# Patient Record
Sex: Female | Born: 1980
Health system: Southern US, Community
[De-identification: ages and names within clinical notes are randomized; demographics above are authoritative.]

## PROBLEM LIST (undated history)

## (undated) DIAGNOSIS — F4310 Post-traumatic stress disorder, unspecified: Secondary | ICD-10-CM

## (undated) DIAGNOSIS — K219 Gastro-esophageal reflux disease without esophagitis: Secondary | ICD-10-CM

## (undated) DIAGNOSIS — K5792 Diverticulitis of intestine, part unspecified, without perforation or abscess without bleeding: Secondary | ICD-10-CM

## (undated) DIAGNOSIS — F509 Eating disorder, unspecified: Secondary | ICD-10-CM

## (undated) DIAGNOSIS — F329 Major depressive disorder, single episode, unspecified: Secondary | ICD-10-CM

## (undated) DIAGNOSIS — F319 Bipolar disorder, unspecified: Secondary | ICD-10-CM

## (undated) DIAGNOSIS — F32A Depression, unspecified: Secondary | ICD-10-CM

## (undated) DIAGNOSIS — I10 Essential (primary) hypertension: Secondary | ICD-10-CM

## (undated) DIAGNOSIS — B019 Varicella without complication: Secondary | ICD-10-CM

## (undated) DIAGNOSIS — E119 Type 2 diabetes mellitus without complications: Secondary | ICD-10-CM

## (undated) DIAGNOSIS — A630 Anogenital (venereal) warts: Secondary | ICD-10-CM

## (undated) HISTORY — DX: Bipolar disorder, unspecified: F31.9

## (undated) HISTORY — DX: Major depressive disorder, single episode, unspecified: F32.9

## (undated) HISTORY — PX: TONSILLECTOMY AND ADENOIDECTOMY: SHX28

## (undated) HISTORY — DX: Type 2 diabetes mellitus without complications: E11.9

## (undated) HISTORY — DX: Eating disorder, unspecified: F50.9

## (undated) HISTORY — DX: Gastro-esophageal reflux disease without esophagitis: K21.9

## (undated) HISTORY — DX: Depression, unspecified: F32.A

## (undated) HISTORY — PX: WISDOM TOOTH EXTRACTION: SHX21

## (undated) HISTORY — DX: Essential (primary) hypertension: I10

## (undated) HISTORY — DX: Post-traumatic stress disorder, unspecified: F43.10

## (undated) HISTORY — DX: Varicella without complication: B01.9

## (undated) HISTORY — DX: Anogenital (venereal) warts: A63.0

## (undated) HISTORY — DX: Diverticulitis of intestine, part unspecified, without perforation or abscess without bleeding: K57.92

---

## 2003-05-19 ENCOUNTER — Other Ambulatory Visit: Payer: Self-pay

## 2008-01-20 ENCOUNTER — Emergency Department: Payer: Self-pay | Admitting: Emergency Medicine

## 2008-10-06 ENCOUNTER — Encounter: Payer: Self-pay | Admitting: Obstetrics & Gynecology

## 2008-10-06 ENCOUNTER — Ambulatory Visit: Payer: Self-pay | Admitting: Obstetrics & Gynecology

## 2008-10-06 LAB — CONVERTED CEMR LAB
Antibody Screen: NEGATIVE
Eosinophils Absolute: 0.1 10*3/uL (ref 0.0–0.7)
Eosinophils Relative: 1 % (ref 0–5)
HCT: 39.3 % (ref 36.0–46.0)
Hepatitis B Surface Ag: NEGATIVE
Lymphocytes Relative: 25 % (ref 12–46)
MCV: 87.9 fL (ref 78.0–100.0)
Monocytes Relative: 6 % (ref 3–12)
RBC: 4.47 M/uL (ref 3.87–5.11)
RDW: 12.9 % (ref 11.5–15.5)
WBC: 8 10*3/uL (ref 4.0–10.5)

## 2008-10-07 ENCOUNTER — Ambulatory Visit: Payer: Self-pay | Admitting: Obstetrics & Gynecology

## 2008-10-07 ENCOUNTER — Encounter: Payer: Self-pay | Admitting: Obstetrics & Gynecology

## 2008-10-12 ENCOUNTER — Encounter: Payer: Self-pay | Admitting: Obstetrics & Gynecology

## 2008-10-21 ENCOUNTER — Ambulatory Visit: Payer: Self-pay | Admitting: Obstetrics & Gynecology

## 2008-10-21 ENCOUNTER — Ambulatory Visit (HOSPITAL_COMMUNITY): Admission: RE | Admit: 2008-10-21 | Discharge: 2008-10-21 | Payer: Self-pay | Admitting: Obstetrics & Gynecology

## 2008-10-25 ENCOUNTER — Encounter: Payer: Self-pay | Admitting: Obstetrics & Gynecology

## 2008-10-25 ENCOUNTER — Ambulatory Visit: Payer: Self-pay | Admitting: Family Medicine

## 2008-10-25 LAB — CONVERTED CEMR LAB
ALT: 26 units/L (ref 0–35)
AST: 14 units/L (ref 0–37)
Albumin: 4.3 g/dL (ref 3.5–5.2)
Alkaline Phosphatase: 42 units/L (ref 39–117)
BUN: 8 mg/dL (ref 6–23)
CO2: 19 meq/L (ref 19–32)
Calcium: 9.5 mg/dL (ref 8.4–10.5)
Collection Interval-CRCL: 24 hr
Creatinine 24 HR UR: 1721 mg/24hr (ref 700–1800)
Creatinine Clearance: 210 mL/min — ABNORMAL HIGH (ref 75–115)
Creatinine, Ser: 0.57 mg/dL (ref 0.40–1.20)
Creatinine, Urine: 98.3 mg/dL
Glucose, Bld: 170 mg/dL — ABNORMAL HIGH (ref 70–99)
Hemoglobin: 13.1 g/dL (ref 12.0–15.0)
LDH: 134 units/L (ref 94–250)
MCHC: 33 g/dL (ref 30.0–36.0)
Potassium: 4.2 meq/L (ref 3.5–5.3)
Protein, Ur: 53 mg/24hr (ref 50–100)
Total Bilirubin: 0.3 mg/dL (ref 0.3–1.2)
Total Protein: 7.1 g/dL (ref 6.0–8.3)
Uric Acid, Serum: 4.3 mg/dL (ref 2.4–7.0)

## 2008-11-22 ENCOUNTER — Encounter: Admission: RE | Admit: 2008-11-22 | Discharge: 2008-11-22 | Payer: Self-pay | Admitting: Obstetrics & Gynecology

## 2008-11-22 ENCOUNTER — Ambulatory Visit (HOSPITAL_COMMUNITY): Admission: RE | Admit: 2008-11-22 | Discharge: 2008-11-22 | Payer: Self-pay | Admitting: Obstetrics & Gynecology

## 2008-12-13 ENCOUNTER — Ambulatory Visit (HOSPITAL_COMMUNITY): Admission: RE | Admit: 2008-12-13 | Discharge: 2008-12-13 | Payer: Self-pay | Admitting: Obstetrics & Gynecology

## 2008-12-16 ENCOUNTER — Ambulatory Visit: Payer: Self-pay | Admitting: Obstetrics and Gynecology

## 2009-01-03 ENCOUNTER — Ambulatory Visit (HOSPITAL_COMMUNITY): Admission: RE | Admit: 2009-01-03 | Discharge: 2009-01-03 | Payer: Self-pay | Admitting: Obstetrics & Gynecology

## 2009-01-13 ENCOUNTER — Ambulatory Visit: Payer: Self-pay | Admitting: Obstetrics and Gynecology

## 2009-01-31 ENCOUNTER — Ambulatory Visit (HOSPITAL_COMMUNITY): Admission: RE | Admit: 2009-01-31 | Discharge: 2009-01-31 | Payer: Self-pay | Admitting: Obstetrics & Gynecology

## 2009-02-10 ENCOUNTER — Ambulatory Visit: Payer: Self-pay | Admitting: Obstetrics & Gynecology

## 2009-02-17 ENCOUNTER — Encounter: Payer: Self-pay | Admitting: Obstetrics & Gynecology

## 2009-02-17 ENCOUNTER — Ambulatory Visit: Payer: Self-pay | Admitting: Obstetrics & Gynecology

## 2009-02-17 LAB — CONVERTED CEMR LAB: Glucose, Bld: 78 mg/dL (ref 70–99)

## 2009-02-28 ENCOUNTER — Ambulatory Visit: Payer: Self-pay | Admitting: Obstetrics and Gynecology

## 2009-02-28 ENCOUNTER — Ambulatory Visit (HOSPITAL_COMMUNITY): Admission: RE | Admit: 2009-02-28 | Discharge: 2009-02-28 | Payer: Self-pay | Admitting: Obstetrics & Gynecology

## 2009-03-02 ENCOUNTER — Ambulatory Visit: Payer: Self-pay | Admitting: Obstetrics & Gynecology

## 2009-03-21 ENCOUNTER — Ambulatory Visit: Payer: Self-pay | Admitting: Family Medicine

## 2009-03-21 LAB — CONVERTED CEMR LAB
Hemoglobin: 13 g/dL (ref 12.0–15.0)
Platelets: 256 10*3/uL (ref 150–400)
RBC: 4.32 M/uL (ref 3.87–5.11)
RDW: 13 % (ref 11.5–15.5)
WBC: 8.8 10*3/uL (ref 4.0–10.5)

## 2009-03-23 ENCOUNTER — Ambulatory Visit: Payer: Self-pay | Admitting: Obstetrics and Gynecology

## 2009-03-28 ENCOUNTER — Ambulatory Visit (HOSPITAL_COMMUNITY): Admission: RE | Admit: 2009-03-28 | Discharge: 2009-03-28 | Payer: Self-pay | Admitting: Obstetrics & Gynecology

## 2009-03-29 ENCOUNTER — Ambulatory Visit: Payer: Self-pay | Admitting: Family Medicine

## 2009-04-05 ENCOUNTER — Ambulatory Visit: Payer: Self-pay | Admitting: Obstetrics & Gynecology

## 2009-04-07 ENCOUNTER — Ambulatory Visit: Payer: Self-pay | Admitting: Obstetrics & Gynecology

## 2009-04-07 LAB — CONVERTED CEMR LAB
Calcium: 9.2 mg/dL (ref 8.4–10.5)
Chloride: 106 meq/L (ref 96–112)
Creatinine 24 HR UR: 2032 mg/24hr — ABNORMAL HIGH (ref 700–1800)
Creatinine Clearance: 288 mL/min — ABNORMAL HIGH (ref 75–115)
Creatinine, Urine: 99.1 mg/dL
HCT: 38 % (ref 36.0–46.0)
Hemoglobin: 12.8 g/dL (ref 12.0–15.0)
MCHC: 33.7 g/dL (ref 30.0–36.0)
Platelets: 226 10*3/uL (ref 150–400)
RBC: 4.26 M/uL (ref 3.87–5.11)
Total Bilirubin: 0.5 mg/dL (ref 0.3–1.2)
Total Protein: 6.6 g/dL (ref 6.0–8.3)
Uric Acid, Serum: 4.9 mg/dL (ref 2.4–7.0)
WBC: 7.3 10*3/uL (ref 4.0–10.5)

## 2009-04-11 ENCOUNTER — Ambulatory Visit: Payer: Self-pay | Admitting: Obstetrics and Gynecology

## 2009-04-19 ENCOUNTER — Ambulatory Visit: Payer: Self-pay | Admitting: Family Medicine

## 2009-04-21 ENCOUNTER — Ambulatory Visit: Payer: Self-pay | Admitting: Obstetrics & Gynecology

## 2009-04-22 ENCOUNTER — Encounter: Payer: Self-pay | Admitting: Obstetrics & Gynecology

## 2009-04-25 ENCOUNTER — Ambulatory Visit (HOSPITAL_COMMUNITY): Admission: RE | Admit: 2009-04-25 | Discharge: 2009-04-25 | Payer: Self-pay | Admitting: Obstetrics & Gynecology

## 2009-05-02 ENCOUNTER — Inpatient Hospital Stay (HOSPITAL_COMMUNITY): Admission: AD | Admit: 2009-05-02 | Discharge: 2009-05-02 | Payer: Self-pay | Admitting: Obstetrics & Gynecology

## 2009-05-02 ENCOUNTER — Ambulatory Visit: Payer: Self-pay | Admitting: Obstetrics and Gynecology

## 2009-05-02 LAB — CONVERTED CEMR LAB: Chlamydia, DNA Probe: NEGATIVE

## 2009-05-03 ENCOUNTER — Encounter: Payer: Self-pay | Admitting: Obstetrics and Gynecology

## 2009-05-04 ENCOUNTER — Ambulatory Visit: Payer: Self-pay | Admitting: Obstetrics and Gynecology

## 2009-05-04 ENCOUNTER — Encounter: Payer: Self-pay | Admitting: Obstetrics & Gynecology

## 2009-05-06 ENCOUNTER — Ambulatory Visit (HOSPITAL_COMMUNITY): Admission: RE | Admit: 2009-05-06 | Discharge: 2009-05-06 | Payer: Self-pay | Admitting: Obstetrics & Gynecology

## 2009-05-09 ENCOUNTER — Ambulatory Visit: Payer: Self-pay | Admitting: Obstetrics and Gynecology

## 2009-05-12 ENCOUNTER — Ambulatory Visit: Payer: Self-pay | Admitting: Family Medicine

## 2009-05-12 ENCOUNTER — Inpatient Hospital Stay (HOSPITAL_COMMUNITY): Admission: AD | Admit: 2009-05-12 | Discharge: 2009-05-14 | Payer: Self-pay | Admitting: Obstetrics and Gynecology

## 2009-07-04 ENCOUNTER — Ambulatory Visit: Admission: RE | Admit: 2009-07-04 | Discharge: 2009-07-04 | Payer: Self-pay | Admitting: Family Medicine

## 2009-07-14 ENCOUNTER — Ambulatory Visit: Payer: Self-pay | Admitting: Obstetrics & Gynecology

## 2010-02-02 ENCOUNTER — Emergency Department: Payer: Self-pay | Admitting: Emergency Medicine

## 2010-06-25 ENCOUNTER — Encounter: Payer: Self-pay | Admitting: Obstetrics & Gynecology

## 2010-09-05 LAB — COMPREHENSIVE METABOLIC PANEL
Albumin: 3.2 g/dL — ABNORMAL LOW (ref 3.5–5.2)
BUN: 10 mg/dL (ref 6–23)
Calcium: 9 mg/dL (ref 8.4–10.5)
Glucose, Bld: 101 mg/dL — ABNORMAL HIGH (ref 70–99)
Potassium: 3.9 mEq/L (ref 3.5–5.1)
Sodium: 132 mEq/L — ABNORMAL LOW (ref 135–145)
Total Protein: 6.6 g/dL (ref 6.0–8.3)

## 2010-09-05 LAB — URINALYSIS, DIPSTICK ONLY
Bilirubin Urine: NEGATIVE
Ketones, ur: NEGATIVE mg/dL
Leukocytes, UA: NEGATIVE
Nitrite: NEGATIVE
Specific Gravity, Urine: 1.03 (ref 1.005–1.030)
Urobilinogen, UA: 0.2 mg/dL (ref 0.0–1.0)
pH: 5.5 (ref 5.0–8.0)

## 2010-09-05 LAB — URIC ACID: Uric Acid, Serum: 5.6 mg/dL (ref 2.4–7.0)

## 2010-09-05 LAB — CBC
HCT: 41.8 % (ref 36.0–46.0)
MCV: 90.6 fL (ref 78.0–100.0)
RBC: 4.62 MIL/uL (ref 3.87–5.11)
RDW: 12.5 % (ref 11.5–15.5)
WBC: 10.1 10*3/uL (ref 4.0–10.5)

## 2010-09-05 LAB — RPR: RPR Ser Ql: NONREACTIVE

## 2010-09-05 LAB — GLUCOSE, CAPILLARY

## 2010-09-06 LAB — COMPREHENSIVE METABOLIC PANEL
ALT: 26 U/L (ref 0–35)
Alkaline Phosphatase: 93 U/L (ref 39–117)
BUN: 6 mg/dL (ref 6–23)
CO2: 21 mEq/L (ref 19–32)
Chloride: 108 mEq/L (ref 96–112)
Glucose, Bld: 84 mg/dL (ref 70–99)
Potassium: 3.9 mEq/L (ref 3.5–5.1)
Sodium: 136 mEq/L (ref 135–145)
Total Bilirubin: 0.2 mg/dL — ABNORMAL LOW (ref 0.3–1.2)
Total Protein: 6.4 g/dL (ref 6.0–8.3)

## 2010-09-06 LAB — URINALYSIS, ROUTINE W REFLEX MICROSCOPIC
Glucose, UA: NEGATIVE mg/dL
Nitrite: NEGATIVE
Specific Gravity, Urine: 1.01 (ref 1.005–1.030)
pH: 7 (ref 5.0–8.0)

## 2010-09-06 LAB — LACTATE DEHYDROGENASE: LDH: 124 U/L (ref 94–250)

## 2010-09-06 LAB — CBC
HCT: 39.1 % (ref 36.0–46.0)
Hemoglobin: 13.6 g/dL (ref 12.0–15.0)
RBC: 4.32 MIL/uL (ref 3.87–5.11)
WBC: 11 10*3/uL — ABNORMAL HIGH (ref 4.0–10.5)

## 2010-10-17 NOTE — Assessment & Plan Note (Signed)
Caitlin Gomez, SOLIZ               ACCOUNT NO.:  [id number]   MEDICAL RECORD NO.:  [id number]          PATIENT TYPE:  POB   LOCATION:  [facility] at [facility]         FACILITY:  [facility]   PHYSICIAN:  Scheryl Darter, MD       DATE OF BIRTH:  06-15-1980   DATE OF SERVICE:                                  CLINIC NOTE   The patient is postpartum from a vaginal delivery at 88 weeks on  July 13, 2009.  Infant is a female, 6 pounds 6 ounces.  The patient  was gestational diabetic.  She has been using a spermicide as a birth  control method.  She is not sure what other method, she would like to  use.  She has history of chronic hypertension.  She has some irregular  bleeding.  She is currently breast-feeding and working to increase her  milk production.  Infant is receiving some supplementation with formula.  She also reports some tingling and numbness in her back near where the  epidural was placed.  She says this started several weeks after  delivery.  Pain does not radiate.   PHYSICAL EXAMINATION:  BACK:  No redness or tenderness in her back.  ABDOMEN:  Soft, nontender, no mass.  PELVIC:  External genitalia, vagina, and cervix appeared normal.  Uterus  normal size.  No adnexal masses or tenderness.   IMPRESSION:  1. A 9 weeks postpartum.  2. Gestational diabetes.  3. History of chronic hypertension.   PLAN:  She will return for 2 hour 75 g load with good tolerance test.  She will need to be followed by primary care physician for her blood  pressure.  If she decides she would a like prescription contraceptives,  she can notify us.  In the meantime, I would recommend she use condoms  with spermicide.      Scheryl Darter, MD     JA/MEDQ  D:  07/14/2009  T:  07/15/2009  Job:  [id number]

## 2011-02-04 IMAGING — US US OB FOLLOW-UP
1 series · 14 of 26 positions shown · non-contrast
Comparison: none

OBSTETRICAL ULTRASOUND:
 This ultrasound was performed in The [HOSPITAL], and the [facility] report will be stored to [REDACTED] PACS.  This report is also available in [HOSPITAL]?s accessANYware.

[Series 1: us ob follow-up · 26 acquisitions, 14 frames shown]
[im 1/26]
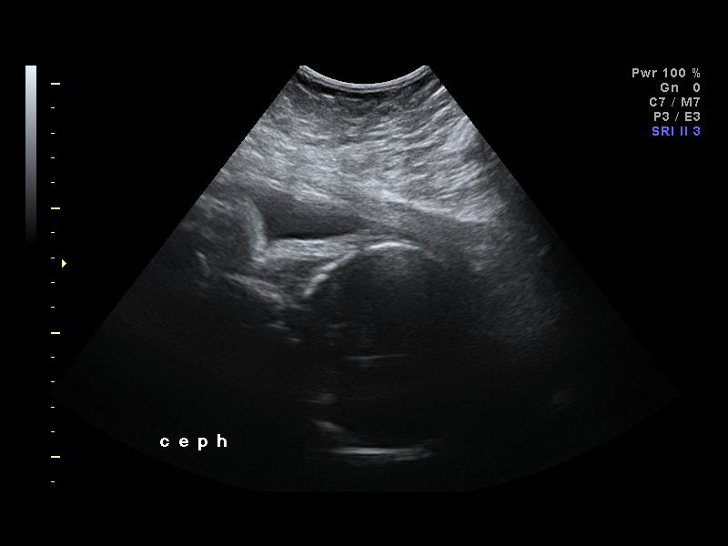
[im 3/26]
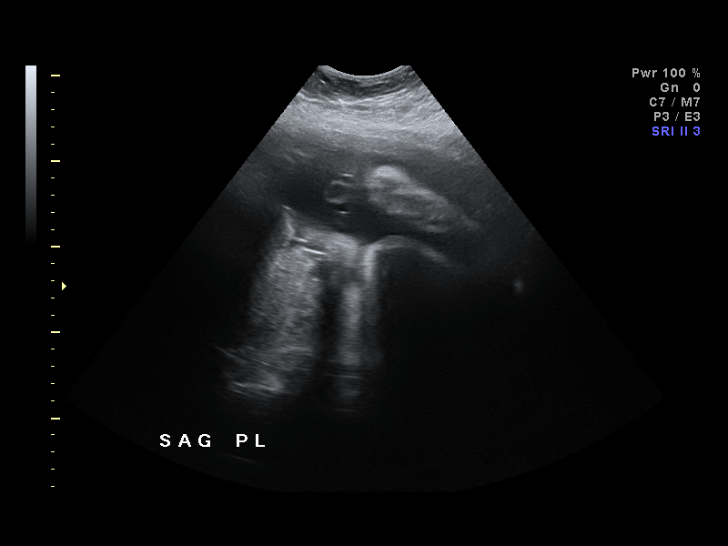
[im 5/26]
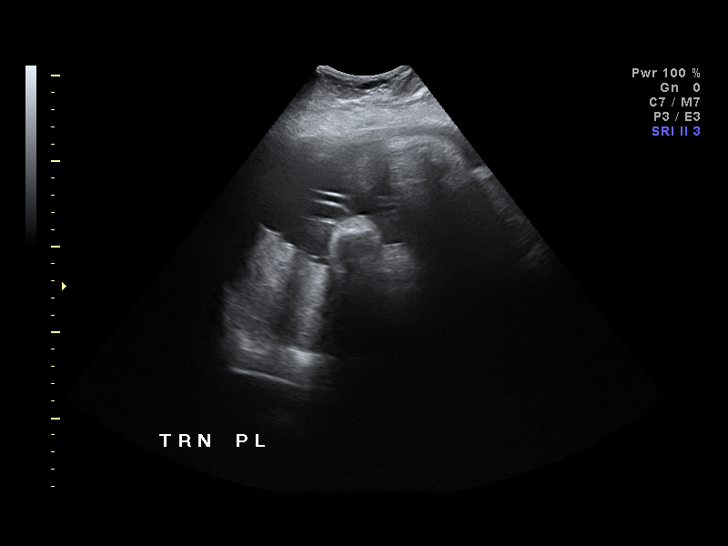
[im 7/26]
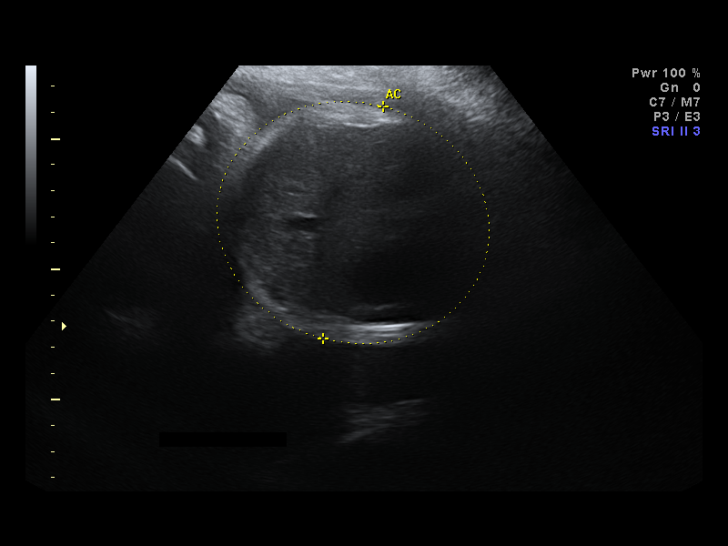
[im 9/26]
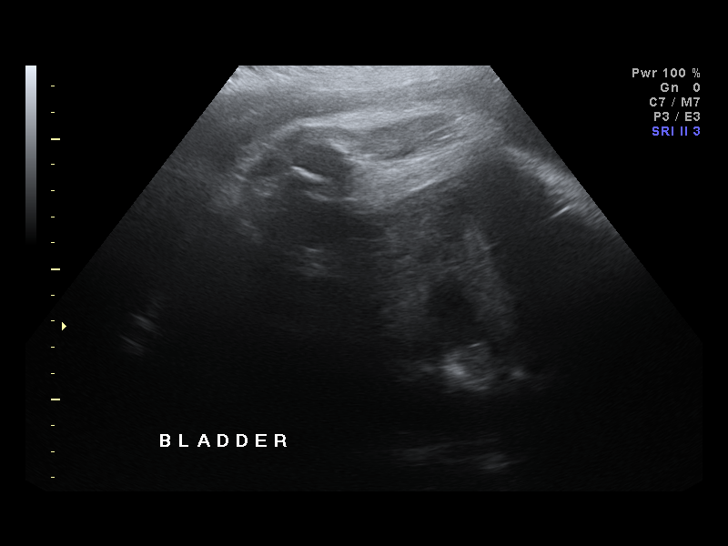
[im 11/26]
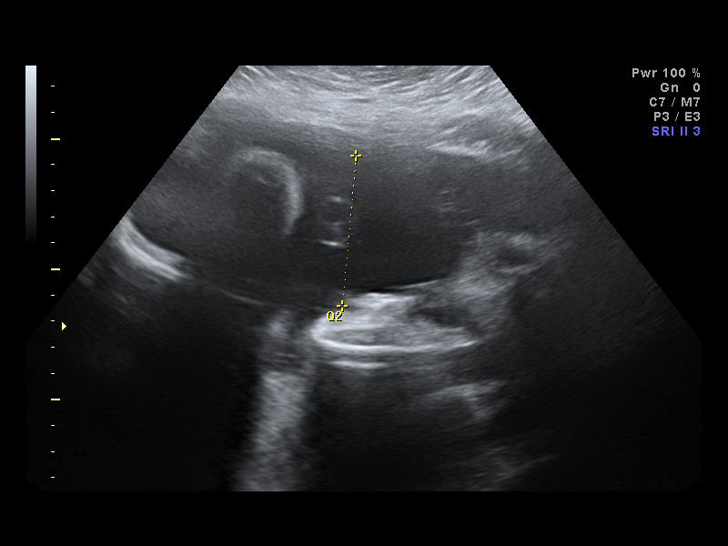
[im 13/26]
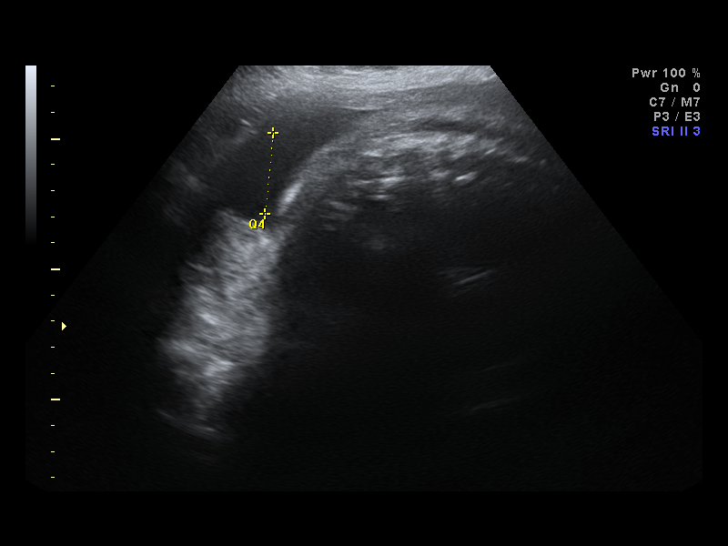
[im 14/26]
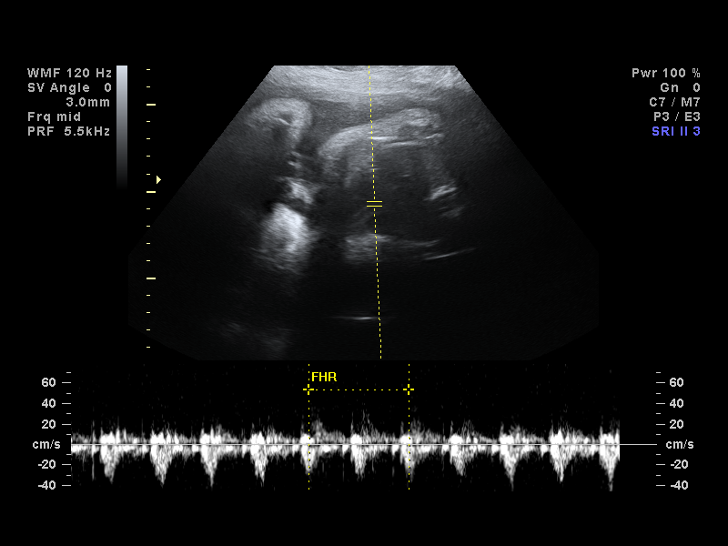
[im 16/26]
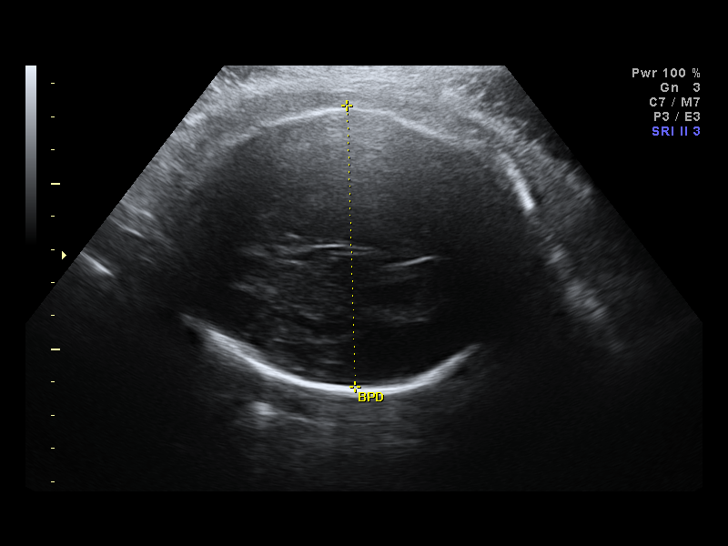
[im 18/26]
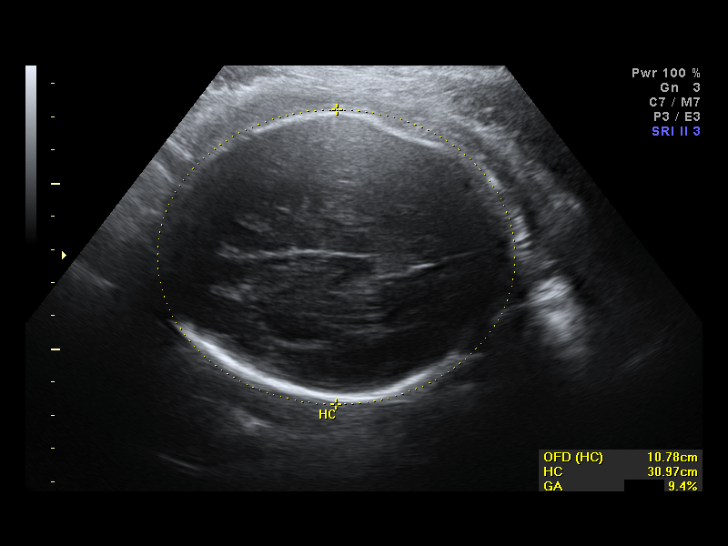
[im 20/26]
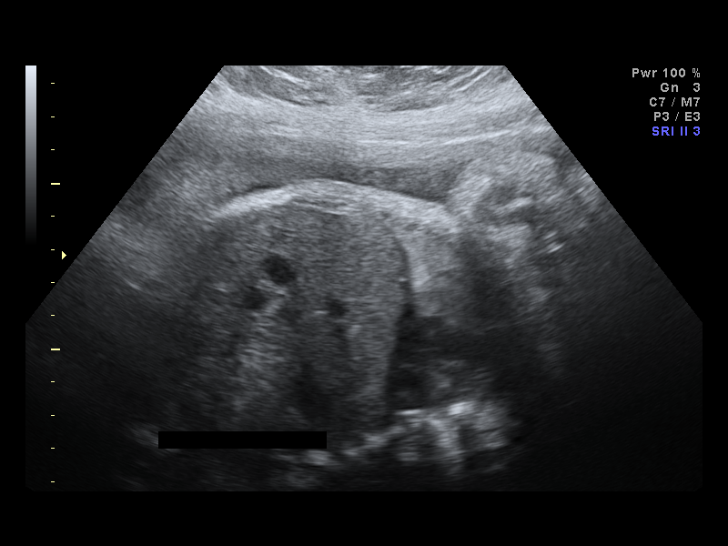
[im 22/26]
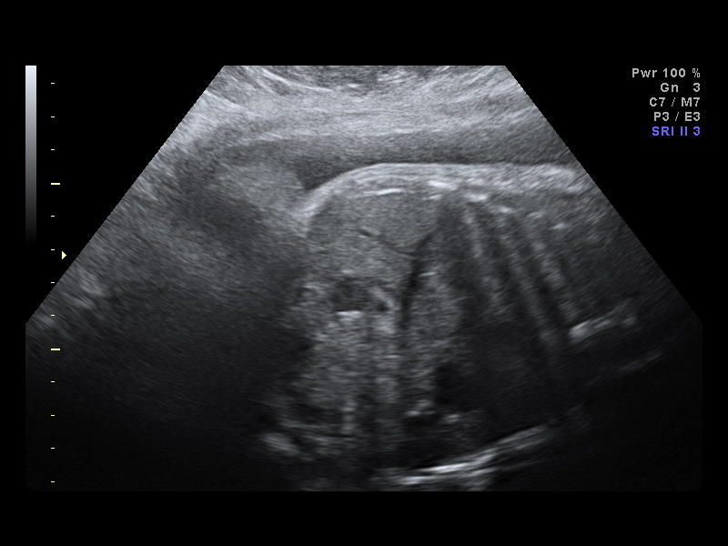
[im 24/26]
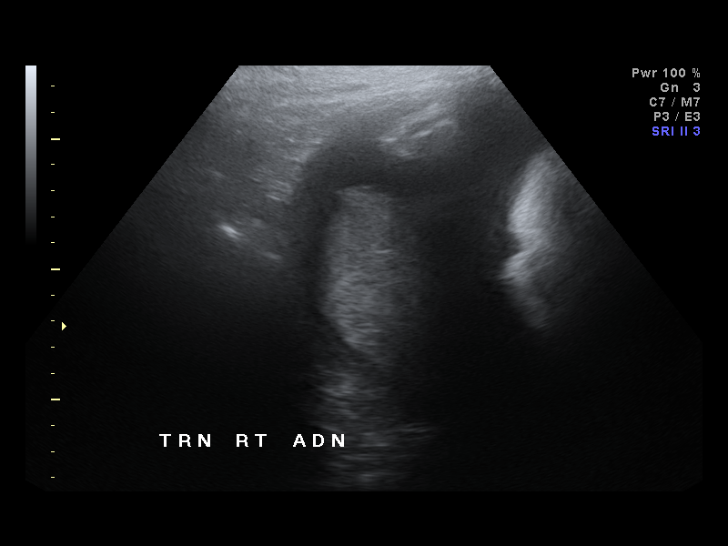
[im 26/26]
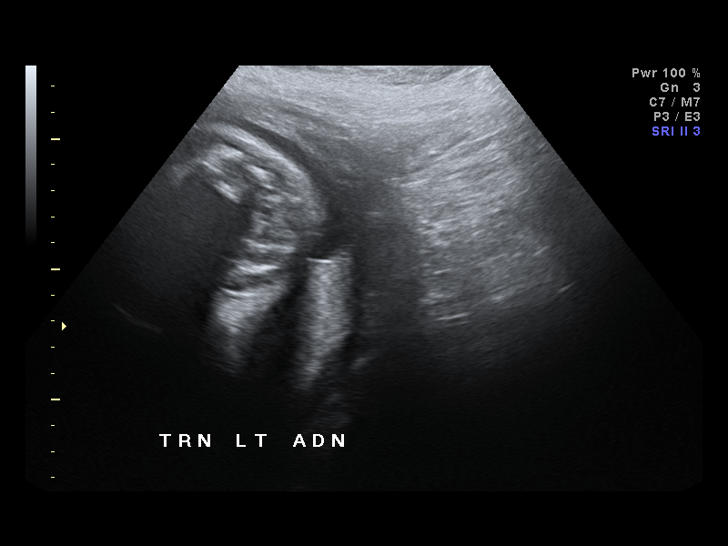

[14 of 26 positions shown; findings below may reference images not displayed]

IMPRESSION: [facility] has also been faxed to the ordering physician.

## 2012-03-06 SURGERY — Surgical Case
Anesthesia: *Unknown

## 2012-11-21 LAB — COMPREHENSIVE METABOLIC PANEL
Albumin: 4.3 g/dL (ref 3.4–5.0)
Alkaline Phosphatase: 55 U/L (ref 50–136)
BUN: 9 mg/dL (ref 7–18)
Calcium, Total: 9.1 mg/dL (ref 8.5–10.1)
Chloride: 105 mmol/L (ref 98–107)
Creatinine: 0.85 mg/dL (ref 0.60–1.30)
Glucose: 188 mg/dL — ABNORMAL HIGH (ref 65–99)
Osmolality: 279 (ref 275–301)
Potassium: 3.5 mmol/L (ref 3.5–5.1)
SGPT (ALT): 32 U/L (ref 12–78)
Sodium: 138 mmol/L (ref 136–145)

## 2012-11-21 LAB — DRUG SCREEN, URINE
Amphetamines, Ur Screen: NEGATIVE (ref ?–1000)
Cannabinoid 50 Ng, Ur ~~LOC~~: NEGATIVE (ref ?–50)
Opiate, Ur Screen: NEGATIVE (ref ?–300)
Phencyclidine (PCP) Ur S: NEGATIVE (ref ?–25)
Tricyclic, Ur Screen: NEGATIVE (ref ?–1000)

## 2012-11-21 LAB — URINALYSIS, COMPLETE
Blood: NEGATIVE
Glucose,UR: NEGATIVE mg/dL (ref 0–75)
Granular Cast: 5
Leukocyte Esterase: NEGATIVE
Nitrite: NEGATIVE
RBC,UR: <1 /HPF (ref 0–5)
Specific Gravity: 1.025 (ref 1.003–1.030)
Squamous Epithelial: <1

## 2012-11-21 LAB — CBC
HCT: 40.9 % (ref 35.0–47.0)
MCH: 29.7 pg (ref 26.0–34.0)
MCHC: 35.4 g/dL (ref 32.0–36.0)
Platelet: 284 10*3/uL (ref 150–440)
WBC: 8.5 10*3/uL (ref 3.6–11.0)

## 2012-11-21 LAB — ETHANOL
Ethanol %: <0.003 % (ref 0.000–0.080)
Ethanol: <3 mg/dL

## 2012-11-21 LAB — TSH: Thyroid Stimulating Horm: 1.14 u[IU]/mL

## 2012-11-22 ENCOUNTER — Inpatient Hospital Stay: Payer: Self-pay | Admitting: Psychiatry

## 2014-09-24 NOTE — Discharge Summary (Signed)
PATIENT NAME:  Caitlin Gomez, Caitlin Gomez MR#:  [id number] DATE OF BIRTH:  07-Mar-1981  DATE OF ADMISSION:  11/22/2012 DATE OF DISCHARGE:  11/25/2012  HOSPITAL COURSE:  See dictated history and physical for details of admission. A 34 year old woman with a history of depressive symptoms and borderline personality disorder features who was admitted to the hospital after presenting to her therapist with suicidal thoughts and thoughts of self-mutilation. In the hospital, the patient did not display any dangerous behavior. She expressed remorse for her behavior and expressed an understanding that her threatening behavior was counterproductive. She was engaged in groups on the unit appropriately. She did not display any dangerous behavior on the ward. The patient showed good insight and appeared to show appropriate response to psychotherapy. Medications were continued with the starter dose of lamotrigine which had been started by her outpatient psychiatrist. No side effects noted. At the time of discharge, the patient's affect was calm and upbeat. She was denying suicidal ideation and was very agreeable to outpatient treatment in the community. No acute dangerousness indicated.   DISCHARGE MEDICATION:  Lamotrigine 25 mg p.o. daily.   LABORATORY RESULTS:  Admission labs showed a chemistry panel with an elevated glucose on a nonfasting draw of 188. Alcohol was undetectable. TSH was normal at 1.14. The drug screen was negative. CBC was unremarkable. Urinalysis was positive for protein.   MENTAL STATUS EXAM AT DISCHARGE:  Neatly dressed and groomed woman looks her stated age. Cooperative with the interview. Good eye contact. Affect euthymic, reactive, appropriate. Mood stated as good. Thoughts lucid with no evidence of loosening of associations. Denies suicidal or homicidal ideation. Denies any hallucinations. Shows improved insight and judgment. Normal intelligence.   DISPOSITION:  She will be discharged with followup with Dr.  Nicolasa Ducking and her therapist, Karen San Marino, in the community.   DIAGNOSIS, PRINCIPAL AND PRIMARY:  AXIS I: Depression, not otherwise specified.   SECONDARY DIAGNOSES: AXIS I: Dysthymia.  AXIS II: Borderline personality disorder.  AXIS III: No diagnosis.  AXIS IV: Severe from chronic illness.  AXIS V: Functioning at time of discharge 57.   ____________________________ Gonzella Lex, MD jtc:si D: 11/25/2012 17:28:16 ET T: 11/25/2012 22:45:15 ET JOB#: [id number]  cc: Gonzella Lex, MD, <Dictator> Gonzella Lex MD ELECTRONICALLY SIGNED 11/26/2012 10:40

## 2014-09-24 NOTE — H&P (Signed)
PATIENT NAME:  Caitlin Gomez, POUSSON MR#:  [id number] DATE OF BIRTH:  08/27/1980  DATE OF ADMISSION:  11/22/2012  PLACE OF DICTATION:  [place], [place], [place].  SEX:  Female.  RACE:  White.  AGE:  34 years.  INITIAL ASSESSMENT:  Psychiatric evaluation.  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  The patient is married for 4 years and lives with her husband who is 30 years old and is a Geophysical data processor.  The patient and husband live in a house along with their child who is a 28-year-old girl.  The patient comes for first inpatient psychiatry at [facility] with chief complaint "Dr. Nicolasa Ducking recommended that I should come here for help."  HISTORY OF PRESENT ILLNESS:  The patient reports that she is being followed by Dr.  Richardson Dopp from November 2006 to March of 2009 and was diagnosed with borderline personality disorder.  In addition, she goes for therapy with Ms. Nira Retort since March of 2014 and currently she gets to see her once a week for an hour for therapy.  She expressed her feelings with the therapist and she has self-cutting behavior since age 48 years and recently she used a razor to cut herself on the left side of her chest.  They discussed her case and Dr. Nicolasa Ducking recommended that she should come here for inpatient evaluation.  The patient reports that whenever she feels stressed out and overwhelmed and she cannot deal with the anger issues of other people, she tries to release her frustration by self-cutting behavior.    PAST PSYCHIATRIC HISTORY:  This is her first inpatient psychiatry.  No history of suicide attempt.  Being followed for borderline personality disorder for many years.    FAMILY HISTORY OF MENTAL ILLNESS:  Mother has depression.  Maternal grandmother committed suicide.    FAMILY HISTORY:  Raised by  parents.  Father is a truck Geophysicist/field seismologist and father is 72 years old.  Mother works at a [employer]..  Mother is 32 years old.  The patient is an only child.  PERSONAL HISTORY:  Born in [place], moved to [place] at age 89 years, graduated from high school, took some college courses.    WORK HISTORY:  First job was at a pharmacy at age 35 years.  The longest job she has held is 3 years at a pharmacy and became a Freight forwarder.  Last worked in 2007 and quit because she had a baby.    MILITARY HISTORY:  None.  MARRIAGES:  Married once.  Married for 4 years.  Husband works as a Geophysical data processor, has one girl who is 32 years old.   ALCOHOL AND DRUGS:  First drink was at 22 yrs used to drink quite a bit of alcohol at age 14 years.  Currently is an occasional drinker of alcohol.  Denies street and prescription drug abuse.  Denies using IV drugs.  Denies smoking nicotine cigarettes.   PAST MEDICAL HISTORY:  Has hypertension which is labile.  No known diabetes mellitus.  Status post tonsillectomy and adenoidectomy at age 36 years.  No history of motor accident.  Never been unconsciousness.    ALLERGIES:  AUGMENTIN.  APPOINTMENTS:  Has an appointment coming up at [facility] on 11/26/2012 for medical help.  PHYSICAL EXAMINATION: VITAL SIGNS:  Temperature 98.4, pulse is 90 per  minute and regular, respirations 20 per minute and regular, blood pressure is 150/90 mmHg. HEENT:  Head is normocephalic, atraumatic.  Eyes, PERRLA.  Fundi bilaterally benign.  EOMs were intact.  Tympanic membranes intact, no exudate. NECK:  Supple without any organomegaly, lymphadenopathy or thyromegaly. CHEST:  Normal expansion.  Normal breath sounds heard.  The patient has superficial abrasions on the left side of her abdomen and chest which are self-inflicted with a razor and they are very superficial.   ABDOMEN:  Rather obese.  Bowel sounds heard.  RECTAL AND PELVIC:  Deferred. NEUROLOGIC:  Gait is normal.  Romberg is  negative.  Cranial nerves II through XII grossly intact.  DTRs 2+ and normal.  Plantars have normal response.  MENTAL STATUS EXAMINATION:  The patient is dressed in hospital scrubs.  Alert and oriented to place, person and time.  Fully aware of situation that brought her for admission to [facility].  Affect is reactive with mood depressed.  Admits feeling low and down.  Admits that she is feeling hopeless and helpless because she is here.  She wishes she was at home with her child.  Denies feeling worthless or useless.  Denies any suicidal or homicidal ideas or plans, as she just gets frustrated when she is overwhelmed and tries to release her frustration and anger by self-mutilation.  No psychosis.  Denies auditory or visual hallucinations.  Denies hearing voices, denies  thought control.  Denies having any grandiose ideas.  Memory is intact for recent and remote events.  Recall is good.  Could spell the word world forward and backward without any problem.  Judgment is intact and for fire she says she will leave. For envelope, she said she would mail it.  Denies any sleep disturbance.  Denies any appetite disturbance.  Insight and judgement guarded.   IMPRESSION:  Depressive disorder, not otherwise specified, borderline personality disorder with low frustration tolerance and when she gets upset she uses self-mutilating behavior.  Exogenous obesity mild.  Labile hypertension.  PLAN:  The patient is admitted to [facility], close observation and management.  She will be started on as needed medications.  During the stay in the hospital, she will be given milieu therapy and supportive counseling with dealing with frustration tolerance and coping skills will be addressed.  At the time of discharge, the patient will have better coping skills and will be referred back to the community for follow-up and help.    ____________________________ Wallace Cullens. Franchot Mimes, MD skc:ea D: 11/22/2012 19:02:50  ET T: 11/22/2012 19:32:00 ET JOB#: [id number]  cc: Arlyn Leak K. Franchot Mimes, MD, <Dictator> Dewain Penning MD ELECTRONICALLY SIGNED 11/23/2012 18:22

## 2015-03-21 ENCOUNTER — Other Ambulatory Visit: Payer: Self-pay | Admitting: Physician Assistant

## 2015-03-21 DIAGNOSIS — R945 Abnormal results of liver function studies: Principal | ICD-10-CM

## 2015-03-21 DIAGNOSIS — R7989 Other specified abnormal findings of blood chemistry: Secondary | ICD-10-CM

## 2015-03-25 ENCOUNTER — Ambulatory Visit
Admission: RE | Admit: 2015-03-25 | Discharge: 2015-03-25 | Disposition: A | Discharge status: Home / Self Care | Payer: BLUE CROSS/BLUE SHIELD | Source: Ambulatory Visit | Attending: Physician Assistant | Admitting: Physician Assistant

## 2015-03-25 DIAGNOSIS — R945 Abnormal results of liver function studies: Secondary | ICD-10-CM

## 2015-03-25 DIAGNOSIS — R7989 Other specified abnormal findings of blood chemistry: Secondary | ICD-10-CM | POA: Diagnosis not present

## 2015-07-14 LAB — HM PAP SMEAR: HM PAP: NEGATIVE

## 2016-03-05 ENCOUNTER — Ambulatory Visit (INDEPENDENT_AMBULATORY_CARE_PROVIDER_SITE_OTHER): Payer: BLUE CROSS/BLUE SHIELD | Admitting: Family

## 2016-03-05 ENCOUNTER — Encounter: Payer: Self-pay | Admitting: Family

## 2016-03-05 ENCOUNTER — Encounter (INDEPENDENT_AMBULATORY_CARE_PROVIDER_SITE_OTHER): Payer: Self-pay

## 2016-03-05 DIAGNOSIS — Z23 Encounter for immunization: Secondary | ICD-10-CM

## 2016-03-05 DIAGNOSIS — F319 Bipolar disorder, unspecified: Secondary | ICD-10-CM | POA: Diagnosis not present

## 2016-03-05 DIAGNOSIS — I10 Essential (primary) hypertension: Secondary | ICD-10-CM

## 2016-03-05 DIAGNOSIS — E119 Type 2 diabetes mellitus without complications: Secondary | ICD-10-CM

## 2016-03-05 DIAGNOSIS — Z7689 Persons encountering health services in other specified circumstances: Secondary | ICD-10-CM | POA: Diagnosis not present

## 2016-03-05 LAB — CBC WITH DIFFERENTIAL/PLATELET
BASOS ABS: 0.1 10*3/uL (ref 0.0–0.1)
Basophils Relative: 0.9 % (ref 0.0–3.0)
EOS ABS: 0.3 10*3/uL (ref 0.0–0.7)
Eosinophils Relative: 3.7 % (ref 0.0–5.0)
HEMATOCRIT: 38.3 % (ref 36.0–46.0)
Hemoglobin: 13.3 g/dL (ref 12.0–15.0)
LYMPHS ABS: 1.7 10*3/uL (ref 0.7–4.0)
LYMPHS PCT: 22.3 % (ref 12.0–46.0)
MCHC: 34.8 g/dL (ref 30.0–36.0)
MCV: 85.7 fl (ref 78.0–100.0)
MONO ABS: 0.5 10*3/uL (ref 0.1–1.0)
Monocytes Relative: 6.6 % (ref 3.0–12.0)
NEUTROS ABS: 5.1 10*3/uL (ref 1.4–7.7)
NEUTROS PCT: 66.5 % (ref 43.0–77.0)
PLATELETS: 252 10*3/uL (ref 150.0–400.0)
RBC: 4.47 Mil/uL (ref 3.87–5.11)
RDW: 12.9 % (ref 11.5–15.5)
WBC: 7.7 10*3/uL (ref 4.0–10.5)

## 2016-03-05 LAB — TSH: TSH: 2.19 u[IU]/mL (ref 0.35–4.50)

## 2016-03-05 LAB — LIPID PANEL
CHOL/HDL RATIO: 6
Cholesterol: 183 mg/dL (ref 0–200)
HDL: 32.9 mg/dL — ABNORMAL LOW (ref >39.00)
LDL CALC: 114 mg/dL — AB (ref 0–99)
NonHDL: 150.48
Triglycerides: 184 mg/dL — ABNORMAL HIGH (ref 0.0–149.0)
VLDL: 36.8 mg/dL (ref 0.0–40.0)

## 2016-03-05 LAB — COMPREHENSIVE METABOLIC PANEL
ALT: 19 U/L (ref 0–35)
AST: 13 U/L (ref 0–37)
Albumin: 3.9 g/dL (ref 3.5–5.2)
Alkaline Phosphatase: 41 U/L (ref 39–117)
BILIRUBIN TOTAL: 0.3 mg/dL (ref 0.2–1.2)
BUN: 9 mg/dL (ref 6–23)
CHLORIDE: 109 meq/L (ref 96–112)
CO2: 22 meq/L (ref 19–32)
CREATININE: 0.77 mg/dL (ref 0.40–1.20)
Calcium: 8.9 mg/dL (ref 8.4–10.5)
GFR: 91.46 mL/min (ref >60.00)
GLUCOSE: 165 mg/dL — AB (ref 70–99)
Potassium: 4.1 mEq/L (ref 3.5–5.1)
SODIUM: 139 meq/L (ref 135–145)
Total Protein: 7.3 g/dL (ref 6.0–8.3)

## 2016-03-05 LAB — VITAMIN D 25 HYDROXY (VIT D DEFICIENCY, FRACTURES): VITD: 19.8 ng/mL — AB (ref 30.00–100.00)

## 2016-03-05 LAB — HEMOGLOBIN A1C: Hgb A1c MFr Bld: 6.8 % — ABNORMAL HIGH (ref 4.6–6.5)

## 2016-03-05 NOTE — Assessment & Plan Note (Signed)
Reviewed past medical, social, family history with patient. We are requesting records from Dr. Corena Pilgrim, psychiatrist, and prior PCP from [place].  Per patient, she's had a physical this year including Pap smear which was normal. She no history of abnormal Pap smears.  Patient politely declined scheduling a CPE this calendar year, screening labs ordered today.

## 2016-03-05 NOTE — Progress Notes (Signed)
Pre visit review using our clinic review tool, if applicable. No additional management support is needed unless otherwise documented below in the visit note. 

## 2016-03-05 NOTE — Assessment & Plan Note (Addendum)
Appears stable. Follows with dr Corena Pilgrim in [place]. Requesting records. Titrating down on topamax and plans to restart lamictal.

## 2016-03-05 NOTE — Progress Notes (Signed)
Subjective:    Patient ID: Caitlin Gomez, female    DOB: 29-Apr-1981, 35 y.o.   MRN: [id number]  CC: Caitlin Gomez is a 35 y.o. female who presents today as new patient.  HPI: Patient presents today as a new patient to our practice. She is here to establish care. Doing well today no complaints.  Prior care had been at the [facility] clinic and we are requesting those records. 'Whole family works at [employer]' which is why she wanted to come here.   DM- last a1c from [place] 7.5. Not checking blood sugars currently at home.   HTN-Can be labile at times; started on medication at 35 years old. No chest pain, palpitations.  Bipolar - Dr. Corena Pilgrim in [place]. Tapering off topamax, had been on lamictal and now going back on. Has gained 10 pounds on topamax, and also noted BP running higher.    Pap 2017, normal.  States she had CPE and CBE this year.          HISTORY:  Past Medical History:  Diagnosis Date  . Chicken pox   . Depression   . Diabetes mellitus without complication ([place])   . Diverticulitis    12/2015  . Eating disorder   . Genital warts   . GERD (gastroesophageal reflux disease)   . Hypertension    Past Surgical History:  Procedure Laterality Date  . TONSILLECTOMY AND ADENOIDECTOMY     Family History  Problem Relation Age of Onset  . Alcohol abuse Mother   . Hyperlipidemia Mother   . Hypertension Mother   . Mental illness Mother   . Diabetes Mother   . Diabetes Father   . Mental illness Paternal Aunt   . Cancer Paternal Aunt   . Early death Maternal Grandmother   . Mental illness Maternal Grandmother   . Diabetes Maternal Grandmother   . Diabetes Maternal Grandfather   . Stroke Paternal Grandfather   . Diabetes Paternal Grandfather     Allergies: Amoxicillin; Ciprofloxacin; Clindamycin; Flagyl [metronidazole]; Ziprasidone hcl; and Amoxicillin-pot clavulanate No current outpatient prescriptions on file prior to visit.   No current  facility-administered medications on file prior to visit.     Social History  Substance Use Topics  . Smoking status: Former Smoker    Types: Cigarettes  . Smokeless tobacco: Never Used  . Alcohol use Yes     Comment: one glass of wine once every 3 months    Review of Systems  Constitutional: Negative for chills and fever.  Eyes: Negative for visual disturbance.  Respiratory: Negative for cough.   Cardiovascular: Negative for chest pain and palpitations.  Gastrointestinal: Negative for nausea and vomiting.  Psychiatric/Behavioral: Negative for agitation and hallucinations.      Objective:    BP (!) 130/100   Pulse 98   Temp 98 F (36.7 C) (Oral)   Ht '5\' 6"'$  (1.676 m)   Wt 263 lb 12.8 oz (119.7 kg)   LMP  (LMP Unknown)   SpO2 99%   BMI 42.58 kg/m  BP Readings from Last 3 Encounters:  03/05/16 (!) 130/100   Wt Readings from Last 3 Encounters:  03/05/16 263 lb 12.8 oz (119.7 kg)    Physical Exam  Constitutional: She appears well-developed and well-nourished.  Eyes: Conjunctivae are normal.  Neck: No thyroid mass and no thyromegaly present.  Cardiovascular: Normal rate, regular rhythm, normal heart sounds and normal pulses.   Pulmonary/Chest: Effort normal and breath sounds normal. She has no wheezes. She  has no rhonchi. She has no rales.  Lymphadenopathy:       Head (right side): No submental, no submandibular, no tonsillar, no preauricular, no posterior auricular and no occipital adenopathy present.       Head (left side): No submental, no submandibular, no tonsillar, no preauricular, no posterior auricular and no occipital adenopathy present.    She has no cervical adenopathy.  Neurological: She is alert.  Skin: Skin is warm and dry.  Psychiatric: She has a normal mood and affect. Her speech is normal and behavior is normal. Thought content normal. Her mood appears not anxious. Her affect is not blunt and not inappropriate.  Vitals reviewed.      Assessment &  Plan:   Problem List Items Addressed This Visit      Cardiovascular and Mediastinum   Essential hypertension    Controlled on current regimen. Pending BMP.       Relevant Medications   lisinopril (PRINIVIL,ZESTRIL) 10 MG tablet     Endocrine   Diabetes ([place])    On metformin, glipizide. Pending A1C today.       Relevant Medications   glipiZIDE (GLUCOTROL) 10 MG tablet   lisinopril (PRINIVIL,ZESTRIL) 10 MG tablet   metFORMIN (GLUCOPHAGE) 500 MG tablet     Other   Bipolar disorder (HCC)    Appears stable. Follows with dr Corena Pilgrim in [place]. Requesting records. Titrating down on topamax and plans to restart lamictal.       Encounter to establish care    Reviewed past medical, social, family history with patient. We are requesting records from Dr. Corena Pilgrim, psychiatrist, and prior PCP from [place].  Per patient, she's had a physical this year including Pap smear which was normal. She no history of abnormal Pap smears.  Patient politely declined scheduling a CPE this calendar year, screening labs ordered today.       Relevant Orders   CBC with Differential/Platelet   Comprehensive metabolic panel   Hemoglobin A1c   Lipid panel   TSH   VITAMIN D 25 Hydroxy (Vit-D Deficiency, Fractures)    Other Visit Diagnoses   None.      I am having Ms. Mally maintain her glucose blood, FIFTY50 GLUCOSE METER 2.0, cetirizine, glipiZIDE, lisinopril, metFORMIN, and topiramate.   Meds ordered this encounter  Medications  . glucose blood (BAYER CONTOUR NEXT TEST) test strip    Sig: Use 2 (two) times daily.  . Blood Glucose Monitoring Suppl (FIFTY50 GLUCOSE METER 2.0) w/Device KIT    Sig: Use as directed.  . cetirizine (ZYRTEC) 10 MG tablet    Sig: Take by mouth.  Marland Kitchen glipiZIDE (GLUCOTROL) 10 MG tablet    Sig: TAKE 1 TABLET BY MOUTH 2 TIMES A DAY BEFORE MEALS  . lisinopril (PRINIVIL,ZESTRIL) 10 MG tablet    Sig: Take by mouth.  . metFORMIN (GLUCOPHAGE) 500 MG tablet    Sig: Take by  mouth.  . topiramate (TOPAMAX) 25 MG tablet    Sig: Take by mouth.    Return precautions given.   Risks, benefits, and alternatives of the medications and treatment plan prescribed today were discussed, and patient expressed understanding.   Education regarding symptom management and diagnosis given to patient on AVS.  Continue to follow with Mable Paris, FNP for routine health maintenance.   Caitlin Gomez and I agreed with plan.   Mable Paris, FNP

## 2016-03-05 NOTE — Assessment & Plan Note (Signed)
Controlled on current regimen. Pending BMP.

## 2016-03-05 NOTE — Patient Instructions (Addendum)
Pleasure meeting you.  Basic Carbohydrate Counting for Diabetes Mellitus Carbohydrate counting is a method for keeping track of the amount of carbohydrates you eat. Eating carbohydrates naturally increases the level of sugar (glucose) in your blood, so it is important for you to know the amount that is okay for you to have in every meal. Carbohydrate counting helps keep the level of glucose in your blood within normal limits. The amount of carbohydrates allowed is different for every person. A dietitian can help you calculate the amount that is right for you. Once you know the amount of carbohydrates you can have, you can count the carbohydrates in the foods you want to eat. Carbohydrates are found in the following foods:  Grains, such as breads and cereals.  Dried beans and soy products.  Starchy vegetables, such as potatoes, peas, and corn.  Fruit and fruit juices.  Milk and yogurt.  Sweets and snack foods, such as cake, cookies, candy, chips, soft drinks, and fruit drinks. CARBOHYDRATE COUNTING There are two ways to count the carbohydrates in your food. You can use either of the methods or a combination of both. Reading the "Nutrition Facts" on Packaged Food The "Nutrition Facts" is an area that is included on the labels of almost all packaged food and beverages in the [place]. It includes the serving size of that food or beverage and information about the nutrients in each serving of the food, including the grams (g) of carbohydrate per serving.  Decide the number of servings of this food or beverage that you will be able to eat or drink. Multiply that number of servings by the number of grams of carbohydrate that is listed on the label for that serving. The total will be the amount of carbohydrates you will be having when you eat or drink this food or beverage. Learning Standard Serving Sizes of Food When you eat food that is not packaged or does not include "Nutrition Facts" on the  label, you need to measure the servings in order to count the amount of carbohydrates.A serving of most carbohydrate-rich foods contains about 15 g of carbohydrates. The following list includes serving sizes of carbohydrate-rich foods that provide 15 g ofcarbohydrate per serving:   1 slice of bread (1 oz) or 1 six-inch tortilla.    of a hamburger bun or English muffin.  4-6 crackers.   cup unsweetened dry cereal.    cup hot cereal.   cup rice or pasta.    cup mashed potatoes or  of a large baked potato.  1 cup fresh fruit or one small piece of fruit.    cup canned or frozen fruit or fruit juice.  1 cup milk.   cup plain fat-free yogurt or yogurt sweetened with artificial sweeteners.   cup cooked dried beans or starchy vegetable, such as peas, corn, or potatoes.  Decide the number of standard-size servings that you will eat. Multiply that number of servings by 15 (the grams of carbohydrates in that serving). For example, if you eat 2 cups of strawberries, you will have eaten 2 servings and 30 g of carbohydrates (2 servings x 15 g = 30 g). For foods such as soups and casseroles, in which more than one food is mixed in, you will need to count the carbohydrates in each food that is included. EXAMPLE OF CARBOHYDRATE COUNTING Sample Dinner  3 oz chicken breast.   cup of brown rice.   cup of corn.  1 cup milk.     1 cup strawberries with sugar-free whipped topping.  Carbohydrate Calculation Step 1: Identify the foods that contain carbohydrates:   Rice.   Corn.   Milk.   Strawberries. Step 2:Calculate the number of servings eaten of each:   2 servings of rice.   1 serving of corn.   1 serving of milk.   1 serving of strawberries. Step 3: Multiply each of those number of servings by 15 g:   2 servings of rice x 15 g = 30 g.   1 serving of corn x 15 g = 15 g.   1 serving of milk x 15 g = 15 g.   1 serving of strawberries x 15 g = 15  g. Step 4: Add together all of the amounts to find the total grams of carbohydrates eaten: 30 g + 15 g + 15 g + 15 g = 75 g.   This information is not intended to replace advice given to you by your health care provider. Make sure you discuss any questions you have with your health care provider.   Document Released: 05/21/2005 Document Revised: 06/11/2014 Document Reviewed: 04/17/2013 Elsevier Interactive Patient Education 2016 [employer]vier Inc.  

## 2016-03-05 NOTE — Assessment & Plan Note (Signed)
On metformin, glipizide. Pending A1C today.

## 2016-03-08 ENCOUNTER — Encounter: Payer: Self-pay | Admitting: Family

## 2016-03-08 MED ORDER — LISINOPRIL 10 MG PO TABS: 10.0000 mg | ORAL_TABLET | Freq: Every day | ORAL | 1 refills | Status: DC

## 2016-03-08 MED ORDER — METFORMIN HCL 500 MG PO TABS: 500.0000 mg | ORAL_TABLET | Freq: Every day | ORAL | 1 refills | Status: DC

## 2016-03-08 MED ORDER — GLIPIZIDE 10 MG PO TABS: ORAL_TABLET | ORAL | 1 refills | Status: DC

## 2016-03-08 NOTE — Telephone Encounter (Signed)
Medication has been refilled.

## 2016-03-12 ENCOUNTER — Encounter: Payer: Self-pay | Admitting: Family

## 2016-03-13 ENCOUNTER — Other Ambulatory Visit: Payer: Self-pay

## 2016-03-13 MED ORDER — LISINOPRIL 10 MG PO TABS: 10.0000 mg | ORAL_TABLET | Freq: Every day | ORAL | 2 refills | Status: DC

## 2016-03-13 MED ORDER — METFORMIN HCL 500 MG PO TABS: 500.0000 mg | ORAL_TABLET | Freq: Every day | ORAL | 2 refills | Status: DC

## 2016-03-13 MED ORDER — GLIPIZIDE 10 MG PO TABS: ORAL_TABLET | ORAL | 2 refills | Status: DC

## 2016-03-13 NOTE — Telephone Encounter (Signed)
Medication has been refilled.

## 2016-03-29 ENCOUNTER — Ambulatory Visit: Payer: BLUE CROSS/BLUE SHIELD | Admitting: Family

## 2016-04-11 ENCOUNTER — Encounter: Payer: Self-pay | Admitting: Family

## 2016-04-11 ENCOUNTER — Ambulatory Visit (INDEPENDENT_AMBULATORY_CARE_PROVIDER_SITE_OTHER): Payer: BLUE CROSS/BLUE SHIELD | Admitting: Family

## 2016-04-11 VITALS — BP 124/82 | HR 105 | Temp 98.4°F | Resp 18 | Wt 263.2 lb

## 2016-04-11 DIAGNOSIS — H66003 Acute suppurative otitis media without spontaneous rupture of ear drum, bilateral: Secondary | ICD-10-CM | POA: Diagnosis not present

## 2016-04-11 MED ORDER — AZITHROMYCIN 250 MG PO TABS: ORAL_TABLET | ORAL | 0 refills | Status: DC

## 2016-04-11 NOTE — Progress Notes (Signed)
Subjective:    Patient ID: Caitlin Gomez, female    DOB: 1981/02/28, 35 y.o.   MRN: [id number]  CC: [name] is a 35 y.o. female who presents today for an acute visit.    HPI: Acute visit with CC: sore throat, cough and left ear pain for 2 weeks, waxing and waning. Endorses sinus pressure and nasal congestion. Tactile fever, chills. No wheezing, SOB, HA. Tried dayquil and nyquil with some relief. Has young daughter who has been sick as well.   New patient to practice at last visit. Still awaiting for prior records. Continues to follow with pyshciatry, Dr Corena Pilgrim every 2 months. She will bring her latest records to her next visit.      HISTORY:  Past Medical History:  Diagnosis Date  . Chicken pox   . Depression   . Diabetes mellitus without complication ([place])   . Diverticulitis    12/2015  . Eating disorder   . Genital warts   . GERD (gastroesophageal reflux disease)   . Hypertension    Past Surgical History:  Procedure Laterality Date  . TONSILLECTOMY AND ADENOIDECTOMY     Family History  Problem Relation Age of Onset  . Alcohol abuse Mother   . Hyperlipidemia Mother   . Hypertension Mother   . Mental illness Mother   . Diabetes Mother   . Diabetes Father   . Mental illness Paternal Aunt   . Cancer Paternal Aunt   . Early death Maternal Grandmother   . Mental illness Maternal Grandmother   . Diabetes Maternal Grandmother   . Diabetes Maternal Grandfather   . Stroke Paternal Grandfather   . Diabetes Paternal Grandfather     Allergies: Amoxicillin; Ciprofloxacin; Clindamycin; Flagyl [metronidazole]; Ziprasidone hcl; and Amoxicillin-pot clavulanate Current Outpatient Prescriptions on File Prior to Visit  Medication Sig Dispense Refill  . Blood Glucose Monitoring Suppl (FIFTY50 GLUCOSE METER 2.0) w/Device KIT Use as directed.    Marland Kitchen glipiZIDE (GLUCOTROL) 10 MG tablet TAKE 1 TABLET BY MOUTH 2 TIMES A DAY BEFORE MEALS 180 tablet 2  . glucose blood (BAYER  CONTOUR NEXT TEST) test strip Use 2 (two) times daily.    Marland Kitchen lisinopril (PRINIVIL,ZESTRIL) 10 MG tablet Take 1 tablet (10 mg total) by mouth daily. 90 tablet 2  . metFORMIN (GLUCOPHAGE) 500 MG tablet Take 1 tablet (500 mg total) by mouth daily with breakfast. 90 tablet 2  . topiramate (TOPAMAX) 25 MG tablet Take by mouth.    . cetirizine (ZYRTEC) 10 MG tablet Take by mouth.     No current facility-administered medications on file prior to visit.     Social History  Substance Use Topics  . Smoking status: Former Smoker    Types: Cigarettes  . Smokeless tobacco: Never Used  . Alcohol use Yes     Comment: one glass of wine once every 3 months    Review of Systems  Constitutional: Positive for chills and fever (tactile warm).  HENT: Positive for congestion, ear pain, sinus pain, sinus pressure and sore throat.   Respiratory: Positive for cough. Negative for shortness of breath and wheezing.   Cardiovascular: Negative for chest pain and palpitations.  Gastrointestinal: Negative for nausea and vomiting.      Objective:    BP 124/82 (BP Location: Left Arm, Patient Position: Sitting, Cuff Size: Large)   Pulse (!) 105   Temp 98.4 F (36.9 C) (Oral)   Resp 18   Wt 263 lb 4 oz (119.4 kg)  SpO2 98%   BMI 42.49 kg/m    Physical Exam  Constitutional: She appears well-developed and well-nourished.  HENT:  Head: Normocephalic and atraumatic.  Right Ear: Hearing, external ear and ear canal normal. No drainage, swelling or tenderness. No foreign bodies. Tympanic membrane is erythematous and bulging. A middle ear effusion is present. No decreased hearing is noted.  Left Ear: Hearing, external ear and ear canal normal. No drainage, swelling or tenderness. No foreign bodies. Tympanic membrane is erythematous and bulging. A middle ear effusion is present. No decreased hearing is noted.  Nose: Nose normal. No rhinorrhea. Right sinus exhibits no maxillary sinus tenderness and no frontal sinus  tenderness. Left sinus exhibits no maxillary sinus tenderness and no frontal sinus tenderness.  Mouth/Throat: Uvula is midline, oropharynx is clear and moist and mucous membranes are normal. No oropharyngeal exudate, posterior oropharyngeal edema, posterior oropharyngeal erythema or tonsillar abscesses.  Eyes: Conjunctivae are normal.  Cardiovascular: Regular rhythm, normal heart sounds and normal pulses.   Pulmonary/Chest: Effort normal and breath sounds normal. She has no wheezes. She has no rhonchi. She has no rales.  Lymphadenopathy:       Head (right side): No submental, no submandibular, no tonsillar, no preauricular, no posterior auricular and no occipital adenopathy present.       Head (left side): No submental, no submandibular, no tonsillar, no preauricular, no posterior auricular and no occipital adenopathy present.    She has no cervical adenopathy.  Neurological: She is alert.  Skin: Skin is warm and dry.  Psychiatric: She has a normal mood and affect. Her speech is normal and behavior is normal. Thought content normal.  Vitals reviewed.      Assessment & Plan:  1. Acute suppurative otitis media of both ears without spontaneous rupture of tympanic membranes, recurrence not specified Afebrile. PCN allergic. Start azithromycin. Return precautions given.   - azithromycin (ZITHROMAX) 250 MG tablet; Tale 500 mg PO on day 1, then 250 mg PO q24h x 4 days.  Dispense: 6 tablet; Refill: 0     I am having Ms. Carnevale start on azithromycin. I am also having her maintain her glucose blood, FIFTY50 GLUCOSE METER 2.0, cetirizine, topiramate, lisinopril, glipiZIDE, metFORMIN, and lamoTRIgine.   Meds ordered this encounter  Medications  . lamoTRIgine (LAMICTAL) 100 MG tablet    Sig: Take 100 mg by mouth 2 (two) times daily.  Marland Kitchen azithromycin (ZITHROMAX) 250 MG tablet    Sig: Tale 500 mg PO on day 1, then 250 mg PO q24h x 4 days.    Dispense:  6 tablet    Refill:  0    Order Specific  Question:   Supervising Provider    Answer:   Crecencio Mc [[id number]]    Return precautions given.   Risks, benefits, and alternatives of the medications and treatment plan prescribed today were discussed, and patient expressed understanding.   Education regarding symptom management and diagnosis given to patient on AVS.  Continue to follow with Mable Paris, FNP for routine health maintenance.   Caitlin Gomez and I agreed with plan.   Mable Paris, FNP

## 2016-04-11 NOTE — Patient Instructions (Signed)
Azithromycin  If there is no improvement in your symptoms, or if there is any worsening of symptoms, or if you have any additional concerns, please return for re-evaluation; or, if we are closed, consider going to the Emergency Room for evaluation if symptoms urgent.  Otitis Media With Effusion Otitis media with effusion is the presence of fluid in the middle ear. This is a common problem in children, which often follows ear infections. It may be present for weeks or longer after the infection. Unlike an acute ear infection, otitis media with effusion refers only to fluid behind the ear drum and not infection. Children with repeated ear and sinus infections and allergy problems are the most likely to get otitis media with effusion. CAUSES  The most frequent cause of the fluid buildup is dysfunction of the eustachian tubes. These are the tubes that drain fluid in the ears to the back of the nose (nasopharynx). SYMPTOMS   The main symptom of this condition is hearing loss. As a result, you or your child may:  Listen to the TV at a loud volume.  Not respond to questions.  Ask "what" often when spoken to.  Mistake or confuse one sound or word for another.  There may be a sensation of fullness or pressure but usually not pain. DIAGNOSIS   Your health care provider will diagnose this condition by examining you or your child's ears.  Your health care provider may test the pressure in you or your child's ear with a tympanometer.  A hearing test may be conducted if the problem persists. TREATMENT   Treatment depends on the duration and the effects of the effusion.  Antibiotics, decongestants, nose drops, and cortisone-type drugs (tablets or nasal spray) may not be helpful.  Children with persistent ear effusions may have delayed language or behavioral problems. Children at risk for developmental delays in hearing, learning, and speech may require referral to a specialist earlier than  children not at risk.  You or your child's health care provider may suggest a referral to an ear, nose, and throat surgeon for treatment. The following may help restore normal hearing:  Drainage of fluid.  Placement of ear tubes (tympanostomy tubes).  Removal of adenoids (adenoidectomy). HOME CARE INSTRUCTIONS   Avoid secondhand smoke.  Infants who are breastfed are less likely to have this condition.  Avoid feeding infants while they are lying flat.  Avoid known environmental allergens.  Avoid people who are sick. SEEK MEDICAL CARE IF:   Hearing is not better in 3 months.  Hearing is worse.  Ear pain.  Drainage from the ear.  Dizziness. MAKE SURE YOU:   Understand these instructions.  Will watch your condition.  Will get help right away if you are not doing well or get worse.   This information is not intended to replace advice given to you by your health care provider. Make sure you discuss any questions you have with your health care provider.   Document Released: 06/28/2004 Document Revised: 06/11/2014 Document Reviewed: 12/16/2012 Elsevier Interactive Patient Education [employer].

## 2016-04-16 ENCOUNTER — Other Ambulatory Visit: Payer: Self-pay | Admitting: Family

## 2016-04-16 ENCOUNTER — Encounter: Payer: Self-pay | Admitting: Family

## 2016-04-16 DIAGNOSIS — T3695XA Adverse effect of unspecified systemic antibiotic, initial encounter: Principal | ICD-10-CM

## 2016-04-16 DIAGNOSIS — B379 Candidiasis, unspecified: Secondary | ICD-10-CM

## 2016-04-16 MED ORDER — FLUCONAZOLE 150 MG PO TABS: 150.0000 mg | ORAL_TABLET | Freq: Once | ORAL | 1 refills | Status: AC

## 2016-04-19 ENCOUNTER — Encounter: Payer: Self-pay | Admitting: Family

## 2016-04-23 ENCOUNTER — Ambulatory Visit (INDEPENDENT_AMBULATORY_CARE_PROVIDER_SITE_OTHER): Payer: BLUE CROSS/BLUE SHIELD | Admitting: Family

## 2016-04-23 ENCOUNTER — Encounter: Payer: Self-pay | Admitting: Family

## 2016-04-23 VITALS — BP 138/90 | HR 88 | Temp 98.2°F | Ht 66.0 in | Wt 267.0 lb

## 2016-04-23 DIAGNOSIS — N939 Abnormal uterine and vaginal bleeding, unspecified: Secondary | ICD-10-CM

## 2016-04-23 LAB — POCT URINE PREGNANCY: PREG TEST UR: NEGATIVE

## 2016-04-23 NOTE — Progress Notes (Signed)
Subjective:    Patient ID: Caitlin Gomez, female    DOB: 10/17/1980, 35 y.o.   MRN: [id number]  CC: BIVIANA SADDLER is a 35 y.o. female who presents today for an acute visit.    HPI: Patient here for acute visit with chief complaint of vaginal bleeding 3 days ago which resolved 2 days ago. Wanted to cancel appointment however worried about a fee. Patient was seen last week for sinusitis and started on azithromycin. She also had a concurrent yeast infection and started on Diflucan which has since resolved.   Has very regular menstual periods. Saw blood on toilet paper and wore a panty liner that day. Endorses mild cramps that day which have also resolved.  No blood in urine or coming from rectum. No h.o hemorrhoids. No pelvic pain or pain with intercourse.  No dizzy. Normal pap per patient 07/2015. ( still waiting on records).  Took 2 doses of diflucan with improvement. No concern for STDs. Possible for pregnancy.     HISTORY:  Past Medical History:  Diagnosis Date  . Chicken pox   . Depression   . Diabetes mellitus without complication ([place])   . Diverticulitis    12/2015  . Eating disorder   . Genital warts   . GERD (gastroesophageal reflux disease)   . Hypertension    Past Surgical History:  Procedure Laterality Date  . TONSILLECTOMY AND ADENOIDECTOMY     Family History  Problem Relation Age of Onset  . Alcohol abuse Mother   . Hyperlipidemia Mother   . Hypertension Mother   . Mental illness Mother   . Diabetes Mother   . Diabetes Father   . Mental illness Paternal Aunt   . Cancer Paternal Aunt   . Early death Maternal Grandmother   . Mental illness Maternal Grandmother   . Diabetes Maternal Grandmother   . Diabetes Maternal Grandfather   . Stroke Paternal Grandfather   . Diabetes Paternal Grandfather     Allergies: Amoxicillin; Ciprofloxacin; Clindamycin; Flagyl [metronidazole]; Ziprasidone hcl; and Amoxicillin-pot clavulanate Current Outpatient Prescriptions  on File Prior to Visit  Medication Sig Dispense Refill  . azithromycin (ZITHROMAX) 250 MG tablet Tale 500 mg PO on day 1, then 250 mg PO q24h x 4 days. 6 tablet 0  . Blood Glucose Monitoring Suppl (FIFTY50 GLUCOSE METER 2.0) w/Device KIT Use as directed.    . cetirizine (ZYRTEC) 10 MG tablet Take by mouth.    Marland Kitchen glipiZIDE (GLUCOTROL) 10 MG tablet TAKE 1 TABLET BY MOUTH 2 TIMES A DAY BEFORE MEALS 180 tablet 2  . glucose blood (BAYER CONTOUR NEXT TEST) test strip Use 2 (two) times daily.    Marland Kitchen lamoTRIgine (LAMICTAL) 100 MG tablet Take 100 mg by mouth 2 (two) times daily.    Marland Kitchen lisinopril (PRINIVIL,ZESTRIL) 10 MG tablet Take 1 tablet (10 mg total) by mouth daily. 90 tablet 2  . metFORMIN (GLUCOPHAGE) 500 MG tablet Take 1 tablet (500 mg total) by mouth daily with breakfast. 90 tablet 2   No current facility-administered medications on file prior to visit.     Social History  Substance Use Topics  . Smoking status: Former Smoker    Types: Cigarettes  . Smokeless tobacco: Never Used  . Alcohol use Yes     Comment: one glass of wine once every 3 months    Review of Systems  Constitutional: Negative for chills and fever.  Respiratory: Negative for cough.   Cardiovascular: Negative for chest pain and palpitations.  Gastrointestinal: Negative for abdominal pain, nausea and vomiting.  Genitourinary: Positive for vaginal bleeding. Negative for dysuria, pelvic pain, vaginal discharge and vaginal pain.      Objective:    BP 138/90   Pulse 88   Temp 98.2 F (36.8 C) (Oral)   Ht _0  (1.676 m)   Wt 267 lb (121.1 kg)   LMP 04/18/2016   SpO2 98%   BMI 43.09 kg/m    Physical Exam  Constitutional: She appears well-developed and well-nourished.  Eyes: Conjunctivae are normal.  Cardiovascular: Normal rate, regular rhythm, normal heart sounds and normal pulses.   Pulmonary/Chest: Effort normal and breath sounds normal. She has no wheezes. She has no rhonchi. She has no rales.  Neurological:  She is alert.  Skin: Skin is warm and dry.  Psychiatric: She has a normal mood and affect. Her speech is normal and behavior is normal. Thought content normal.  Vitals reviewed.      Assessment & Plan:   1. Abnormal uterine bleeding Etiology of bleeding episode unclear at this point. Resolved. Patient politely declines pelvic exam or transvaginal ultrasound as symptoms have resolved. We jointly agreed if bleeding recurs, we will do both. We discussed possible differentials including endometrial polyps, fibroids, or ovulatory dysfunction. Considering recent weight gain may be playing a role.   - POCT urine pregnancy    I have discontinued Ms. Cwikla's topiramate. I am also having her maintain her glucose blood, FIFTY50 GLUCOSE METER 2.0, cetirizine, lisinopril, glipiZIDE, metFORMIN, lamoTRIgine, and azithromycin.   No orders of the defined types were placed in this encounter.   Return precautions given.   Risks, benefits, and alternatives of the medications and treatment plan prescribed today were discussed, and patient expressed understanding.   Education regarding symptom management and diagnosis given to patient on AVS.  Continue to follow with Mable Paris, FNP for routine health maintenance.   Caitlin Gomez and I agreed with plan.   Mable Paris, FNP

## 2016-04-23 NOTE — Progress Notes (Signed)
Pre visit review using our clinic review tool, if applicable. No additional management support is needed unless otherwise documented below in the visit note. 

## 2016-04-23 NOTE — Patient Instructions (Signed)
Pleasure seeing you. As discussed and based on your preference, if vaginal bleeding recurs, we will do pelvic exam and transvaginal ultrasound.  HappyThanksgiving!

## 2016-06-06 ENCOUNTER — Ambulatory Visit (INDEPENDENT_AMBULATORY_CARE_PROVIDER_SITE_OTHER): Payer: BLUE CROSS/BLUE SHIELD | Admitting: Family

## 2016-06-06 ENCOUNTER — Encounter: Payer: Self-pay | Admitting: Family

## 2016-06-06 VITALS — BP 142/88 | HR 100 | Temp 97.9°F | Ht 66.0 in | Wt 269.0 lb

## 2016-06-06 DIAGNOSIS — R21 Rash and other nonspecific skin eruption: Secondary | ICD-10-CM | POA: Diagnosis not present

## 2016-06-06 DIAGNOSIS — E119 Type 2 diabetes mellitus without complications: Secondary | ICD-10-CM

## 2016-06-06 LAB — COMPREHENSIVE METABOLIC PANEL
ALT: 36 U/L — AB (ref 0–35)
AST: 24 U/L (ref 0–37)
Albumin: 4.3 g/dL (ref 3.5–5.2)
Alkaline Phosphatase: 47 U/L (ref 39–117)
BILIRUBIN TOTAL: 0.4 mg/dL (ref 0.2–1.2)
BUN: 8 mg/dL (ref 6–23)
CALCIUM: 9.2 mg/dL (ref 8.4–10.5)
CHLORIDE: 104 meq/L (ref 96–112)
CO2: 26 meq/L (ref 19–32)
CREATININE: 0.82 mg/dL (ref 0.40–1.20)
GFR: 84.92 mL/min (ref >60.00)
GLUCOSE: 185 mg/dL — AB (ref 70–99)
Potassium: 4.5 mEq/L (ref 3.5–5.1)
Sodium: 138 mEq/L (ref 135–145)
Total Protein: 6.9 g/dL (ref 6.0–8.3)

## 2016-06-06 LAB — HEMOGLOBIN A1C: Hgb A1c MFr Bld: 7.6 % — ABNORMAL HIGH (ref 4.6–6.5)

## 2016-06-06 NOTE — Assessment & Plan Note (Signed)
Pending a1c, cmp. Has been on glipizide in the past which she didn't like. We discussed trying victoza if DM uncontrolled on metformin.

## 2016-06-06 NOTE — Assessment & Plan Note (Addendum)
New. Nonspecific at this point. Highly allergic patient. Failed antihistamines. Suspect environmental allergies or stress. Would like formal allergy testing. Referral placed. Will follow.

## 2016-06-06 NOTE — Progress Notes (Signed)
Subjective:    Patient ID: Caitlin Gomez, female    DOB: 1980/09/07, 36 y.o.   MRN: [id number]  CC: Caitlin Gomez is a 36 y.o. female who presents today for follow up.   HPI: Rash will appear after itching 2 months ago, coming more frequently now. Goes away in 30 minutes. No dry skin. Skin 'starts randomly itching.'Can cause it to 'happen'. Similar episode last year and thought related to stress. Tried hydroxyine, benadryl, zyrtec without relief.   Lots of allergies; seasonal allergies. Tried benadryl, zyrtec with minimal relief.  No SOB, tongue swelling.    DM- compliant with metformin. In past states elevated LFTs and would like to have them checked on medication.  Appetite increased when off topamax.       HISTORY:  Past Medical History:  Diagnosis Date  . Chicken pox   . Depression   . Diabetes mellitus without complication ([place])   . Diverticulitis    12/2015  . Eating disorder   . Genital warts   . GERD (gastroesophageal reflux disease)   . Hypertension    Past Surgical History:  Procedure Laterality Date  . TONSILLECTOMY AND ADENOIDECTOMY     Family History  Problem Relation Age of Onset  . Alcohol abuse Mother   . Hyperlipidemia Mother   . Hypertension Mother   . Mental illness Mother   . Diabetes Mother   . Diabetes Father   . Mental illness Paternal Aunt   . Cancer Paternal Aunt   . Early death Maternal Grandmother   . Mental illness Maternal Grandmother   . Diabetes Maternal Grandmother   . Diabetes Maternal Grandfather   . Stroke Paternal Grandfather   . Diabetes Paternal Grandfather     Allergies: Amoxicillin; Ciprofloxacin; Clindamycin; Flagyl [metronidazole]; Irene Limbo; Oysters [shellfish allergy]; Ziprasidone hcl; and Amoxicillin-pot clavulanate Current Outpatient Prescriptions on File Prior to Visit  Medication Sig Dispense Refill  . azithromycin (ZITHROMAX) 250 MG tablet Tale 500 mg PO on day 1, then 250 mg PO q24h x 4 days. 6 tablet 0  . Blood  Glucose Monitoring Suppl (FIFTY50 GLUCOSE METER 2.0) w/Device KIT Use as directed.    . cetirizine (ZYRTEC) 10 MG tablet Take by mouth.    Marland Kitchen glipiZIDE (GLUCOTROL) 10 MG tablet TAKE 1 TABLET BY MOUTH 2 TIMES A DAY BEFORE MEALS 180 tablet 2  . glucose blood (BAYER CONTOUR NEXT TEST) test strip Use 2 (two) times daily.    Marland Kitchen lamoTRIgine (LAMICTAL) 100 MG tablet Take 100 mg by mouth 2 (two) times daily.    Marland Kitchen lisinopril (PRINIVIL,ZESTRIL) 10 MG tablet Take 1 tablet (10 mg total) by mouth daily. 90 tablet 2  . metFORMIN (GLUCOPHAGE) 500 MG tablet Take 1 tablet (500 mg total) by mouth daily with breakfast. 90 tablet 2   No current facility-administered medications on file prior to visit.     Social History  Substance Use Topics  . Smoking status: Former Smoker    Types: Cigarettes  . Smokeless tobacco: Never Used  . Alcohol use Yes     Comment: one glass of wine once every 3 months    Review of Systems  Constitutional: Negative for chills and fever.  Respiratory: Negative for cough, chest tightness and shortness of breath.   Cardiovascular: Negative for chest pain and palpitations.  Gastrointestinal: Negative for nausea and vomiting.  Skin: Positive for rash.      Objective:    BP (!) 142/88   Pulse 100   Temp 97.9  F (36.6 C) (Oral)   Ht '5\' 6"'$  (1.676 m)   Wt 269 lb (122 kg)   SpO2 98%   BMI 43.42 kg/m  BP Readings from Last 3 Encounters:  06/06/16 (!) 142/88  04/23/16 138/90  04/11/16 124/82   Wt Readings from Last 3 Encounters:  06/06/16 269 lb (122 kg)  04/23/16 267 lb (121.1 kg)  04/11/16 263 lb 4 oz (119.4 kg)    Physical Exam  Constitutional: She appears well-developed and well-nourished.  Eyes: Conjunctivae are normal.  Cardiovascular: Normal rate, regular rhythm, normal heart sounds and normal pulses.   Pulmonary/Chest: Effort normal and breath sounds normal. She has no wheezes. She has no rhonchi. She has no rales.  Neurological: She is alert.  Skin: Skin is  warm and dry. There is erythema.  Erythematous excoriated area right ventral aspect forearm. No increased warmth, discharge.  Psychiatric: She has a normal mood and affect. Her speech is normal and behavior is normal. Thought content normal.  Vitals reviewed.      Assessment & Plan:   Problem List Items Addressed This Visit      Endocrine   Diabetes ([place]) - Primary    Pending a1c, cmp. Has been on glipizide in the past which she didn't like. We discussed trying victoza if DM uncontrolled on metformin.       Relevant Orders   Comprehensive metabolic panel   Hemoglobin A1c     Musculoskeletal and Integument   Rash and nonspecific skin eruption    Nonspecific at this point. Highly allergic patient. Suspect environmental allergies or stress. Would like formal allergy testing. Referral placed. Will follow.       Relevant Orders   Ambulatory referral to Allergy       I am having Ms. Dapper maintain her glucose blood, FIFTY50 GLUCOSE METER 2.0, cetirizine, lisinopril, glipiZIDE, metFORMIN, lamoTRIgine, and azithromycin.   No orders of the defined types were placed in this encounter.   Return precautions given.   Risks, benefits, and alternatives of the medications and treatment plan prescribed today were discussed, and patient expressed understanding.   Education regarding symptom management and diagnosis given to patient on AVS.  Continue to follow with Mable Paris, FNP for routine health maintenance.   Caitlin Gomez and I agreed with plan.   Mable Paris, FNP

## 2016-06-06 NOTE — Progress Notes (Signed)
Pre visit review using our clinic review tool, if applicable. No additional management support is needed unless otherwise documented below in the visit note. 

## 2016-06-06 NOTE — Patient Instructions (Signed)
Labs Referral to ent  If there is no improvement in your symptoms, or if there is any worsening of symptoms, or if you have any additional concerns, please return for re-evaluation; or, if we are closed, consider going to the Emergency Room for evaluation if symptoms urgent.

## 2016-06-10 ENCOUNTER — Encounter: Payer: Self-pay | Admitting: Family

## 2016-06-10 DIAGNOSIS — K760 Fatty (change of) liver, not elsewhere classified: Secondary | ICD-10-CM | POA: Insufficient documentation

## 2016-06-11 ENCOUNTER — Telehealth: Payer: Self-pay | Admitting: Family

## 2016-06-11 DIAGNOSIS — E119 Type 2 diabetes mellitus without complications: Secondary | ICD-10-CM

## 2016-06-11 MED ORDER — METFORMIN HCL 500 MG PO TABS: 500.0000 mg | ORAL_TABLET | Freq: Two times a day (BID) | ORAL | 2 refills | Status: DC

## 2016-06-11 NOTE — Telephone Encounter (Signed)
closing

## 2016-06-28 ENCOUNTER — Encounter: Payer: Self-pay | Admitting: Family

## 2016-07-20 ENCOUNTER — Encounter: Payer: Self-pay | Admitting: Family

## 2016-07-24 ENCOUNTER — Other Ambulatory Visit: Payer: Self-pay | Admitting: Family

## 2016-07-24 DIAGNOSIS — I10 Essential (primary) hypertension: Secondary | ICD-10-CM

## 2016-07-24 MED ORDER — LOSARTAN POTASSIUM 25 MG PO TABS: 25.0000 mg | ORAL_TABLET | Freq: Every day | ORAL | 0 refills | Status: DC

## 2016-08-06 ENCOUNTER — Ambulatory Visit
Admission: RE | Admit: 2016-08-06 | Discharge: 2016-08-06 | Disposition: A | Discharge status: Home / Self Care | Payer: BLUE CROSS/BLUE SHIELD | Source: Ambulatory Visit | Attending: Family | Admitting: Family

## 2016-08-06 ENCOUNTER — Encounter: Payer: Self-pay | Admitting: Family

## 2016-08-06 ENCOUNTER — Ambulatory Visit (INDEPENDENT_AMBULATORY_CARE_PROVIDER_SITE_OTHER): Payer: BLUE CROSS/BLUE SHIELD | Admitting: Family

## 2016-08-06 DIAGNOSIS — K429 Umbilical hernia without obstruction or gangrene: Secondary | ICD-10-CM | POA: Diagnosis not present

## 2016-08-06 DIAGNOSIS — K5790 Diverticulosis of intestine, part unspecified, without perforation or abscess without bleeding: Secondary | ICD-10-CM | POA: Diagnosis not present

## 2016-08-06 DIAGNOSIS — R1030 Lower abdominal pain, unspecified: Secondary | ICD-10-CM | POA: Insufficient documentation

## 2016-08-06 DIAGNOSIS — K760 Fatty (change of) liver, not elsewhere classified: Secondary | ICD-10-CM | POA: Diagnosis not present

## 2016-08-06 LAB — POCT I-STAT CREATININE: Creatinine, Ser: 0.7 mg/dL (ref 0.44–1.00)

## 2016-08-06 LAB — POCT URINE PREGNANCY: PREG TEST UR: NEGATIVE

## 2016-08-06 MED ORDER — IOPAMIDOL (ISOVUE-300) INJECTION 61%
100.0000 mL | Freq: Once | INTRAVENOUS | Status: AC | PRN
  Administered 2016-08-06: 100 mL via INTRAVENOUS

## 2016-08-06 NOTE — Patient Instructions (Signed)
CT abdomen  Stool culture  Keep phone with you

## 2016-08-06 NOTE — Progress Notes (Signed)
Pre visit review using our clinic review tool, if applicable. No additional management support is needed unless otherwise documented below in the visit note. 

## 2016-08-06 NOTE — Progress Notes (Signed)
Subjective:    Patient ID: Caitlin Gomez, female    DOB: Feb 13, 1981, 36 y.o.   MRN: [id number]  CC: Caitlin Gomez is a 35 y.o. female who presents today for an acute visit.    HPI: Chief complaint of abdominal pain in the lower abdomen 1 week, unchanged. Describes as a 'brick', stomach ache. seems to be triggered with certain foods. No fever, nausea, vomiting, dysuria, low back pain. Brown diarrhea x 1 after taco salad today. 3-4 diarrhea per day every day for one week. 'something I'm eating.'   Also noted she has hives intermittant over the past week ( has these off and on for 2 years). No trouble swallowing, SOB.  Follows with allergist and appointment 3/6. No acid reflux.   Decided to stay on lisinopril.   No recent antibiotics. Only new med is vitamin b12. No one else in household has diarrhea.   No concern for pregnancy. H/o diverticulitis.        HISTORY:  Past Medical History:  Diagnosis Date  . Chicken pox   . Depression   . Diabetes mellitus without complication ([place])   . Diverticulitis    12/2015  . Eating disorder   . Genital warts   . GERD (gastroesophageal reflux disease)   . Hypertension    Past Surgical History:  Procedure Laterality Date  . TONSILLECTOMY AND ADENOIDECTOMY     Family History  Problem Relation Age of Onset  . Alcohol abuse Mother   . Hyperlipidemia Mother   . Hypertension Mother   . Mental illness Mother   . Diabetes Mother   . Diabetes Father   . Mental illness Paternal Aunt   . Cancer Paternal Aunt   . Early death Maternal Grandmother   . Mental illness Maternal Grandmother   . Diabetes Maternal Grandmother   . Diabetes Maternal Grandfather   . Stroke Paternal Grandfather   . Diabetes Paternal Grandfather     Allergies: Amoxicillin; Ciprofloxacin; Clindamycin; Flagyl [metronidazole]; Irene Limbo; Oysters [shellfish allergy]; Ziprasidone hcl; and Amoxicillin-pot clavulanate Current Outpatient Prescriptions on File Prior to Visit    Medication Sig Dispense Refill  . Blood Glucose Monitoring Suppl (FIFTY50 GLUCOSE METER 2.0) w/Device KIT Use as directed.    Marland Kitchen glipiZIDE (GLUCOTROL) 10 MG tablet TAKE 1 TABLET BY MOUTH 2 TIMES A DAY BEFORE MEALS 180 tablet 2  . glucose blood (BAYER CONTOUR NEXT TEST) test strip Use 2 (two) times daily.    Marland Kitchen lamoTRIgine (LAMICTAL) 100 MG tablet Take 125 mg by mouth 2 (two) times daily.     . metFORMIN (GLUCOPHAGE) 500 MG tablet Take 1 tablet (500 mg total) by mouth 2 (two) times daily with a meal. 90 tablet 2   No current facility-administered medications on file prior to visit.     Social History  Substance Use Topics  . Smoking status: Former Smoker    Types: Cigarettes  . Smokeless tobacco: Never Used  . Alcohol use Yes     Comment: one glass of wine once every 3 months    Review of Systems  Constitutional: Negative for chills and fever.  HENT: Negative for trouble swallowing.   Respiratory: Negative for cough and shortness of breath.   Cardiovascular: Negative for chest pain and palpitations.  Gastrointestinal: Positive for abdominal pain and diarrhea. Negative for nausea and vomiting.  Skin: Positive for rash.      Objective:    BP 138/82 (BP Location: Left Arm, Patient Position: Sitting, Cuff Size: Large)  Pulse 95   Temp 98.3 F (36.8 C) (Oral)   Wt 262 lb 12.8 oz (119.2 kg)   SpO2 98%   BMI 42.42 kg/m    Physical Exam  Constitutional: She appears well-developed and well-nourished.  Eyes: Conjunctivae are normal.  Cardiovascular: Normal rate, regular rhythm, normal heart sounds and normal pulses.   Pulmonary/Chest: Effort normal and breath sounds normal. She has no wheezes. She has no rhonchi. She has no rales.  Abdominal: Soft. Normal appearance and bowel sounds are normal. She exhibits no distension, no fluid wave, no ascites and no mass. There is tenderness in the right lower quadrant and left lower quadrant. There is no rigidity, no rebound, no guarding, no  CVA tenderness, no tenderness at McBurney's point and negative Murphy's sign.  Neurological: She is alert.  Skin: Skin is warm and dry.  Psychiatric: She has a normal mood and affect. Her speech is normal and behavior is normal. Thought content normal.  Vitals reviewed.      Assessment & Plan:   1. Lower abdominal pain Working diagnosis of viral gastritis versus diverticulitis. Patient has a history of diverticulitis. Afebrile. Pending CT abdomen and pelvis. Pending stool culture.  - CT ABDOMEN PELVIS W CONTRAST - C. difficile GDH and Toxin A/B; Future    I have discontinued Ms. Laplante's cetirizine, azithromycin, and losartan. I am also having her maintain her glucose blood, FIFTY50 GLUCOSE METER 2.0, glipiZIDE, lamoTRIgine, metFORMIN, and lisinopril.   Meds ordered this encounter  Medications  . lisinopril (PRINIVIL,ZESTRIL) 10 MG tablet    Sig: Take 10 mg by mouth daily.    Return precautions given.   Risks, benefits, and alternatives of the medications and treatment plan prescribed today were discussed, and patient expressed understanding.   Education regarding symptom management and diagnosis given to patient on AVS.  Continue to follow with Mable Paris, FNP for routine health maintenance.   Caitlin Gomez and I agreed with plan.   Mable Paris, FNP

## 2016-08-07 ENCOUNTER — Telehealth: Payer: Self-pay

## 2016-08-07 ENCOUNTER — Other Ambulatory Visit: Payer: BLUE CROSS/BLUE SHIELD

## 2016-08-07 ENCOUNTER — Other Ambulatory Visit: Payer: Self-pay | Admitting: Family

## 2016-08-07 DIAGNOSIS — R1030 Lower abdominal pain, unspecified: Secondary | ICD-10-CM

## 2016-08-07 DIAGNOSIS — I10 Essential (primary) hypertension: Secondary | ICD-10-CM

## 2016-08-07 NOTE — Telephone Encounter (Signed)
LMTCB

## 2016-08-07 NOTE — Telephone Encounter (Signed)
-----   Message from Burnard Hawthorne, [place] sent at 08/07/2016  7:25 AM EST ----- CALL-  Normal CT abdomen- please ensure patient has seen mychart message  Let me know if cannot reach patient.

## 2016-08-08 LAB — C. DIFFICILE GDH AND TOXIN A/B
C. difficile GDH: DETECTED — AB
C. difficile Toxin A/B: NOT DETECTED

## 2016-08-08 LAB — CLOSTRIDIUM DIFFICILE BY PCR: CDIFFPCR: NOT DETECTED

## 2016-08-17 ENCOUNTER — Encounter: Payer: Self-pay | Admitting: Family

## 2016-08-20 ENCOUNTER — Ambulatory Visit (INDEPENDENT_AMBULATORY_CARE_PROVIDER_SITE_OTHER): Payer: BLUE CROSS/BLUE SHIELD | Admitting: Family

## 2016-08-20 ENCOUNTER — Encounter: Payer: Self-pay | Admitting: Family

## 2016-08-20 VITALS — BP 154/88 | HR 105 | Temp 98.5°F | Ht 66.0 in | Wt 266.0 lb

## 2016-08-20 DIAGNOSIS — R059 Cough, unspecified: Secondary | ICD-10-CM | POA: Insufficient documentation

## 2016-08-20 DIAGNOSIS — R05 Cough: Secondary | ICD-10-CM

## 2016-08-20 DIAGNOSIS — I10 Essential (primary) hypertension: Secondary | ICD-10-CM | POA: Diagnosis not present

## 2016-08-20 LAB — POC INFLUENZA A&B (BINAX/QUICKVUE)
Influenza A, POC: NEGATIVE
Influenza B, POC: NEGATIVE

## 2016-08-20 MED ORDER — AZITHROMYCIN 250 MG PO TABS: ORAL_TABLET | ORAL | 0 refills | Status: DC

## 2016-08-20 MED ORDER — BENZONATATE 100 MG PO CAPS
100.0000 mg | ORAL_CAPSULE | Freq: Three times a day (TID) | ORAL | 1 refills | Status: DC | PRN
Start: 2016-08-20 — End: 2016-11-14

## 2016-08-20 NOTE — Assessment & Plan Note (Addendum)
Duration less than a week. Patient is afebrile. Patient jointly agreed likely viral etiology. Patient will try Mucinex for sinus and ear pressure. If no improvement, she will fill azithromycin.

## 2016-08-20 NOTE — Assessment & Plan Note (Signed)
Elevated today. Patient taking over-the-counter decongestants which suspect has contributed to this. Advised her to stop these agents. She was also switched from lisinopril to losartan. She will let me know dose. Pending BMP.

## 2016-08-20 NOTE — Patient Instructions (Addendum)
Labs   Start PLAIN mucinex  Stop alselzter as concerned medication increasing heart rate and bloodd pressure. Monitor BP and ensure it comes down to < 140/90 on losartan  Let me know losartan dose  I suspect that your infection is viral in nature.  As discussed, I advise that you wait to fill the antibiotic after 1-2 days of symptom management to see if your symptoms improve. If you do not show improvement, you may take the antibiotic as prescribed.   Increase intake of clear fluids. Congestion is best treated by hydration, when mucus is wetter, it is thinner, less sticky, and easier to expel from the body, either through coughing up drainage, or by blowing your nose.   Get plenty of rest.   Use saline nasal drops and blow your nose frequently. Run a humidifier at night and elevate the head of the bed. Vicks Vapor rub will help with congestion and cough. Steam showers and sinus massage for congestion.   Use Acetaminophen or Ibuprofen as needed for fever or pain. Avoid second hand smoke. Even the smallest exposure will worsen symptoms.    You can also try a teaspoon of honey to see if this will help reduce cough. Throat lozenges can sometimes be beneficial as well.    This illness will typically last 7 - 10 days.   Please follow up with our clinic if you develop a fever greater than 101 F, symptoms worsen, or do not resolve in the next week.

## 2016-08-20 NOTE — Progress Notes (Signed)
Pre visit review using our clinic review tool, if applicable. No additional management support is needed unless otherwise documented below in the visit note. 

## 2016-08-20 NOTE — Progress Notes (Addendum)
 Subjective:    Patient ID: Caitlin Gomez, female    DOB: 01/22/1981, 36 y.o.   MRN: [id number]  CC: Caitlin Gomez is a 36 y.o. female who presents today for an acute visit.    HPI: Cough, congestion x 6 days, worsening.  Pain in teeth, ear pain. Had tactile fever and chills x 2 days ago, resolved.  Tried alselzter cough and cough which think 'has elevated blood  Pressure' and no relief.  Denies exertional chest pain or pressure, numbness or tingling radiating to left arm or jaw, palpitations, dizziness, frequent headaches, changes in vision, or shortness of breath.      HISTORY:  Past Medical History:  Diagnosis Date  . Chicken pox   . Depression   . Diabetes mellitus without complication ([facility])   . Diverticulitis    12/2015  . Eating disorder   . Genital warts   . GERD (gastroesophageal reflux disease)   . Hypertension    Past Surgical History:  Procedure Laterality Date  . TONSILLECTOMY AND ADENOIDECTOMY     Family History  Problem Relation Age of Onset  . Alcohol abuse Mother   . Hyperlipidemia Mother   . Hypertension Mother   . Mental illness Mother   . Diabetes Mother   . Diabetes Father   . Mental illness Paternal Aunt   . Cancer Paternal Aunt   . Early death Maternal Grandmother   . Mental illness Maternal Grandmother   . Diabetes Maternal Grandmother   . Diabetes Maternal Grandfather   . Stroke Paternal Grandfather   . Diabetes Paternal Grandfather     Allergies: Amoxicillin; Ciprofloxacin; Clindamycin; Flagyl [metronidazole]; Kale; Oysters [shellfish allergy]; Ziprasidone hcl; and Amoxicillin-pot clavulanate Current Outpatient Prescriptions on File Prior to Visit  Medication Sig Dispense Refill  . Blood Glucose Monitoring Suppl (FIFTY50 GLUCOSE METER 2.0) w/Device KIT Use as directed.    . glipiZIDE (GLUCOTROL) 10 MG tablet TAKE 1 TABLET BY MOUTH 2 TIMES A DAY BEFORE MEALS 180 tablet 2  . glucose blood (BAYER CONTOUR NEXT TEST) test strip Use 2 (two)  times daily.    . lamoTRIgine (LAMICTAL) 100 MG tablet Take 125 mg by mouth 2 (two) times daily.     . metFORMIN (GLUCOPHAGE) 500 MG tablet Take 1 tablet (500 mg total) by mouth 2 (two) times daily with a meal. 90 tablet 2   No current facility-administered medications on file prior to visit.     Social History  Substance Use Topics  . Smoking status: Former Smoker    Types: Cigarettes  . Smokeless tobacco: Never Used  . Alcohol use Yes     Comment: one glass of wine once every 3 months    Review of Systems  Constitutional: Negative for chills and fever.  HENT: Positive for congestion, ear pain and sinus pressure. Negative for sore throat.   Respiratory: Positive for cough. Negative for shortness of breath and wheezing.   Cardiovascular: Negative for chest pain and palpitations.  Gastrointestinal: Negative for nausea and vomiting.      Objective:    BP (!) 154/88   Pulse (!) 105   Temp 98.5 F (36.9 C) (Oral)   Ht 5' 6" (1.676 m)   Wt 266 lb (120.7 kg)   SpO2 98%   BMI 42.93 kg/m    Physical Exam  Constitutional: She appears well-developed and well-nourished.  HENT:  Head: Normocephalic and atraumatic.  Right Ear: Hearing, external ear and ear canal normal. No drainage, swelling or tenderness.   No foreign bodies. Tympanic membrane is bulging. Tympanic membrane is not erythematous. No middle ear effusion. No decreased hearing is noted.  Left Ear: Hearing, external ear and ear canal normal. No drainage, swelling or tenderness. No foreign bodies. Tympanic membrane is bulging. Tympanic membrane is not erythematous.  No middle ear effusion. No decreased hearing is noted.  Nose: Nose normal. No rhinorrhea. Right sinus exhibits no maxillary sinus tenderness and no frontal sinus tenderness. Left sinus exhibits no maxillary sinus tenderness and no frontal sinus tenderness.  Mouth/Throat: Uvula is midline, oropharynx is clear and moist and mucous membranes are normal. No  oropharyngeal exudate, posterior oropharyngeal edema, posterior oropharyngeal erythema or tonsillar abscesses.  Eyes: Conjunctivae are normal.  Cardiovascular: Regular rhythm, normal heart sounds and normal pulses.   Pulmonary/Chest: Effort normal and breath sounds normal. She has no wheezes. She has no rhonchi. She has no rales.  Lymphadenopathy:       Head (right side): No submental, no submandibular, no tonsillar, no preauricular, no posterior auricular and no occipital adenopathy present.       Head (left side): No submental, no submandibular, no tonsillar, no preauricular, no posterior auricular and no occipital adenopathy present.    She has no cervical adenopathy.  Neurological: She is alert.  Skin: Skin is warm and dry.  Psychiatric: She has a normal mood and affect. Her speech is normal and behavior is normal. Thought content normal.  Vitals reviewed.      Assessment & Plan:   Problem List Items Addressed This Visit      Cardiovascular and Mediastinum   Essential hypertension    Elevated today. Patient taking over-the-counter decongestants which suspect has contributed to this. Advised her to stop these agents. She was also switched from lisinopril to losartan. She will let me know dose. Pending BMP.      Relevant Medications   losartan (COZAAR) 25 MG tablet   Other Relevant Orders   Basic metabolic panel     Other   Cough - Primary    Duration less than a week. Patient is afebrile. Patient jointly agreed likely viral etiology. Patient will try Mucinex for sinus and ear pressure. If no improvement, she will fill azithromycin.      Relevant Medications   benzonatate (TESSALON PERLES) 100 MG capsule   azithromycin (ZITHROMAX) 250 MG tablet   Other Relevant Orders   POC Influenza A&B (Binax test)         I have discontinued Ms. Surowiec's lisinopril. I am also having her start on benzonatate and azithromycin. Additionally, I am having her maintain her glucose blood,  FIFTY50 GLUCOSE METER 2.0, glipiZIDE, lamoTRIgine, metFORMIN, and losartan.   Meds ordered this encounter  Medications  . losartan (COZAAR) 25 MG tablet    Sig: Take 25 mg by mouth daily.  . benzonatate (TESSALON PERLES) 100 MG capsule    Sig: Take 1 capsule (100 mg total) by mouth 3 (three) times daily as needed for cough.    Dispense:  30 capsule    Refill:  1    Order Specific Question:   Supervising Provider    Answer:   Deborra Medina L [[id number]]  . azithromycin (ZITHROMAX) 250 MG tablet    Sig: Tale 500 mg PO on day 1, then 250 mg PO q24h x 4 days.    Dispense:  6 tablet    Refill:  0    Order Specific Question:   Supervising Provider    Answer:   Crecencio Mc [  [id number]]    Return precautions given.   Risks, benefits, and alternatives of the medications and treatment plan prescribed today were discussed, and patient expressed understanding.   Education regarding symptom management and diagnosis given to patient on AVS.  Continue to follow with Mable Paris, FNP for routine health maintenance.   [name] and I agreed with plan.   Mable Paris, FNP

## 2016-08-21 ENCOUNTER — Encounter: Payer: Self-pay | Admitting: Family

## 2016-08-21 ENCOUNTER — Other Ambulatory Visit: Payer: Self-pay | Admitting: Family

## 2016-08-21 DIAGNOSIS — I10 Essential (primary) hypertension: Secondary | ICD-10-CM

## 2016-08-21 LAB — BASIC METABOLIC PANEL
BUN: 6 mg/dL (ref 6–23)
CHLORIDE: 105 meq/L (ref 96–112)
CO2: 24 meq/L (ref 19–32)
CREATININE: 0.68 mg/dL (ref 0.40–1.20)
Calcium: 9.8 mg/dL (ref 8.4–10.5)
GFR: 105.27 mL/min (ref >60.00)
Glucose, Bld: 187 mg/dL — ABNORMAL HIGH (ref 70–99)
Potassium: 4 mEq/L (ref 3.5–5.1)
Sodium: 137 mEq/L (ref 135–145)

## 2016-09-05 ENCOUNTER — Encounter: Payer: Self-pay | Admitting: Family

## 2016-09-05 ENCOUNTER — Ambulatory Visit (INDEPENDENT_AMBULATORY_CARE_PROVIDER_SITE_OTHER): Payer: BLUE CROSS/BLUE SHIELD | Admitting: Family

## 2016-09-05 VITALS — BP 140/95 | HR 90 | Temp 98.2°F | Ht 66.0 in | Wt 265.8 lb

## 2016-09-05 DIAGNOSIS — J029 Acute pharyngitis, unspecified: Secondary | ICD-10-CM | POA: Insufficient documentation

## 2016-09-05 DIAGNOSIS — J302 Other seasonal allergic rhinitis: Secondary | ICD-10-CM | POA: Diagnosis not present

## 2016-09-05 DIAGNOSIS — H66001 Acute suppurative otitis media without spontaneous rupture of ear drum, right ear: Secondary | ICD-10-CM | POA: Insufficient documentation

## 2016-09-05 DIAGNOSIS — I10 Essential (primary) hypertension: Secondary | ICD-10-CM | POA: Diagnosis not present

## 2016-09-05 DIAGNOSIS — J309 Allergic rhinitis, unspecified: Secondary | ICD-10-CM | POA: Insufficient documentation

## 2016-09-05 LAB — POCT RAPID STREP A (OFFICE): RAPID STREP A SCREEN: NEGATIVE

## 2016-09-05 MED ORDER — ALBUTEROL SULFATE HFA 108 (90 BASE) MCG/ACT IN AERS: 2.0000 | INHALATION_SPRAY | Freq: Four times a day (QID) | RESPIRATORY_TRACT | 1 refills | Status: DC | PRN

## 2016-09-05 MED ORDER — LOSARTAN POTASSIUM 50 MG PO TABS: 25.0000 mg | ORAL_TABLET | Freq: Every day | ORAL | 1 refills | Status: DC

## 2016-09-05 MED ORDER — FLUTICASONE PROPIONATE 50 MCG/ACT NA SUSP: 2.0000 | Freq: Every day | NASAL | 2 refills | Status: DC

## 2016-09-05 MED ORDER — CLARITHROMYCIN 250 MG PO TABS: 250.0000 mg | ORAL_TABLET | Freq: Two times a day (BID) | ORAL | 0 refills | Status: AC

## 2016-09-05 MED ORDER — MONTELUKAST SODIUM 10 MG PO TABS: 10.0000 mg | ORAL_TABLET | Freq: Every day | ORAL | 0 refills | Status: DC

## 2016-09-05 NOTE — Assessment & Plan Note (Signed)
Strep negative. Advised conservative measures

## 2016-09-05 NOTE — Patient Instructions (Signed)
Take probiotics daily while on and for 2 weeks after antibiotic due to risks of colitis from frequent antibiotic use  After taken antibiotic, you may then start singulair and flonase to see if helps with allergies.   Start 50mg  losartan and monitor BP at home, goal < 140/90.   Follow up in 3 months and let me know if not better

## 2016-09-05 NOTE — Progress Notes (Signed)
Pre visit review using our clinic review tool, if applicable. No additional management support is needed unless otherwise documented below in the visit note. 

## 2016-09-05 NOTE — Assessment & Plan Note (Signed)
Elevated today. Will increase losartan. Advised close vigilance.

## 2016-09-05 NOTE — Assessment & Plan Note (Addendum)
Due to allergy profile, biaxin is appropriate alternative. 10 days duration. Return precautions given.

## 2016-09-05 NOTE — Progress Notes (Signed)
Subjective:    Patient ID: Caitlin Gomez, female    DOB: [birth year], 36 y.o.   MRN: [id number]  CC: Caitlin Gomez is a 36 y.o. female who presents today for an acute visit.    HPI: CC:   Left side of throat hurt for 2 weeks, waxing and waning. No fever, sinus pressure, HA.   Had to use an inhaler as felt wheezing. Endorses post nasal drip,  Left ear pain. Mostly clear runny congestion.   3 weeks ago placed on azithromycin with improvement in congestion and slight improvement.   Taking mucinex, allegra, benadryl without much relief. No improvement azelestine.     HISTORY:  Past Medical History:  Diagnosis Date  . Chicken pox   . Depression   . Diabetes mellitus without complication ([place])   . Diverticulitis    12/2015  . Eating disorder   . Genital warts   . GERD (gastroesophageal reflux disease)   . Hypertension    Past Surgical History:  Procedure Laterality Date  . TONSILLECTOMY AND ADENOIDECTOMY     Family History  Problem Relation Age of Onset  . Alcohol abuse Mother   . Hyperlipidemia Mother   . Hypertension Mother   . Mental illness Mother   . Diabetes Mother   . Diabetes Father   . Mental illness Paternal Aunt   . Cancer Paternal Aunt   . Early death Maternal Grandmother   . Mental illness Maternal Grandmother   . Diabetes Maternal Grandmother   . Diabetes Maternal Grandfather   . Stroke Paternal Grandfather   . Diabetes Paternal Grandfather     Allergies: Amoxicillin; Ciprofloxacin; Clindamycin; Flagyl [metronidazole]; Kale; Lisinopril; Oysters [shellfish allergy]; Ziprasidone hcl; and Amoxicillin-pot clavulanate Current Outpatient Prescriptions on File Prior to Visit  Medication Sig Dispense Refill  . azithromycin (ZITHROMAX) 250 MG tablet Tale 500 mg PO on day 1, then 250 mg PO q24h x 4 days. 6 tablet 0  . benzonatate (TESSALON PERLES) 100 MG capsule Take 1 capsule (100 mg total) by mouth 3 (three) times daily as needed for cough. 30 capsule 1  .  Blood Glucose Monitoring Suppl (FIFTY50 GLUCOSE METER 2.0) w/Device KIT Use as directed.    Marland Kitchen glipiZIDE (GLUCOTROL) 10 MG tablet TAKE 1 TABLET BY MOUTH 2 TIMES A DAY BEFORE MEALS 180 tablet 2  . glucose blood (BAYER CONTOUR NEXT TEST) test strip Use 2 (two) times daily.    Marland Kitchen lamoTRIgine (LAMICTAL) 100 MG tablet Take 125 mg by mouth 2 (two) times daily.     . metFORMIN (GLUCOPHAGE) 500 MG tablet Take 1 tablet (500 mg total) by mouth 2 (two) times daily with a meal. 90 tablet 2   No current facility-administered medications on file prior to visit.     Social History  Substance Use Topics  . Smoking status: Former Smoker    Types: Cigarettes  . Smokeless tobacco: Never Used  . Alcohol use Yes     Comment: one glass of wine once every 3 months    Review of Systems  Constitutional: Negative for chills and fever.  HENT: Positive for congestion, ear pain, rhinorrhea and sore throat. Negative for sinus pain.   Eyes: Negative for visual disturbance.  Respiratory: Positive for cough and wheezing. Negative for shortness of breath.   Cardiovascular: Negative for chest pain and palpitations.  Gastrointestinal: Negative for nausea and vomiting.  Neurological: Negative for headaches.      Objective:    BP (!) 140/95   Pulse  90   Temp 98.2 F (36.8 C) (Oral)   Ht _0  (1.676 m)   Wt 265 lb 12.8 oz (120.6 kg)   SpO2 98%   BMI 42.90 kg/m    Physical Exam  Constitutional: She appears well-developed and well-nourished.  HENT:  Head: Normocephalic and atraumatic.  Right Ear: Hearing, external ear and ear canal normal. No drainage, swelling or tenderness. No foreign bodies. Tympanic membrane is erythematous and bulging. A middle ear effusion is present. No decreased hearing is noted.  Left Ear: Hearing, tympanic membrane, external ear and ear canal normal. No drainage, swelling or tenderness. No foreign bodies. Tympanic membrane is not erythematous and not bulging.  No middle ear effusion.  No decreased hearing is noted.  Nose: Nose normal. No rhinorrhea. Right sinus exhibits no maxillary sinus tenderness and no frontal sinus tenderness. Left sinus exhibits no maxillary sinus tenderness and no frontal sinus tenderness.  Mouth/Throat: Uvula is midline, oropharynx is clear and moist and mucous membranes are normal. No oropharyngeal exudate, posterior oropharyngeal edema, posterior oropharyngeal erythema or tonsillar abscesses.  Eyes: Conjunctivae are normal.  Cardiovascular: Regular rhythm, normal heart sounds and normal pulses.   Pulmonary/Chest: Effort normal and breath sounds normal. She has no wheezes. She has no rhonchi. She has no rales.  Lymphadenopathy:       Head (right side): No submental, no submandibular, no tonsillar, no preauricular, no posterior auricular and no occipital adenopathy present.       Head (left side): No submental, no submandibular, no tonsillar, no preauricular, no posterior auricular and no occipital adenopathy present.    She has no cervical adenopathy.  Neurological: She is alert.  Skin: Skin is warm and dry.  Psychiatric: She has a normal mood and affect. Her speech is normal and behavior is normal. Thought content normal.  Vitals reviewed.      Assessment & Plan:   Problem List Items Addressed This Visit      Cardiovascular and Mediastinum   Essential hypertension    Elevated today. Will increase losartan. Advised close vigilance.       Relevant Medications   losartan (COZAAR) 50 MG tablet     Respiratory   Allergic rhinitis    Recurrent. Trial singulair and flonase.       Relevant Medications   montelukast (SINGULAIR) 10 MG tablet   albuterol (PROVENTIL HFA) 108 (90 Base) MCG/ACT inhaler   fluticasone (FLONASE) 50 MCG/ACT nasal spray     Nervous and Auditory   Acute suppurative otitis media of right ear without spontaneous rupture of tympanic membrane - Primary    Due to allergy profile, biaxin is appropriate alternative. 10  days duration. Return precautions given.       Relevant Medications   clarithromycin (BIAXIN) 250 MG tablet     Other   Sore throat    Strep negative. Advised conservative measures      Relevant Orders   POCT rapid strep A (Completed)        I have changed Ms. Spackman's losartan. I am also having her start on clarithromycin, montelukast, albuterol, and fluticasone. Additionally, I am having her maintain her glucose blood, FIFTY50 GLUCOSE METER 2.0, glipiZIDE, lamoTRIgine, metFORMIN, benzonatate, and azithromycin.   Meds ordered this encounter  Medications  . losartan (COZAAR) 50 MG tablet    Sig: Take 0.5 tablets (25 mg total) by mouth daily.    Dispense:  90 tablet    Refill:  1    Order Specific Question:  Supervising Provider    Answer:   Crecencio Mc [[id number]]  . clarithromycin (BIAXIN) 250 MG tablet    Sig: Take 1 tablet (250 mg total) by mouth 2 (two) times daily.    Dispense:  20 tablet    Refill:  0    Order Specific Question:   Supervising Provider    Answer:   Deborra Medina L [[id number]]  . montelukast (SINGULAIR) 10 MG tablet    Sig: Take 1 tablet (10 mg total) by mouth at bedtime.    Dispense:  90 tablet    Refill:  0    Order Specific Question:   Supervising Provider    Answer:   Deborra Medina L [[id number]]  . albuterol (PROVENTIL HFA) 108 (90 Base) MCG/ACT inhaler    Sig: Inhale 2 puffs into the lungs every 6 (six) hours as needed for wheezing or shortness of breath.    Dispense:  1 Inhaler    Refill:  1    Order Specific Question:   Supervising Provider    Answer:   Derrel Nip, TERESA L [[id number]]  . fluticasone (FLONASE) 50 MCG/ACT nasal spray    Sig: Place 2 sprays into both nostrils daily.    Dispense:  16 g    Refill:  2    Order Specific Question:   Supervising Provider    Answer:   Crecencio Mc [[id number]]    Return precautions given.   Risks, benefits, and alternatives of the medications and treatment plan prescribed today were discussed, and patient expressed  understanding.   Education regarding symptom management and diagnosis given to patient on AVS.  Continue to follow with Mable Paris, FNP for routine health maintenance.   Caitlin Gomez and I agreed with plan.   Mable Paris, FNP

## 2016-09-05 NOTE — Assessment & Plan Note (Signed)
Recurrent. Trial singulair and flonase.

## 2016-09-10 ENCOUNTER — Encounter: Payer: Self-pay | Admitting: Family

## 2016-09-11 ENCOUNTER — Other Ambulatory Visit: Payer: Self-pay | Admitting: Family

## 2016-09-11 DIAGNOSIS — J029 Acute pharyngitis, unspecified: Secondary | ICD-10-CM

## 2016-09-11 MED ORDER — LIDOCAINE VISCOUS 2 % MT SOLN: 15.0000 mL | OROMUCOSAL | 0 refills | Status: DC | PRN

## 2016-10-03 ENCOUNTER — Ambulatory Visit (INDEPENDENT_AMBULATORY_CARE_PROVIDER_SITE_OTHER): Payer: BLUE CROSS/BLUE SHIELD | Admitting: Family

## 2016-10-03 ENCOUNTER — Encounter: Payer: Self-pay | Admitting: Family

## 2016-10-03 VITALS — BP 156/88 | HR 108 | Temp 98.2°F | Ht 66.0 in | Wt 271.6 lb

## 2016-10-03 DIAGNOSIS — S93401A Sprain of unspecified ligament of right ankle, initial encounter: Secondary | ICD-10-CM | POA: Diagnosis not present

## 2016-10-03 NOTE — Patient Instructions (Signed)
Suspect ankle strain as discussed  Rest  Ice Compression Elevation ( for swelling)  Ibuprofen as needed for pain  For first two weeks at least, recommend largely immobilization. You may start gentle passive stretching exercises.  Let me know if pain does not improve with conservative therapy as would want to pursue xrays.

## 2016-10-03 NOTE — Progress Notes (Signed)
Pre visit review using our clinic review tool, if applicable. No additional management support is needed unless otherwise documented below in the visit note. 

## 2016-10-03 NOTE — Progress Notes (Signed)
Subjective:    Patient ID: Caitlin Gomez, female    DOB: Nov 17, 1980, 36 y.o.   MRN: [id number]  CC: Caitlin Gomez is a 36 y.o. female who presents today for an acute visit.    HPI: CC: right ankle pain 5 days, waxing and waning. 'better today' Wore 'bad' flat shoes. Hurts to walk and feels swollen. No bruising. No twisting, falling. Ibuprofen helps some.  NO fever, chills, rash, redness.   No h/o gout.        HISTORY:  Past Medical History:  Diagnosis Date  . Chicken pox   . Depression   . Diabetes mellitus without complication ([place])   . Diverticulitis    12/2015  . Eating disorder   . Genital warts   . GERD (gastroesophageal reflux disease)   . Hypertension    Past Surgical History:  Procedure Laterality Date  . TONSILLECTOMY AND ADENOIDECTOMY     Family History  Problem Relation Age of Onset  . Alcohol abuse Mother   . Hyperlipidemia Mother   . Hypertension Mother   . Mental illness Mother   . Diabetes Mother   . Diabetes Father   . Mental illness Paternal Aunt   . Cancer Paternal Aunt   . Early death Maternal Grandmother   . Mental illness Maternal Grandmother   . Diabetes Maternal Grandmother   . Diabetes Maternal Grandfather   . Stroke Paternal Grandfather   . Diabetes Paternal Grandfather     Allergies: Amoxicillin; Ciprofloxacin; Clindamycin; Flagyl [metronidazole]; Kale; Lisinopril; Oysters [shellfish allergy]; Ziprasidone hcl; and Amoxicillin-pot clavulanate Current Outpatient Prescriptions on File Prior to Visit  Medication Sig Dispense Refill  . albuterol (PROVENTIL HFA) 108 (90 Base) MCG/ACT inhaler Inhale 2 puffs into the lungs every 6 (six) hours as needed for wheezing or shortness of breath. 1 Inhaler 1  . azithromycin (ZITHROMAX) 250 MG tablet Tale 500 mg PO on day 1, then 250 mg PO q24h x 4 days. 6 tablet 0  . benzonatate (TESSALON PERLES) 100 MG capsule Take 1 capsule (100 mg total) by mouth 3 (three) times daily as needed for cough. 30  capsule 1  . Blood Glucose Monitoring Suppl (FIFTY50 GLUCOSE METER 2.0) w/Device KIT Use as directed.    . fluticasone (FLONASE) 50 MCG/ACT nasal spray Place 2 sprays into both nostrils daily. 16 g 2  . glipiZIDE (GLUCOTROL) 10 MG tablet TAKE 1 TABLET BY MOUTH 2 TIMES A DAY BEFORE MEALS 180 tablet 2  . glucose blood (BAYER CONTOUR NEXT TEST) test strip Use 2 (two) times daily.    Marland Kitchen lamoTRIgine (LAMICTAL) 100 MG tablet Take 125 mg by mouth 2 (two) times daily.     Marland Kitchen lidocaine (XYLOCAINE) 2 % solution Use as directed 15 mLs in the mouth or throat every 3 (three) hours as needed for mouth pain (gargle; may spit or swallow). 100 mL 0  . losartan (COZAAR) 50 MG tablet Take 0.5 tablets (25 mg total) by mouth daily. 90 tablet 1  . metFORMIN (GLUCOPHAGE) 500 MG tablet Take 1 tablet (500 mg total) by mouth 2 (two) times daily with a meal. 90 tablet 2  . montelukast (SINGULAIR) 10 MG tablet Take 1 tablet (10 mg total) by mouth at bedtime. 90 tablet 0   No current facility-administered medications on file prior to visit.     Social History  Substance Use Topics  . Smoking status: Former Smoker    Types: Cigarettes  . Smokeless tobacco: Never Used  . Alcohol use  Yes     Comment: one glass of wine once every 3 months    Review of Systems  Constitutional: Negative for chills and fever.  Respiratory: Negative for cough.   Cardiovascular: Negative for chest pain and palpitations.  Gastrointestinal: Negative for nausea and vomiting.  Musculoskeletal: Positive for joint swelling.      Objective:    BP (!) 156/88   Pulse (!) 108   Temp 98.2 F (36.8 C) (Oral)   Ht 5' 6" (1.676 m)   Wt 271 lb 9.6 oz (123.2 kg)   SpO2 99%   BMI 43.84 kg/m    Physical Exam  Constitutional: She appears well-developed and well-nourished.  Eyes: Conjunctivae are normal.  Cardiovascular: Normal rate, regular rhythm, normal heart sounds and normal pulses.   Pulmonary/Chest: Effort normal and breath sounds  normal. She has no wheezes. She has no rhonchi. She has no rales.  Musculoskeletal:       Right ankle: She exhibits decreased range of motion. She exhibits no swelling, no ecchymosis and no deformity. Tenderness.       Feet:  No pain with Squeeze test at mid calf. No pain over lateral malleolus, medial malleolus, base of the fifth metatarsal.   No pain, step off over navicular bone. Pain over lateral edge of foot as marked on diagram  Pain elicited with external rotation stress test. Pain elicited with plantar and dorsi flexion.  Sensation intact equally bilateral lower extremities. Palpable pedal pulses.   No erythema, increased warmth, swelling.    Neurological: She is alert.  Skin: Skin is warm and dry.  Psychiatric: She has a normal mood and affect. Her speech is normal and behavior is normal. Thought content normal.  Vitals reviewed.      Assessment & Plan:   1. Sprain of right ankle, unspecified ligament, initial encounter Young woman with no twisting injury, fall. Patient jointly agreed most likely an ankle sprain as no predisposing injuries to suggest fracture. She is able to ambulate with minimal pain suggesting perhaps a grade 1 sprain. Patient does have pain over the lateral side of right foot and pain with ROM excercises. No swelling, ecchymosis. We jointly agreed conservative therapy including rest, compression, ice;  if not better in the next couple days, patient will let me know and we'll pursue imaging.    I am having Ms. Winner maintain her glucose blood, FIFTY50 GLUCOSE METER 2.0, glipiZIDE, lamoTRIgine, metFORMIN, benzonatate, azithromycin, losartan, montelukast, albuterol, fluticasone, lidocaine, and desloratadine.   Meds ordered this encounter  Medications  . desloratadine (CLARINEX) 5 MG tablet    Sig: Take 5 mg by mouth 4 (four) times daily.    Return precautions given.   Risks, benefits, and alternatives of the medications and treatment plan prescribed  today were discussed, and patient expressed understanding.   Education regarding symptom management and diagnosis given to patient on AVS.  Continue to follow with Mable Paris, FNP for routine health maintenance.   Caitlin Gomez and I agreed with plan.   Mable Paris, FNP

## 2016-10-04 ENCOUNTER — Encounter: Payer: Self-pay | Admitting: Family

## 2016-10-04 NOTE — Progress Notes (Signed)
Patient stated she does not own a BP machine at home.  Patient has not been able to check BP

## 2016-10-15 ENCOUNTER — Ambulatory Visit: Payer: BLUE CROSS/BLUE SHIELD | Admitting: Family

## 2016-10-18 ENCOUNTER — Encounter: Payer: Self-pay | Admitting: Family

## 2016-10-23 ENCOUNTER — Encounter: Payer: Self-pay | Admitting: Family

## 2016-10-23 ENCOUNTER — Ambulatory Visit (INDEPENDENT_AMBULATORY_CARE_PROVIDER_SITE_OTHER): Payer: BLUE CROSS/BLUE SHIELD | Admitting: Family

## 2016-10-23 VITALS — BP 160/98 | HR 94 | Temp 98.6°F | Resp 16 | Ht 66.0 in | Wt 268.2 lb

## 2016-10-23 DIAGNOSIS — N912 Amenorrhea, unspecified: Secondary | ICD-10-CM

## 2016-10-23 DIAGNOSIS — I10 Essential (primary) hypertension: Secondary | ICD-10-CM

## 2016-10-23 LAB — BASIC METABOLIC PANEL
BUN: 9 mg/dL (ref 7–25)
CALCIUM: 9.2 mg/dL (ref 8.6–10.2)
CO2: 19 mmol/L — AB (ref 20–31)
Chloride: 103 mmol/L (ref 98–110)
Creat: 0.75 mg/dL (ref 0.50–1.10)
GLUCOSE: 96 mg/dL (ref 65–99)
POTASSIUM: 3.8 mmol/L (ref 3.5–5.3)
SODIUM: 137 mmol/L (ref 135–146)

## 2016-10-23 LAB — POCT URINE PREGNANCY: PREG TEST UR: NEGATIVE

## 2016-10-23 MED ORDER — LISINOPRIL 10 MG PO TABS: 10.0000 mg | ORAL_TABLET | Freq: Every day | ORAL | 1 refills | Status: DC

## 2016-10-23 NOTE — Progress Notes (Signed)
Subjective:    Patient ID: Caitlin Gomez, female    DOB: 1981/03/13, 36 y.o.   MRN: [id number]  CC: Caitlin Gomez is a 36 y.o. female who presents today for an acute visit.    HPI:   HTN- 'think it's been high.' Taken losartan today. ENT thought skin changes from lisinopril to losartan. Has been on toprol and norsvasc worked well. Denies exertional chest pain or pressure, numbness or tingling radiating to left arm or jaw, palpitations, dizziness, frequent headaches, changes in vision, or shortness of breath.   Leg swelling- resolved with limiting salt   No period for 9 days. Home pregnancy test negative. 'not trying to get pregnant.' Has normal periods and thinks that losartan has affected periods. Would like to go back to lisinopril. Some breast tenderness and felt 'premenstrual.' . Has been on IUD in the past and doesn't want 'anything in body', including Implanon. Cannot take OCP due to lamictal.     HISTORY:  Past Medical History:  Diagnosis Date  . Chicken pox   . Depression   . Diabetes mellitus without complication ([place])   . Diverticulitis    12/2015  . Eating disorder   . Genital warts   . GERD (gastroesophageal reflux disease)   . Hypertension    Past Surgical History:  Procedure Laterality Date  . TONSILLECTOMY AND ADENOIDECTOMY     Family History  Problem Relation Age of Onset  . Alcohol abuse Mother   . Hyperlipidemia Mother   . Hypertension Mother   . Mental illness Mother   . Diabetes Mother   . Diabetes Father   . Mental illness Paternal Aunt   . Cancer Paternal Aunt   . Early death Maternal Grandmother   . Mental illness Maternal Grandmother   . Diabetes Maternal Grandmother   . Diabetes Maternal Grandfather   . Stroke Paternal Grandfather   . Diabetes Paternal Grandfather     Allergies: Amoxicillin; Ciprofloxacin; Clindamycin; Flagyl [metronidazole]; Kale; Lisinopril; Oysters [shellfish allergy]; Ziprasidone hcl; and Amoxicillin-pot  clavulanate Current Outpatient Prescriptions on File Prior to Visit  Medication Sig Dispense Refill  . albuterol (PROVENTIL HFA) 108 (90 Base) MCG/ACT inhaler Inhale 2 puffs into the lungs every 6 (six) hours as needed for wheezing or shortness of breath. 1 Inhaler 1  . desloratadine (CLARINEX) 5 MG tablet Take 5 mg by mouth 4 (four) times daily.    Marland Kitchen glipiZIDE (GLUCOTROL) 10 MG tablet TAKE 1 TABLET BY MOUTH 2 TIMES A DAY BEFORE MEALS 180 tablet 2  . lamoTRIgine (LAMICTAL) 100 MG tablet Take 125 mg by mouth 2 (two) times daily.     . metFORMIN (GLUCOPHAGE) 500 MG tablet Take 1 tablet (500 mg total) by mouth 2 (two) times daily with a meal. 90 tablet 2  . montelukast (SINGULAIR) 10 MG tablet Take 1 tablet (10 mg total) by mouth at bedtime. 90 tablet 0  . azithromycin (ZITHROMAX) 250 MG tablet Tale 500 mg PO on day 1, then 250 mg PO q24h x 4 days. (Patient not taking: Reported on 10/23/2016) 6 tablet 0  . benzonatate (TESSALON PERLES) 100 MG capsule Take 1 capsule (100 mg total) by mouth 3 (three) times daily as needed for cough. (Patient not taking: Reported on 10/23/2016) 30 capsule 1  . Blood Glucose Monitoring Suppl (FIFTY50 GLUCOSE METER 2.0) w/Device KIT Use as directed.    . fluticasone (FLONASE) 50 MCG/ACT nasal spray Place 2 sprays into both nostrils daily. (Patient not taking: Reported on 10/23/2016) 16  g 2  . glucose blood (BAYER CONTOUR NEXT TEST) test strip Use 2 (two) times daily.    Marland Kitchen lidocaine (XYLOCAINE) 2 % solution Use as directed 15 mLs in the mouth or throat every 3 (three) hours as needed for mouth pain (gargle; may spit or swallow). (Patient not taking: Reported on 10/23/2016) 100 mL 0   No current facility-administered medications on file prior to visit.     Social History  Substance Use Topics  . Smoking status: Former Smoker    Types: Cigarettes  . Smokeless tobacco: Never Used  . Alcohol use Yes     Comment: one glass of wine once every 3 months    Review of Systems    Constitutional: Negative for chills and fever.  Eyes: Negative for visual disturbance.  Respiratory: Negative for cough.   Cardiovascular: Positive for leg swelling. Negative for chest pain and palpitations.  Gastrointestinal: Negative for nausea and vomiting.  Genitourinary: Positive for menstrual problem. Negative for pelvic pain, vaginal bleeding and vaginal discharge.  Neurological: Negative for headaches.      Objective:    BP (!) 160/98   Pulse 94   Temp 98.6 F (37 C) (Oral)   Resp 16   Ht _0  (1.676 m)   Wt 268 lb 4 oz (121.7 kg)   LMP 09/16/2016 (Approximate)   SpO2 98%   BMI 43.30 kg/m  BP Readings from Last 3 Encounters:  10/23/16 (!) 160/98  10/03/16 (!) 156/88  09/05/16 (!) 140/95    Physical Exam  Constitutional: She appears well-developed and well-nourished.  Eyes: Conjunctivae are normal.  Cardiovascular: Normal rate, regular rhythm, normal heart sounds and normal pulses.   Pulmonary/Chest: Effort normal and breath sounds normal. She has no wheezes. She has no rhonchi. She has no rales.  Neurological: She is alert.  Skin: Skin is warm and dry.  Psychiatric: She has a normal mood and affect. Her speech is normal and behavior is normal. Thought content normal.  Vitals reviewed.      Assessment & Plan:   Problem List Items Addressed This Visit      Cardiovascular and Mediastinum   Essential hypertension    Elevated. Discuss the teratergenic nature of lisinopril. She would prefer to be on medication. Education provided on angioedema. F/u 2 weeks.       Relevant Medications   lisinopril (PRINIVIL,ZESTRIL) 10 MG tablet   Other Relevant Orders   Basic Metabolic Panel (BMET)     Other   Amenorrhea - Primary    Etiology unclear. Not typical AE to affect menstrual cycle. Negative pregnancy test. Pending hcg serum as well. Discussed the importance of discussing with her psychiatrist and myself if she desires to conceive to the teratogenic nature of her  medications, especially lisinopril. She will use condoms and avoid intercourse during fertile window. She will let me know if she would further consider implanon.       Relevant Orders   POCT urine pregnancy (Completed)   hCG, serum, qualitative         I have discontinued Ms. Charpentier's losartan. I am also having her maintain her glucose blood, FIFTY50 GLUCOSE METER 2.0, glipiZIDE, lamoTRIgine, metFORMIN, benzonatate, azithromycin, montelukast, albuterol, fluticasone, lidocaine, desloratadine, and lisinopril.   Meds ordered this encounter  Medications  . lisinopril (PRINIVIL,ZESTRIL) 10 MG tablet    Sig: Take 1 tablet (10 mg total) by mouth daily.    Dispense:  90 tablet    Refill:  1    Order  Specific Question:   Supervising Provider    Answer:   Crecencio Mc [[id number]]    Return precautions given.   Risks, benefits, and alternatives of the medications and treatment plan prescribed today were discussed, and patient expressed understanding.   Education regarding symptom management and diagnosis given to patient on AVS.  Continue to follow with Burnard Hawthorne, FNP for routine health maintenance.   Caitlin Gomez and I agreed with plan.   Mable Paris, FNP

## 2016-10-23 NOTE — Patient Instructions (Addendum)
Lets await blood test.   We can start lisinopril.   Please remember very harmful to developing baby  Please use condoms and app to track ovulation ( avoid intercourse week of ovulation).   Goal < 135/85.   Follow up in 2 weeks.

## 2016-10-23 NOTE — Assessment & Plan Note (Addendum)
Etiology unclear. Not typical AE to affect menstrual cycle. Negative pregnancy test. Pending hcg serum as well. Discussed the importance of discussing with her psychiatrist and myself if she desires to conceive to the teratogenic nature of her medications, especially lisinopril. She will use condoms and avoid intercourse during fertile window. She will let me know if she would further consider implanon.

## 2016-10-23 NOTE — Assessment & Plan Note (Signed)
Elevated. Discuss the teratergenic nature of lisinopril. She would prefer to be on medication. Education provided on angioedema. F/u 2 weeks.

## 2016-10-24 LAB — HCG, SERUM, QUALITATIVE: Preg, Serum: NEGATIVE

## 2016-11-13 ENCOUNTER — Telehealth: Payer: Self-pay | Admitting: Family

## 2016-11-13 NOTE — Telephone Encounter (Signed)
Call pt  If she cannot see rash, advise otc  cortizone 10.  If worsens, I would want her to be seen.

## 2016-11-13 NOTE — Telephone Encounter (Signed)
Pt called and stated that she got poison ivy on Sunday and that it is on her arms and legs. This morning she noticed that her eye lids were swollen and itchy, but does not see any rash. Cpt was wondering if we can we call in something for her? Please advise, thank you!  Call pt @ [phone]

## 2016-11-13 NOTE — Telephone Encounter (Signed)
Please advise 

## 2016-11-14 ENCOUNTER — Encounter: Payer: Self-pay | Admitting: Family

## 2016-11-14 ENCOUNTER — Ambulatory Visit (INDEPENDENT_AMBULATORY_CARE_PROVIDER_SITE_OTHER): Payer: BLUE CROSS/BLUE SHIELD | Admitting: Family

## 2016-11-14 VITALS — BP 130/88 | HR 76 | Temp 98.3°F | Ht 66.0 in | Wt 260.4 lb

## 2016-11-14 DIAGNOSIS — L237 Allergic contact dermatitis due to plants, except food: Secondary | ICD-10-CM | POA: Diagnosis not present

## 2016-11-14 DIAGNOSIS — F319 Bipolar disorder, unspecified: Secondary | ICD-10-CM | POA: Diagnosis not present

## 2016-11-14 MED ORDER — PREDNISONE 10 MG PO TABS: ORAL_TABLET | ORAL | 0 refills | Status: DC

## 2016-11-14 MED ORDER — TRIAMCINOLONE ACETONIDE 0.025 % EX CREA: 1.0000 "application " | TOPICAL_CREAM | Freq: Two times a day (BID) | CUTANEOUS | 0 refills | Status: DC

## 2016-11-14 NOTE — Telephone Encounter (Signed)
Patient is being seen this afternoon.

## 2016-11-14 NOTE — Patient Instructions (Addendum)
Trial prednisone.   As discussed with Dr Erin Hearing, please be very vigilant with psychotic symptoms.  Please start prednisone tomorrow morning as it will keep you up at night.   Let me know how you are doing

## 2016-11-14 NOTE — Progress Notes (Signed)
Subjective:    Patient ID: Caitlin Gomez, female    DOB: 1980/08/04, 36 y.o.   MRN: [id number]  CC: Caitlin Gomez is a 36 y.o. female who presents today for an acute visit.    HPI: CC: Rash from working in yard from  'poison ivy' which first presented left forearm one week ago, worsening. It since spread to right arm and in between fingers; she also has a small patch below right eye. No vision changes,fever.  Spoke with Psychiatry , Dr Tama High, advised to monitor for mania/sleep while on prednisone.      HISTORY:  Past Medical History:  Diagnosis Date  . Chicken pox   . Depression   . Diabetes mellitus without complication ([place])   . Diverticulitis    12/2015  . Eating disorder   . Genital warts   . GERD (gastroesophageal reflux disease)   . Hypertension    Past Surgical History:  Procedure Laterality Date  . TONSILLECTOMY AND ADENOIDECTOMY     Family History  Problem Relation Age of Onset  . Alcohol abuse Mother   . Hyperlipidemia Mother   . Hypertension Mother   . Mental illness Mother   . Diabetes Mother   . Diabetes Father   . Mental illness Paternal Aunt   . Cancer Paternal Aunt   . Early death Maternal Grandmother   . Mental illness Maternal Grandmother   . Diabetes Maternal Grandmother   . Diabetes Maternal Grandfather   . Stroke Paternal Grandfather   . Diabetes Paternal Grandfather     Allergies: Amoxicillin; Ciprofloxacin; Clindamycin; Flagyl [metronidazole]; Kale; Lisinopril; Oysters [shellfish allergy]; Ziprasidone hcl; and Amoxicillin-pot clavulanate Current Outpatient Prescriptions on File Prior to Visit  Medication Sig Dispense Refill  . fluticasone (FLONASE) 50 MCG/ACT nasal spray Place 2 sprays into both nostrils daily. 16 g 2  . glipiZIDE (GLUCOTROL) 10 MG tablet TAKE 1 TABLET BY MOUTH 2 TIMES A DAY BEFORE MEALS 180 tablet 2  . glucose blood (BAYER CONTOUR NEXT TEST) test strip Use 2 (two) times daily.    Marland Kitchen lamoTRIgine (LAMICTAL) 100 MG  tablet Take 125 mg by mouth 2 (two) times daily.     Marland Kitchen lisinopril (PRINIVIL,ZESTRIL) 10 MG tablet Take 1 tablet (10 mg total) by mouth daily. 90 tablet 1  . metFORMIN (GLUCOPHAGE) 500 MG tablet Take 1 tablet (500 mg total) by mouth 2 (two) times daily with a meal. 90 tablet 2   No current facility-administered medications on file prior to visit.     Social History  Substance Use Topics  . Smoking status: Former Smoker    Types: Cigarettes  . Smokeless tobacco: Never Used  . Alcohol use Yes     Comment: one glass of wine once every 3 months    Review of Systems  Constitutional: Negative for chills and fever.  Respiratory: Negative for cough.   Cardiovascular: Negative for chest pain and palpitations.  Gastrointestinal: Negative for nausea and vomiting.  Skin: Positive for rash.  Psychiatric/Behavioral: Negative for sleep disturbance.      Objective:    BP 130/88   Pulse 76   Temp 98.3 F (36.8 C) (Oral)   Ht 5\' 6"  (1.676 m)   Wt 260 lb 6.4 oz (118.1 kg)   SpO2 99%   BMI 42.03 kg/m    Physical Exam  Constitutional: She appears well-developed and well-nourished.  Eyes: Conjunctivae and lids are normal. Lids are everted and swept, no foreign bodies found. Right eye exhibits no exudate.  Left eye exhibits no exudate. Right conjunctiva is not injected. Left conjunctiva is not injected. Left conjunctiva has no hemorrhage.  No lesions proximal to eye or on eyelid.   Cardiovascular: Normal rate, regular rhythm, normal heart sounds and normal pulses.   Pulmonary/Chest: Effort normal and breath sounds normal. She has no wheezes. She has no rhonchi. She has no rales.  Neurological: She is alert.  Skin: Skin is warm and dry. Rash noted. Rash is vesicular.     Grouped erythematous clear filled vesicular lesions noted bilateral forearms in between metacarpals. Vesicular lesions noted right cheek.   Psychiatric: She has a normal mood and affect. Her speech is normal and behavior is  normal. Thought content normal.  Vitals reviewed.      Assessment & Plan:   1. Bipolar affective disorder, remission status unspecified ([place]) Stable. Had a significant conversation with patient and psychiatrist, Dr. Tama High. We jointly decided the prednisone was reasonable at this time due to the severity of the rash however must be very vigilant for signs psychosis, mania. Particularly as prednisone can affect sleep. Dr. Tama High offered a small dose of Seroquel to help with sleep and patient politely declined. She wanted to see how she felt on the prednisone and offer that she would take Benadryl at bedtime if needed.  2. Poison ivy Afebrile. Patient is well-appearing. Gave patient oral taper prednisone as well as topical prednisone due to severity of rash. - predniSONE (DELTASONE) 10 MG tablet; Take 40 mg by mouth on day 1, then taper 10 mg daily until gone  Dispense: 10 tablet; Refill: 0 - triamcinolone (KENALOG) 0.025 % cream; Apply 1 application topically 2 (two) times daily.  Dispense: 15 g; Refill: 0    I have discontinued Ms. Gutter's benzonatate, azithromycin, montelukast, albuterol, lidocaine, and desloratadine. I am also having her start on predniSONE and triamcinolone. Additionally, I am having her maintain her glucose blood, glipiZIDE, lamoTRIgine, metFORMIN, fluticasone, and lisinopril.   Meds ordered this encounter  Medications  . predniSONE (DELTASONE) 10 MG tablet    Sig: Take 40 mg by mouth on day 1, then taper 10 mg daily until gone    Dispense:  10 tablet    Refill:  0    Order Specific Question:   Supervising Provider    Answer:   Deborra Medina L [[id number]]  . triamcinolone (KENALOG) 0.025 % cream    Sig: Apply 1 application topically 2 (two) times daily.    Dispense:  15 g    Refill:  0    Order Specific Question:   Supervising Provider    Answer:   Crecencio Mc [[id number]]    Return precautions given.   Risks, benefits, and alternatives of the medications and  treatment plan prescribed today were discussed, and patient expressed understanding.   Education regarding symptom management and diagnosis given to patient on AVS.  Continue to follow with Burnard Hawthorne, FNP for routine health maintenance.   Caitlin Gomez and I agreed with plan.   Mable Paris, FNP

## 2016-11-14 NOTE — Progress Notes (Signed)
Pre visit review using our clinic review tool, if applicable. No additional management support is needed unless otherwise documented below in the visit note. 

## 2016-11-14 NOTE — Telephone Encounter (Signed)
Did we call pt?

## 2017-01-10 ENCOUNTER — Other Ambulatory Visit: Payer: Self-pay

## 2017-01-10 MED ORDER — GLUCOSE BLOOD VI STRP: ORAL_STRIP | 2 refills | Status: DC

## 2017-01-16 ENCOUNTER — Other Ambulatory Visit: Payer: Self-pay | Admitting: Family

## 2017-01-16 DIAGNOSIS — J302 Other seasonal allergic rhinitis: Secondary | ICD-10-CM

## 2017-02-03 ENCOUNTER — Encounter: Payer: Self-pay | Admitting: Family

## 2017-02-06 ENCOUNTER — Other Ambulatory Visit: Payer: Self-pay | Admitting: Family

## 2017-02-06 DIAGNOSIS — B379 Candidiasis, unspecified: Secondary | ICD-10-CM

## 2017-02-06 MED ORDER — FLUCONAZOLE 150 MG PO TABS: 150.0000 mg | ORAL_TABLET | Freq: Once | ORAL | 1 refills | Status: AC

## 2017-02-18 ENCOUNTER — Encounter: Payer: Self-pay | Admitting: Family

## 2017-02-18 ENCOUNTER — Ambulatory Visit (INDEPENDENT_AMBULATORY_CARE_PROVIDER_SITE_OTHER): Payer: BLUE CROSS/BLUE SHIELD | Admitting: Family

## 2017-02-18 VITALS — BP 130/75 | HR 94 | Temp 98.5°F | Ht 66.0 in | Wt 265.2 lb

## 2017-02-18 DIAGNOSIS — R911 Solitary pulmonary nodule: Secondary | ICD-10-CM | POA: Diagnosis not present

## 2017-02-18 DIAGNOSIS — K5900 Constipation, unspecified: Secondary | ICD-10-CM

## 2017-02-18 MED ORDER — METRONIDAZOLE 500 MG PO TABS: 500.0000 mg | ORAL_TABLET | Freq: Three times a day (TID) | ORAL | 0 refills | Status: DC

## 2017-02-18 MED ORDER — SULFAMETHOXAZOLE-TRIMETHOPRIM 800-160 MG PO TABS: 1.0000 | ORAL_TABLET | Freq: Two times a day (BID) | ORAL | 0 refills | Status: DC

## 2017-02-18 NOTE — Progress Notes (Signed)
Subjective:    Patient ID: Caitlin Gomez, female    DOB: Jul 22, 1980, 36 y.o.   MRN: [id number]  CC: Caitlin Gomez is a 36 y.o. female who presents today for an acute visit.    HPI: CC: constipation, LLQ pain x intermittent 2 weeks, worsening over 2 days.   Last BM 3 weeks.   Laxatives are not working. No fever, chills, vomiting, dysuria, blood in stool.   H/o diverticulitis 'feels like diverticulitis' '100% sure this time.'   Prior hallucination with cipro- not sure if flagyl. Did well on bactrim in past.     CT abdomen 08/2016  Negative diverticulitis. Stable 37mm nodule LLL.   Non smoker.   HISTORY:  Past Medical History:  Diagnosis Date  . Chicken pox   . Depression   . Diabetes mellitus without complication ([place])   . Diverticulitis    12/2015  . Eating disorder   . Genital warts   . GERD (gastroesophageal reflux disease)   . Hypertension    Past Surgical History:  Procedure Laterality Date  . TONSILLECTOMY AND ADENOIDECTOMY     Family History  Problem Relation Age of Onset  . Alcohol abuse Mother   . Hyperlipidemia Mother   . Hypertension Mother   . Mental illness Mother   . Diabetes Mother   . Diabetes Father   . Mental illness Paternal Aunt   . Cancer Paternal Aunt   . Early death Maternal Grandmother   . Mental illness Maternal Grandmother   . Diabetes Maternal Grandmother   . Diabetes Maternal Grandfather   . Stroke Paternal Grandfather   . Diabetes Paternal Grandfather     Allergies: Amoxicillin; Ciprofloxacin; Clindamycin; Flagyl [metronidazole]; Kale; Lisinopril; Oysters [shellfish allergy]; Ziprasidone hcl; and Amoxicillin-pot clavulanate Current Outpatient Prescriptions on File Prior to Visit  Medication Sig Dispense Refill  . fluticasone (FLONASE) 50 MCG/ACT nasal spray Place 2 sprays into both nostrils daily. 16 g 2  . glipiZIDE (GLUCOTROL) 10 MG tablet TAKE 1 TABLET BY MOUTH 2 TIMES A DAY BEFORE MEALS 180 tablet 2  . glucose blood  (BAYER CONTOUR NEXT TEST) test strip Use 2 (two) times daily. 100 each 2  . lamoTRIgine (LAMICTAL) 100 MG tablet Take 125 mg by mouth 2 (two) times daily.     Marland Kitchen lisinopril (PRINIVIL,ZESTRIL) 10 MG tablet Take 1 tablet (10 mg total) by mouth daily. 90 tablet 1  . metFORMIN (GLUCOPHAGE) 500 MG tablet Take 1 tablet (500 mg total) by mouth 2 (two) times daily with a meal. 90 tablet 2  . montelukast (SINGULAIR) 10 MG tablet TAKE 1 TABLET BY MOUTH AT BEDTIME 90 tablet 1  . predniSONE (DELTASONE) 10 MG tablet Take 40 mg by mouth on day 1, then taper 10 mg daily until gone 10 tablet 0  . triamcinolone (KENALOG) 0.025 % cream Apply 1 application topically 2 (two) times daily. 15 g 0   No current facility-administered medications on file prior to visit.     Social History  Substance Use Topics  . Smoking status: Former Smoker    Types: Cigarettes  . Smokeless tobacco: Never Used  . Alcohol use Yes     Comment: one glass of wine once every 3 months    Review of Systems  Constitutional: Negative for chills and fever.  Respiratory: Negative for cough.   Cardiovascular: Negative for chest pain and palpitations.  Gastrointestinal: Positive for abdominal pain and constipation. Negative for abdominal distention, blood in stool, nausea and vomiting.  Genitourinary:  Negative for dysuria.      Objective:    BP 130/75   Pulse 94   Temp 98.5 F (36.9 C) (Oral)   Ht 5\' 6"  (1.676 m)   Wt 265 lb 3.2 oz (120.3 kg)   SpO2 97%   BMI 42.80 kg/m    Physical Exam  Constitutional: She appears well-developed and well-nourished.  Eyes: Conjunctivae are normal.  Cardiovascular: Normal rate, regular rhythm, normal heart sounds and normal pulses.   Pulmonary/Chest: Effort normal and breath sounds normal. She has no wheezes. She has no rhonchi. She has no rales.  Abdominal: Soft. Normal appearance and bowel sounds are normal. She exhibits no distension, no fluid wave, no ascites and no mass. There is  tenderness in the left lower quadrant. There is no rigidity, no rebound, no guarding and no CVA tenderness.  Neurological: She is alert.  Skin: Skin is warm and dry.  Psychiatric: She has a normal mood and affect. Her speech is normal and behavior is normal. Thought content normal.  Vitals reviewed.      Assessment & Plan:   1. Constipation, unspecified constipation type Afebrile and well-appearing. Patient does have localized pain in left lower quadrant. I strongly advised her to have CT abdomen and pelvis to confirm diverticulitis as well as to ensure no complications thereof. Patient is very polite however she declines imaging today as she is '100%' sure this is diverticulitis and would like to start antibiotics. We jointly agreed if she is not markedly better on antibiotics in the next 12-24 hrs, patient will call our office and let me know so we can pursue imaging at that time.   2. Lung nodule  We also discussed finding of left lung nodule 6 months ago, advised her to have a repeat CT chest to ensure stability. Patient politely declines at this time as well on getting call to let me know when she is ready to proceed. Non smoker.    Return precautions given. - metroNIDAZOLE (FLAGYL) 500 MG tablet; Take 1 tablet (500 mg total) by mouth 3 (three) times daily.  Dispense: 21 tablet; Refill: 0 - sulfamethoxazole-trimethoprim (BACTRIM DS) 800-160 MG tablet; Take 1 tablet by mouth 2 (two) times daily.  Dispense: 14 tablet; Refill: 0    I am having Ms. Albertsen start on metroNIDAZOLE and sulfamethoxazole-trimethoprim. I am also having her maintain her glipiZIDE, lamoTRIgine, metFORMIN, fluticasone, lisinopril, predniSONE, triamcinolone, glucose blood, and montelukast.   Meds ordered this encounter  Medications  . metroNIDAZOLE (FLAGYL) 500 MG tablet    Sig: Take 1 tablet (500 mg total) by mouth 3 (three) times daily.    Dispense:  21 tablet    Refill:  0    Order Specific Question:    Supervising Provider    Answer:   Deborra Medina L [[id number]]  . sulfamethoxazole-trimethoprim (BACTRIM DS) 800-160 MG tablet    Sig: Take 1 tablet by mouth 2 (two) times daily.    Dispense:  14 tablet    Refill:  0    Order Specific Question:   Supervising Provider    Answer:   Crecencio Mc [[id number]]    Return precautions given.   Risks, benefits, and alternatives of the medications and treatment plan prescribed today were discussed, and patient expressed understanding.   Education regarding symptom management and diagnosis given to patient on AVS.  Continue to follow with Burnard Hawthorne, FNP for routine health maintenance.   Caitlin Gomez and I agreed with plan.  Mable Paris, FNP

## 2017-02-18 NOTE — Patient Instructions (Addendum)
As discussed, my preference is for you to proceed with CT of abdomen and pelvis so we can confirm diverticulitis AND ensure there is no complications. Please let me know if you do not have marked improvement from antibiotics today and my advise would be to proceed with study.  As also discussed, left lower lung nodule, would recommend repeat study including CT of chest to ensure stability. Please call when you're ready to do this.  Ensure to take probiotics while on antibiotics and also for 2 weeks after completion. It is important to re-colonize the gut with good bacteria and also to prevent any diarrheal infections associated with antibiotic use.    If there is no improvement in your symptoms, or if there is any worsening of symptoms, or if you have any additional concerns, please return for re-evaluation; or, if we are closed, consider going to the Emergency Room for evaluation if symptoms urgent.

## 2017-03-04 ENCOUNTER — Encounter: Payer: Self-pay | Admitting: Family

## 2017-03-04 ENCOUNTER — Ambulatory Visit (INDEPENDENT_AMBULATORY_CARE_PROVIDER_SITE_OTHER): Payer: BLUE CROSS/BLUE SHIELD | Admitting: Family

## 2017-03-04 VITALS — BP 120/78 | HR 87 | Temp 98.3°F | Ht 66.0 in | Wt 259.8 lb

## 2017-03-04 DIAGNOSIS — E119 Type 2 diabetes mellitus without complications: Secondary | ICD-10-CM | POA: Diagnosis not present

## 2017-03-04 DIAGNOSIS — F319 Bipolar disorder, unspecified: Secondary | ICD-10-CM | POA: Diagnosis not present

## 2017-03-04 DIAGNOSIS — I10 Essential (primary) hypertension: Secondary | ICD-10-CM

## 2017-03-04 DIAGNOSIS — Z23 Encounter for immunization: Secondary | ICD-10-CM

## 2017-03-04 LAB — CBC WITH DIFFERENTIAL/PLATELET
Basophils Absolute: 0.1 10*3/uL (ref 0.0–0.1)
Basophils Relative: 1.3 % (ref 0.0–3.0)
EOS PCT: 4.7 % (ref 0.0–5.0)
Eosinophils Absolute: 0.3 10*3/uL (ref 0.0–0.7)
HEMATOCRIT: 37.7 % (ref 36.0–46.0)
HEMOGLOBIN: 12.5 g/dL (ref 12.0–15.0)
LYMPHS ABS: 2 10*3/uL (ref 0.7–4.0)
Lymphocytes Relative: 30.9 % (ref 12.0–46.0)
MCHC: 33.3 g/dL (ref 30.0–36.0)
MCV: 86.1 fl (ref 78.0–100.0)
MONOS PCT: 10.1 % (ref 3.0–12.0)
Monocytes Absolute: 0.7 10*3/uL (ref 0.1–1.0)
NEUTROS ABS: 3.5 10*3/uL (ref 1.4–7.7)
Neutrophils Relative %: 53 % (ref 43.0–77.0)
Platelets: 275 10*3/uL (ref 150.0–400.0)
RBC: 4.38 Mil/uL (ref 3.87–5.11)
RDW: 13.8 % (ref 11.5–15.5)
WBC: 6.5 10*3/uL (ref 4.0–10.5)

## 2017-03-04 LAB — COMPREHENSIVE METABOLIC PANEL
ALT: 27 U/L (ref 0–35)
AST: 19 U/L (ref 0–37)
Albumin: 4.1 g/dL (ref 3.5–5.2)
Alkaline Phosphatase: 38 U/L — ABNORMAL LOW (ref 39–117)
BUN: 8 mg/dL (ref 6–23)
CHLORIDE: 102 meq/L (ref 96–112)
CO2: 26 meq/L (ref 19–32)
Calcium: 9.2 mg/dL (ref 8.4–10.5)
Creatinine, Ser: 0.66 mg/dL (ref 0.40–1.20)
GFR: 108.61 mL/min (ref >60.00)
GLUCOSE: 109 mg/dL — AB (ref 70–99)
POTASSIUM: 4.2 meq/L (ref 3.5–5.1)
SODIUM: 137 meq/L (ref 135–145)
Total Bilirubin: 0.4 mg/dL (ref 0.2–1.2)
Total Protein: 7 g/dL (ref 6.0–8.3)

## 2017-03-04 LAB — HEMOGLOBIN A1C: Hgb A1c MFr Bld: 6.7 % — ABNORMAL HIGH (ref 4.6–6.5)

## 2017-03-04 MED ORDER — LISINOPRIL 10 MG PO TABS: 10.0000 mg | ORAL_TABLET | Freq: Every day | ORAL | 1 refills | Status: DC

## 2017-03-04 MED ORDER — GLIPIZIDE 10 MG PO TABS: ORAL_TABLET | ORAL | 3 refills | Status: DC

## 2017-03-04 MED ORDER — METFORMIN HCL 500 MG PO TABS: 500.0000 mg | ORAL_TABLET | Freq: Two times a day (BID) | ORAL | 4 refills | Status: DC

## 2017-03-04 NOTE — Progress Notes (Signed)
Subjective:    Patient ID: Caitlin Gomez, female    DOB: 1980-12-13, 36 y.o.   MRN: [id number]  CC: Caitlin Gomez is a 36 y.o. female who presents today for follow up.   HPI: Patient here for follow-up. She was last seen in our office for presumed flare of diverticulitis, antibiotics were given.Completed 4-5 days of antibiotic therapy, and stomach pain resolved. No fever, constipation, diarrhea. Sure diverticulitis is gone'.no thoughts of hurting herself or anyone else.  Felt like antibiotics affected sleep and made her more depressed. Thinks flagyl caused hallucinations. Mood has improved since stopping them.   DM - not checking blood sugars at home. Compliant with medications.  HTN- compliant with medication. Not checking blood pressure at home. Denies exertional chest pain or pressure, numbness or tingling radiating to left arm or jaw, palpitations, dizziness, frequent headaches, changes in vision, or shortness of breath.          HISTORY:  Past Medical History:  Diagnosis Date  . Chicken pox   . Depression   . Diabetes mellitus without complication ([place])   . Diverticulitis    12/2015  . Eating disorder   . Genital warts   . GERD (gastroesophageal reflux disease)   . Hypertension    Past Surgical History:  Procedure Laterality Date  . TONSILLECTOMY AND ADENOIDECTOMY     Family History  Problem Relation Age of Onset  . Alcohol abuse Mother   . Hyperlipidemia Mother   . Hypertension Mother   . Mental illness Mother   . Diabetes Mother   . Diabetes Father   . Mental illness Paternal Aunt   . Cancer Paternal Aunt   . Early death Maternal Grandmother   . Mental illness Maternal Grandmother   . Diabetes Maternal Grandmother   . Diabetes Maternal Grandfather   . Stroke Paternal Grandfather   . Diabetes Paternal Grandfather     Allergies: Amoxicillin; Ciprofloxacin; Clindamycin; Flagyl [metronidazole]; Kale; Lisinopril; Oysters [shellfish allergy]; Ziprasidone  hcl; and Amoxicillin-pot clavulanate Current Outpatient Prescriptions on File Prior to Visit  Medication Sig Dispense Refill  . fluticasone (FLONASE) 50 MCG/ACT nasal spray Place 2 sprays into both nostrils daily. 16 g 2  . glucose blood (BAYER CONTOUR NEXT TEST) test strip Use 2 (two) times daily. 100 each 2  . lamoTRIgine (LAMICTAL) 100 MG tablet Take 125 mg by mouth 2 (two) times daily.     . montelukast (SINGULAIR) 10 MG tablet TAKE 1 TABLET BY MOUTH AT BEDTIME 90 tablet 1   No current facility-administered medications on file prior to visit.     Social History  Substance Use Topics  . Smoking status: Former Smoker    Types: Cigarettes  . Smokeless tobacco: Never Used  . Alcohol use Yes     Comment: one glass of wine once every 3 months    Review of Systems  Constitutional: Negative for chills and fever.  Respiratory: Negative for cough.   Cardiovascular: Negative for chest pain and palpitations.  Gastrointestinal: Negative for abdominal pain, constipation, diarrhea, nausea and vomiting.      Objective:    BP 120/78   Pulse 87   Temp 98.3 F (36.8 C) (Oral)   Ht 5\' 6"  (1.676 m)   Wt 259 lb 12.8 oz (117.8 kg)   SpO2 95%   BMI 41.93 kg/m  BP Readings from Last 3 Encounters:  03/04/17 120/78  02/18/17 130/75  11/14/16 130/88   Wt Readings from Last 3 Encounters:  03/04/17 259  lb 12.8 oz (117.8 kg)  02/18/17 265 lb 3.2 oz (120.3 kg)  11/14/16 260 lb 6.4 oz (118.1 kg)    Physical Exam  Constitutional: She appears well-developed and well-nourished.  Eyes: Conjunctivae are normal.  Cardiovascular: Normal rate, regular rhythm, normal heart sounds and normal pulses.   Pulmonary/Chest: Effort normal and breath sounds normal. She has no wheezes. She has no rhonchi. She has no rales.  Neurological: She is alert.  Skin: Skin is warm and dry.  Psychiatric: She has a normal mood and affect. Her speech is normal and behavior is normal. Thought content normal.  Vitals  reviewed.      Assessment & Plan:   Problem List Items Addressed This Visit      Cardiovascular and Mediastinum   Essential hypertension    Controlled. Continue current regimen      Relevant Medications   lisinopril (PRINIVIL,ZESTRIL) 10 MG tablet   Other Relevant Orders   Comprehensive metabolic panel   CBC with Differential/Platelet     Endocrine   Diabetes ([place]) - Primary    Presume controlled.  Pending a1c.       Relevant Medications   lisinopril (PRINIVIL,ZESTRIL) 10 MG tablet   metFORMIN (GLUCOPHAGE) 500 MG tablet   glipiZIDE (GLUCOTROL) 10 MG tablet   Other Relevant Orders   Hemoglobin A1c     Other   Bipolar disorder ([place])    Back to baseline. Discussed side effects with Flagyl and hallucinations. Advised patient to have discussion with Dr Tama High as to how to handle diverticulitis in the future. Patient will discuss this with Dr.Balentine this week and let me know.       Other Visit Diagnoses    Need for immunization against influenza       Relevant Orders   Flu Vaccine QUAD 36+ mos IM (Completed)       I have discontinued Ms. Damron's predniSONE, triamcinolone, metroNIDAZOLE, and sulfamethoxazole-trimethoprim. I am also having her maintain her lamoTRIgine, fluticasone, glucose blood, montelukast, lisinopril, metFORMIN, and glipiZIDE.   Meds ordered this encounter  Medications  . lisinopril (PRINIVIL,ZESTRIL) 10 MG tablet    Sig: Take 1 tablet (10 mg total) by mouth daily.    Dispense:  90 tablet    Refill:  1    Order Specific Question:   Supervising Provider    Answer:   Deborra Medina L [[id number]]  . metFORMIN (GLUCOPHAGE) 500 MG tablet    Sig: Take 1 tablet (500 mg total) by mouth 2 (two) times daily with a meal.    Dispense:  120 tablet    Refill:  4    Order Specific Question:   Supervising Provider    Answer:   Deborra Medina L [[id number]]  . glipiZIDE (GLUCOTROL) 10 MG tablet    Sig: TAKE 1 TABLET BY MOUTH 2 TIMES A DAY BEFORE MEALS    Dispense:   180 tablet    Refill:  3    Order Specific Question:   Supervising Provider    Answer:   Crecencio Mc [[id number]]    Return precautions given.   Risks, benefits, and alternatives of the medications and treatment plan prescribed today were discussed, and patient expressed understanding.   Education regarding symptom management and diagnosis given to patient on AVS.  Continue to follow with Burnard Hawthorne, FNP for routine health maintenance.   Caitlin Gomez and I agreed with plan.   Mable Paris, FNP

## 2017-03-04 NOTE — Assessment & Plan Note (Signed)
Presume controlled.  Pending a1c.

## 2017-03-04 NOTE — Progress Notes (Signed)
Pre visit review using our clinic review tool, if applicable. No additional management support is needed unless otherwise documented below in the visit note. 

## 2017-03-04 NOTE — Assessment & Plan Note (Signed)
Controlled. Continue current regimen. 

## 2017-03-04 NOTE — Patient Instructions (Signed)
Labs today  As discussed, please discuss with your psychiatrist a plan for when you're on antibiotics in the future as very concerned about hallucinations that you had. We discussed possibly having an as needed dose of Seroquel, please discuss with your psychiatrist what she thinks is reasonable, safe and appropriate.  Follow up 3 months

## 2017-03-04 NOTE — Assessment & Plan Note (Addendum)
Back to baseline. Discussed side effects with Flagyl and hallucinations. Advised patient to have discussion with Dr Tama High as to how to handle diverticulitis in the future. Patient will discuss this with Dr.Balentine this week and let me know.

## 2017-03-07 ENCOUNTER — Encounter: Payer: Self-pay | Admitting: Family

## 2017-04-26 ENCOUNTER — Encounter: Payer: Self-pay | Admitting: Family Medicine

## 2017-04-26 ENCOUNTER — Ambulatory Visit: Payer: BLUE CROSS/BLUE SHIELD | Admitting: Family Medicine

## 2017-04-26 ENCOUNTER — Ambulatory Visit: Payer: Self-pay

## 2017-04-26 VITALS — BP 138/92 | Temp 98.1°F | Ht 66.6 in | Wt 270.0 lb

## 2017-04-26 DIAGNOSIS — J324 Chronic pansinusitis: Secondary | ICD-10-CM | POA: Diagnosis not present

## 2017-04-26 MED ORDER — AZITHROMYCIN 250 MG PO TABS: ORAL_TABLET | ORAL | 0 refills | Status: DC

## 2017-04-26 NOTE — Progress Notes (Signed)
Patient ID: Caitlin Gomez, female    DOB: 1980-06-14  Age: 36 y.o. MRN: [id number]    Subjective:  Subjective  HPI Caitlin Gomez presents for sinus pressure and ear pressure since Tuesday.  Taking otc with some relief.  No fever, no chillse.  Review of Systems  Constitutional: Positive for chills. Negative for fever.  HENT: Positive for congestion, postnasal drip, rhinorrhea, sinus pressure and sore throat.   Respiratory: Negative for cough, chest tightness, shortness of breath and wheezing.   Cardiovascular: Negative for chest pain, palpitations and leg swelling.  Allergic/Immunologic: Negative for environmental allergies.  Neurological: Positive for headaches.    History Past Medical History:  Diagnosis Date  . Chicken pox   . Depression   . Diabetes mellitus without complication ([place])   . Diverticulitis    12/2015  . Eating disorder   . Genital warts   . GERD (gastroesophageal reflux disease)   . Hypertension     She has a past surgical history that includes Tonsillectomy and adenoidectomy.   Her family history includes Alcohol abuse in her mother; Cancer in her paternal aunt; Diabetes in her father, maternal grandfather, maternal grandmother, mother, and paternal grandfather; Early death in her maternal grandmother; Hyperlipidemia in her mother; Hypertension in her mother; Mental illness in her maternal grandmother, mother, and paternal aunt; Stroke in her paternal grandfather.She reports that she has quit smoking. Her smoking use included cigarettes. she has never used smokeless tobacco. She reports that she drinks alcohol. She reports that she does not use drugs.  Current Outpatient Medications on File Prior to Visit  Medication Sig Dispense Refill  . glipiZIDE (GLUCOTROL) 10 MG tablet TAKE 1 TABLET BY MOUTH 2 TIMES A DAY BEFORE MEALS 180 tablet 3  . glucose blood (BAYER CONTOUR NEXT TEST) test strip Use 2 (two) times daily. 100 each 2  . lamoTRIgine (LAMICTAL) 100 MG  tablet Take 125 mg by mouth 2 (two) times daily.     Marland Kitchen lisinopril (PRINIVIL,ZESTRIL) 10 MG tablet Take 1 tablet (10 mg total) by mouth daily. 90 tablet 1  . metFORMIN (GLUCOPHAGE) 500 MG tablet Take 1 tablet (500 mg total) by mouth 2 (two) times daily with a meal. 120 tablet 4   No current facility-administered medications on file prior to visit.      Objective:  Objective  Physical Exam  Constitutional: She is oriented to person, place, and time. She appears well-developed and well-nourished. No distress.  HENT:  Head: Normocephalic and atraumatic.  Right Ear: Tympanic membrane and external ear normal.  Left Ear: Tympanic membrane and external ear normal.  Nose: Right sinus exhibits no maxillary sinus tenderness and no frontal sinus tenderness. Left sinus exhibits maxillary sinus tenderness and frontal sinus tenderness.  Mouth/Throat: Uvula is midline and mucous membranes are normal. Posterior oropharyngeal erythema present. No oropharyngeal exudate.  Eyes: Conjunctivae and EOM are normal. Pupils are equal, round, and reactive to light.  Neck: Normal range of motion. Neck supple.  Cardiovascular: Normal rate, regular rhythm and normal heart sounds.  No murmur heard. Pulmonary/Chest: Effort normal and breath sounds normal. No respiratory distress. She has no wheezes. She has no rales. She exhibits no tenderness.  Lymphadenopathy:    She has no cervical adenopathy.  Neurological: She is alert and oriented to person, place, and time.  Psychiatric: She has a normal mood and affect. Her behavior is normal. Judgment and thought content normal.  Nursing note and vitals reviewed.  BP (!) 138/92   Temp 98.1  F (36.7 C)   Ht 5' 6.6" (1.692 m)   Wt 270 lb (122.5 kg)   BMI 42.80 kg/m  Wt Readings from Last 3 Encounters:  04/26/17 270 lb (122.5 kg)  03/04/17 259 lb 12.8 oz (117.8 kg)  02/18/17 265 lb 3.2 oz (120.3 kg)     Lab Results  Component Value Date   WBC 6.5 03/04/2017   HGB  12.5 03/04/2017   HCT 37.7 03/04/2017   PLT 275.0 03/04/2017   GLUCOSE 109 (H) 03/04/2017   CHOL 183 03/05/2016   TRIG 184.0 (H) 03/05/2016   HDL 32.90 (L) 03/05/2016   LDLCALC 114 (H) 03/05/2016   ALT 27 03/04/2017   AST 19 03/04/2017   NA 137 03/04/2017   K 4.2 03/04/2017   CL 102 03/04/2017   CREATININE 0.66 03/04/2017   BUN 8 03/04/2017   CO2 26 03/04/2017   TSH 2.19 03/05/2016   HGBA1C 6.7 (H) 03/04/2017    Ct Abdomen Pelvis W Contrast  Result Date: 08/06/2016 CLINICAL DATA:  36 y/o F; lower abdominal pain with diarrhea for 1 week. History of diverticulitis. EXAM: CT ABDOMEN AND PELVIS WITH CONTRAST TECHNIQUE: Multidetector CT imaging of the abdomen and pelvis was performed using the standard protocol following bolus administration of intravenous contrast. CONTRAST:  143mL ISOVUE-300 IOPAMIDOL (ISOVUE-300) INJECTION 61% COMPARISON:  02/11/2010 CT of the abdomen and pelvis. FINDINGS: Lower chest: Stable 3 mm nodule in the left lower lobe (series 4, image 6). Hepatobiliary: Hepatic steatosis. No focal liver lesion. Normal gallbladder. No intra or extrahepatic biliary ductal dilatation. Pancreas: Unremarkable. No pancreatic ductal dilatation or surrounding inflammatory changes. Spleen: Normal in size without focal abnormality. Adrenals/Urinary Tract: Adrenal glands are unremarkable. Kidneys are normal, without renal calculi, focal lesion, or hydronephrosis. Bladder is unremarkable. Stomach/Bowel: Stomach is within normal limits. Appendix appears normal. No evidence of bowel wall thickening, distention, or inflammatory changes. Scattered sigmoid diverticulosis. Vascular/Lymphatic: No significant vascular findings are present. No enlarged abdominal or pelvic lymph nodes. Reproductive: Uterus and bilateral adnexa are unremarkable. Other: Midline ventral moderate-sized supraumbilical hernia with 5 mm neck containing fat. Musculoskeletal: No acute or significant osseous findings. IMPRESSION: 1. No  acute process identified as explanation for abdominal pain. 2. Scattered diverticulosis, no evidence for acute diverticulitis. 3. Hepatic steatosis. 4. Moderate sized midline ventral supraumbilical hernia containing fat. Electronically Signed   By: Kristine Garbe M.D.   On: 08/06/2016 19:31     Assessment & Plan:  Plan  I have discontinued Dana B. Tietze's fluticasone and montelukast. I am also having her start on azithromycin. Additionally, I am having her maintain her lamoTRIgine, glucose blood, lisinopril, metFORMIN, and glipiZIDE.  Meds ordered this encounter  Medications  . azithromycin (ZITHROMAX Z-PAK) 250 MG tablet    Sig: As directed    Dispense:  6 each    Refill:  0    Problem List Items Addressed This Visit    None    Visit Diagnoses    Pansinusitis, unspecified chronicity    -  Primary   Relevant Medications   azithromycin (ZITHROMAX Z-PAK) 250 MG tablet    start using flonase again con't claritin F/u next week with pcp if no improvement  Follow-up: Return if symptoms worsen or fail to improve.  Ann Held, DO

## 2017-04-26 NOTE — Telephone Encounter (Signed)
  Reason for Disposition . Earache  Answer Assessment - Initial Assessment Questions 1. LOCATION: "Where does it hurt?"      Left side of throat and left side of face left ear also headaches 2. ONSET: "When did the sinus pain start?"  (e.g., hours, days)      2 days ago with pressure  3. SEVERITY: "How bad is the pain?"   (Scale 1-10; mild, moderate or severe)   - MILD (1-3): doesn't interfere with normal activities    - MODERATE (4-7): interferes with normal activities (e.g., work or school) or awakens from sleep   - SEVERE (8-10): excruciating pain and patient unable to do any normal activities        Ear=3 throat=2 headache=2 left side of face=4 4. RECURRENT SYMPTOM: "Have you ever had sinus problems before?" If so, ask: "When was the last time?" and "What happened that time?"      Yes every year last March was the last time; was placed on antibiotics  5. NASAL CONGESTION: "Is the nose blocked?" If so, ask, "Can you open it or must you breathe through the mouth?"    Goes from dry then will run a little but mainly running down  Having clear drainage from left says it was when she woke up "very little " 6. NASAL DISCHARGE: "Do you have discharge from your nose?" If so ask, "What color?"     Slight drainage clear in color . C/O post nasal drip thick and green noted when pt clears throat 7. FEVER: "Do you have a fever?" If so, ask: "What is it, how was it measured, and when did it start?"      No fever or chills  8. OTHER SYMPTOMS: "Do you have any other symptoms?" (e.g., sore throat, cough, earache, difficulty breathing)     Sore throat, no cough, earache  9. PREGNANCY: "Is there any chance you are pregnant?" "When was your last menstrual period?"     No LMP: 2 weeks ago  Protocols used: [place]

## 2017-04-26 NOTE — Patient Instructions (Signed)

## 2017-07-04 ENCOUNTER — Ambulatory Visit: Payer: BLUE CROSS/BLUE SHIELD | Admitting: Primary Care

## 2017-07-04 ENCOUNTER — Encounter: Payer: Self-pay | Admitting: Primary Care

## 2017-07-04 VITALS — BP 130/82 | HR 81 | Temp 97.9°F | Ht 66.0 in | Wt 271.8 lb

## 2017-07-04 DIAGNOSIS — J029 Acute pharyngitis, unspecified: Secondary | ICD-10-CM | POA: Diagnosis not present

## 2017-07-04 LAB — POCT RAPID STREP A (OFFICE): RAPID STREP A SCREEN: NEGATIVE

## 2017-07-04 NOTE — Patient Instructions (Signed)
Your symptoms are representative of a viral illness which will resolve on its own over time. Our goal is to treat your symptoms in order to aid your body in the healing process and to make you more comfortable.   Continue Ibuprofen as needed for pain and inflammation. You can also try warm salt gargles, throat lozenges, chloraseptic spray.  I'll be in touch as soon as I receive the throat culture results.  It was a pleasure meeting you!

## 2017-07-04 NOTE — Addendum Note (Signed)
Addended by: Jacqualin Combes on: 07/04/2017 09:39 AM   Modules accepted: Orders

## 2017-07-04 NOTE — Progress Notes (Signed)
Subjective:    Patient ID: Caitlin Gomez, female    DOB: 12/09/1980, 37 y.o.   MRN: [id number]  HPI  Ms. Doke is a 37 year old female with a history of allergic rhinitis and type 2 diabetes who presents today with a chief complaint of sore throat.   She also reports ear pain, chills. Her symptoms began Saturday, progressed yesterday. Her daughter was diagnosed with strep throat Monday last week and was treated one week ago. Her daughter had a negative rapid strep but her culture turned up positive. She's not checking her temperature, but has noticed chills. She's been taking Ibuprofen with some improvement. She denies cough, congestion.  Review of Systems  Constitutional: Positive for chills. Negative for fatigue and fever.  HENT: Positive for sore throat. Negative for congestion.   Respiratory: Negative for cough and shortness of breath.   Cardiovascular: Negative for chest pain.  Neurological: Positive for headaches.       Past Medical History:  Diagnosis Date  . Chicken pox   . Depression   . Diabetes mellitus without complication ([place])   . Diverticulitis    12/2015  . Eating disorder   . Genital warts   . GERD (gastroesophageal reflux disease)   . Hypertension      Social History   Socioeconomic History  . Marital status: Married    Spouse name: Not on file  . Number of children: Not on file  . Years of education: Not on file  . Highest education level: Not on file  Social Needs  . Financial resource strain: Not on file  . Food insecurity - worry: Not on file  . Food insecurity - inability: Not on file  . Transportation needs - medical: Not on file  . Transportation needs - non-medical: Not on file  Occupational History  . Not on file  Tobacco Use  . Smoking status: Former Smoker    Types: Cigarettes  . Smokeless tobacco: Never Used  Substance and Sexual Activity  . Alcohol use: Yes    Comment: one glass of wine once every 3 months  . Drug use: No  .  Sexual activity: Not on file  Other Topics Concern  . Not on file  Social History Narrative   Born in [place] and has  Lived here since 37 years old.       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       Reads a lot.       Married.      4 year old daughter.        Past Surgical History:  Procedure Laterality Date  . TONSILLECTOMY AND ADENOIDECTOMY      Family History  Problem Relation Age of Onset  . Alcohol abuse Mother   . Hyperlipidemia Mother   . Hypertension Mother   . Mental illness Mother   . Diabetes Mother   . Diabetes Father   . Mental illness Paternal Aunt   . Cancer Paternal Aunt   . Early death Maternal Grandmother   . Mental illness Maternal Grandmother   . Diabetes Maternal Grandmother   . Diabetes Maternal Grandfather   . Stroke Paternal Grandfather   . Diabetes Paternal Grandfather     Allergies  Allergen Reactions  . Amoxicillin Other (See Comments)    Chest pain   . Ciprofloxacin     hallucinations  . Clindamycin Swelling  . Flagyl [Metronidazole]     hallucinations  . Irene Limbo  Tongue swelling  . Oysters [Shellfish Allergy]     Dizzy, nauseated  . Ziprasidone Hcl Swelling  . Amoxicillin-Pot Clavulanate Rash    Current Outpatient Medications on File Prior to Visit  Medication Sig Dispense Refill  . glipiZIDE (GLUCOTROL) 10 MG tablet TAKE 1 TABLET BY MOUTH 2 TIMES A DAY BEFORE MEALS 180 tablet 3  . glucose blood (BAYER CONTOUR NEXT TEST) test strip Use 2 (two) times daily. 100 each 2  . lamoTRIgine (LAMICTAL) 100 MG tablet Take 125 mg by mouth 2 (two) times daily.     Marland Kitchen lisinopril (PRINIVIL,ZESTRIL) 10 MG tablet Take 1 tablet (10 mg total) by mouth daily. 90 tablet 1  . metFORMIN (GLUCOPHAGE) 500 MG tablet Take 1 tablet (500 mg total) by mouth 2 (two) times daily with a meal. 120 tablet 4  . montelukast (SINGULAIR) 10 MG tablet Take 10 mg by mouth at bedtime.     No current facility-administered medications on file prior to visit.     BP  130/82   Pulse 81   Temp 97.9 F (36.6 C) (Oral)   Ht 5\' 6"  (1.676 m)   Wt 271 lb 12 oz (123.3 kg)   SpO2 99%   BMI 43.86 kg/m    Objective:   Physical Exam  Constitutional: She appears well-nourished. She appears ill.  HENT:  Right Ear: Tympanic membrane and ear canal normal.  Left Ear: Tympanic membrane and ear canal normal.  Nose: Right sinus exhibits no maxillary sinus tenderness and no frontal sinus tenderness. Left sinus exhibits no maxillary sinus tenderness and no frontal sinus tenderness.  Mouth/Throat: Oropharynx is clear and moist. No oropharyngeal exudate, posterior oropharyngeal edema or posterior oropharyngeal erythema.  Eyes: Conjunctivae are normal.  Neck: Neck supple.  Cardiovascular: Normal rate and regular rhythm.  Pulmonary/Chest: Effort normal and breath sounds normal. She has no wheezes. She has no rales.  Lymphadenopathy:    She has no cervical adenopathy.  Skin: Skin is warm and dry.          Assessment & Plan:  Sore Throat:  Present for the past 5 days, worse yesterday. Exam today without obvious bacterial/strep involvement.  Rapid Strep: Negative Will also send off culture given daughter's history. Will treat as viral illness for now. Continue Ibuprofen. Discussed warm salt gargles, throat lozenges.  Pleas Koch, NP

## 2017-07-06 LAB — CULTURE, GROUP A STREP
MICRO NUMBER:: 90133830
SPECIMEN QUALITY: ADEQUATE

## 2017-08-01 ENCOUNTER — Ambulatory Visit: Payer: Self-pay | Admitting: *Deleted

## 2017-08-01 NOTE — Telephone Encounter (Signed)
Noted  

## 2017-08-01 NOTE — Telephone Encounter (Addendum)
Called pt regarding diverticulitis; she states that she ate sesame seeds on Friday  07/26/17 and she took miralax for the symptoms and they went away; she states that she ate sesame seeds yesterday 08/01/27 and the symptoms returned: sharp left side pain, and a "pulling" sensation across her lower abdomen; she also describes it as a dull ache or cramp; she states that it feels like she felt she had diverticulitis;  the pt states that she has had flu-like symptoms and she assumed she had a fever because she was having chills that started on Monday 07/29/17; recommendations made per nurse triage to include seeing a physician within 24 hours; pt normally sees Mable Paris but no appointments for that provider or practice within the suggested parameters (spoke with Janett Billow); her second choice was Allie Bossier at [facility] but no appointments were available; pt offered and accepted appointment with Dr Diona Browner at [facility] on 08/02/17 at 0900; pt verbalizes understanding.   Reason for Disposition . [1] MODERATE pain (e.g., interferes with normal activities) AND [2] pain comes and goes (cramps) AND [3] present > 24 hours  (Exception: pain with Vomiting or Diarrhea - see that Guideline)  Answer Assessment - Initial Assessment Questions 1. LOCATION: "Where does it hurt?"      Across lower abdomen and left side 2. RADIATION: "Does the pain shoot anywhere else?" (e.g., chest, back)     no 3. ONSET: "When did the pain begin?" (e.g., minutes, hours or days ago)      Started around 2030 on 07/31/17 4. SUDDEN: "Gradual or sudden onset?"     gradual 5. PATTERN "Does the pain come and go, or is it constant?"    - If constant: "Is it getting better, staying the same, or worsening?"      (Note: Constant means the pain never goes away completely; most serious pain is constant and it progresses)     - If intermittent: "How long does it last?" "Do you have pain now?"     (Note: Intermittent means the pain goes  away completely between bouts)     Comes and goes 6. SEVERITY: "How bad is the pain?"  (e.g., Scale 1-10; mild, moderate, or severe)   - MILD (1-3): doesn't interfere with normal activities, abdomen soft and not tender to touch    - MODERATE (4-7): interferes with normal activities or awakens from sleep, tender to touch    - SEVERE (8-10): excruciating pain, doubled over, unable to do any normal activities      Mild to moderate 7. RECURRENT SYMPTOM: "Have you ever had this type of abdominal pain before?" If so, ask: "When was the last time?" and "What happened that time?"      Yes diverticulitis 8. CAUSE: "What do you think is causing the abdominal pain?"     ? diverticulitis 9. RELIEVING/AGGRAVATING FACTORS: "What makes it better or worse?" (e.g., movement, antacids, bowel movement)     Having a bowel movement makes it better for a little while; feels constipated 10. OTHER SYMPTOMS: "Has there been any vomiting, diarrhea, constipation, or urine problems?"       Constipation (took miralax fri, sat, and sun; then took last pm 07/31/17 at 1900)  11. PREGNANCY: "Is there any chance you are pregnant?" "When was your last menstrual period?"       No LMP 07/11/17  Protocols used: ABDOMINAL PAIN - [facility]

## 2017-08-01 NOTE — Telephone Encounter (Signed)
Pt has 15' appt with Dr Diona Browner on 08/02/17 at 9  Am by [facility].

## 2017-08-01 NOTE — Telephone Encounter (Signed)
fyi

## 2017-08-02 ENCOUNTER — Encounter: Payer: Self-pay | Admitting: Family Medicine

## 2017-08-02 ENCOUNTER — Ambulatory Visit: Payer: BLUE CROSS/BLUE SHIELD | Admitting: Family Medicine

## 2017-08-02 ENCOUNTER — Other Ambulatory Visit: Payer: Self-pay

## 2017-08-02 DIAGNOSIS — K5792 Diverticulitis of intestine, part unspecified, without perforation or abscess without bleeding: Secondary | ICD-10-CM

## 2017-08-02 MED ORDER — CIPROFLOXACIN HCL 750 MG PO TABS: 750.0000 mg | ORAL_TABLET | Freq: Two times a day (BID) | ORAL | 0 refills | Status: DC

## 2017-08-02 MED ORDER — METRONIDAZOLE 500 MG PO TABS: 500.0000 mg | ORAL_TABLET | Freq: Three times a day (TID) | ORAL | 0 refills | Status: DC

## 2017-08-02 NOTE — Patient Instructions (Signed)
Complete a course of antibiotics. Take Seroquel at bedtime to treat medication side effect. Push fluids.

## 2017-08-02 NOTE — Telephone Encounter (Signed)
noted 

## 2017-08-02 NOTE — Progress Notes (Signed)
Subjective:    Patient ID: Caitlin Gomez, female    DOB: [birth year], 37 y.o.   MRN: [id number]  Abdominal Pain  This is a new problem. The current episode started in the past 7 days. The problem has been waxing and waning. The pain is located in the LLQ. The pain is at a severity of 3/10. The quality of the pain is cramping and sharp. The abdominal pain does not radiate. Associated symptoms include constipation. Pertinent negatives include no belching, dysuria, fever, headaches, hematuria, nausea or vomiting. Associated symptoms comments:  She  as been having chills and cold symptoms in last week. ST better, but has cough and post nasal drip   had a bowel movement this AM.. The pain is aggravated by movement. The pain is relieved by nothing. She has tried acetaminophen for the symptoms. The treatment provided moderate relief. There is no history of abdominal surgery, colon cancer, GERD, irritable bowel syndrome or pancreatitis.  diverticulitis last in 02/2018    Ate seeds few days before.   This feels like diverticulitis she has had in past.   She is drinking water,  Decreased appetite.  She has had  Hallucination cipro and flagyl in past but does not tolerate other antibiotics.  She has seroquel she can take with it.   Blood pressure 130/80, pulse (!) 110, temperature 98.2 F (36.8 C), temperature source Oral, height 5\' 6"  (1.676 m), weight 268 lb (121.6 kg).   Review of Systems  Constitutional: Negative for fever.  Gastrointestinal: Positive for abdominal pain and constipation. Negative for nausea and vomiting.  Genitourinary: Negative for dysuria and hematuria.  Neurological: Negative for headaches.       Objective:   Physical Exam  Constitutional: Vital signs are normal. She appears well-developed and well-nourished. She is cooperative.  Non-toxic appearance. She does not appear ill. No distress.  HENT:  Head: Normocephalic.  Right Ear: Hearing, tympanic membrane, external  ear and ear canal normal. Tympanic membrane is not erythematous, not retracted and not bulging.  Left Ear: Hearing, tympanic membrane, external ear and ear canal normal. Tympanic membrane is not erythematous, not retracted and not bulging.  Nose: Mucosal edema and rhinorrhea present. Right sinus exhibits no maxillary sinus tenderness and no frontal sinus tenderness. Left sinus exhibits no maxillary sinus tenderness and no frontal sinus tenderness.  Mouth/Throat: Uvula is midline, oropharynx is clear and moist and mucous membranes are normal.  Eyes: Conjunctivae, EOM and lids are normal. Pupils are equal, round, and reactive to light. Lids are everted and swept, no foreign bodies found.  Neck: Trachea normal and normal range of motion. Neck supple. Carotid bruit is not present. No thyroid mass and no thyromegaly present.  Cardiovascular: Normal rate, regular rhythm, S1 normal, S2 normal, normal heart sounds, intact distal pulses and normal pulses. Exam reveals no gallop and no friction rub.  No murmur heard. Pulmonary/Chest: Effort normal and breath sounds normal. No tachypnea. No respiratory distress. She has no decreased breath sounds. She has no wheezes. She has no rhonchi. She has no rales.  Abdominal: There is no hepatosplenomegaly. There is tenderness in the left lower quadrant. There is guarding. There is no rigidity, no rebound and no CVA tenderness.  Neurological: She is alert.  Skin: Skin is warm, dry and intact. No rash noted.  Psychiatric: Her speech is normal and behavior is normal. Judgment normal. Her mood appears not anxious. Cognition and memory are normal. She does not exhibit a depressed mood.  Assessment & Plan:

## 2017-08-02 NOTE — Assessment & Plan Note (Signed)
Treat with cipro and flagyl.. Will try shorter course given past hallucination SE with medication. She will take seroquel she has been given for this reason by her psychiatrist. Push fluids.  Follow up in 1 week with PCP.

## 2017-08-08 ENCOUNTER — Encounter: Payer: Self-pay | Admitting: Family Medicine

## 2017-08-08 ENCOUNTER — Other Ambulatory Visit: Payer: Self-pay

## 2017-08-08 ENCOUNTER — Telehealth: Payer: Self-pay | Admitting: Family

## 2017-08-08 ENCOUNTER — Ambulatory Visit: Payer: BLUE CROSS/BLUE SHIELD | Admitting: Family Medicine

## 2017-08-08 DIAGNOSIS — B3731 Acute candidiasis of vulva and vagina: Secondary | ICD-10-CM

## 2017-08-08 DIAGNOSIS — B373 Candidiasis of vulva and vagina: Secondary | ICD-10-CM

## 2017-08-08 DIAGNOSIS — K5792 Diverticulitis of intestine, part unspecified, without perforation or abscess without bleeding: Secondary | ICD-10-CM

## 2017-08-08 MED ORDER — FLUCONAZOLE 150 MG PO TABS: 150.0000 mg | ORAL_TABLET | Freq: Once | ORAL | 0 refills | Status: AC

## 2017-08-08 NOTE — Telephone Encounter (Signed)
Pt is requesting to transfer care to Dr. Eliezer Lofts. Pt stated that Dr.Bedsole is ok with this transfer, but I told her that we would need to check with both parties before we could establish care. Is this ok?

## 2017-08-08 NOTE — Assessment & Plan Note (Signed)
Resolved on antibiotics.  Discussed prevention of recurrence.

## 2017-08-08 NOTE — Telephone Encounter (Signed)
No clear red flags to transfer of care. I will await Ms. Arnett's recommendations before accepting transfer.

## 2017-08-08 NOTE — Progress Notes (Signed)
   Subjective:    Patient ID: Caitlin Gomez, female    DOB: [birth year], 37 y.o.   MRN: [id number]  HPI   37 year old female pt presents for 1 week follow up diverticulitis.  She was treated with cipro and flagyl Sharma Covert not have to useseroquel with it to prevent hallucination SE as had in past).   Today she reports she has had significant improvement in abd pain.  No fever, nausea as SE to the medication,  some diarrhea, no constipation. no blood in stool.   She currently has a yeast infection.. Has thick whitr discharge and vaginal itching.    Blood pressure 122/80, pulse 84, temperature 98.3 F (36.8 C), temperature source Oral, height 5\' 6"  (1.676 m), weight 268 lb 4 oz (121.7 kg). Social History /Family History/Past Medical History reviewed in detail and updated in EMR if needed.  Review of Systems  Constitutional: Negative for fatigue and fever.  HENT: Negative for ear pain.   Eyes: Negative for pain.  Respiratory: Negative for chest tightness and shortness of breath.   Cardiovascular: Negative for chest pain, palpitations and leg swelling.  Gastrointestinal: Negative for abdominal pain.  Genitourinary: Negative for dysuria.       Objective:   Physical Exam  Constitutional: Vital signs are normal. She appears well-developed and well-nourished. She is cooperative.  Non-toxic appearance. She does not appear ill. No distress.  HENT:  Head: Normocephalic.  Right Ear: Hearing, tympanic membrane, external ear and ear canal normal. Tympanic membrane is not erythematous, not retracted and not bulging.  Left Ear: Hearing, tympanic membrane, external ear and ear canal normal. Tympanic membrane is not erythematous, not retracted and not bulging.  Nose: No mucosal edema or rhinorrhea. Right sinus exhibits no maxillary sinus tenderness and no frontal sinus tenderness. Left sinus exhibits no maxillary sinus tenderness and no frontal sinus tenderness.  Mouth/Throat: Uvula is midline,  oropharynx is clear and moist and mucous membranes are normal.  Eyes: Conjunctivae, EOM and lids are normal. Pupils are equal, round, and reactive to light. Lids are everted and swept, no foreign bodies found.  Neck: Trachea normal and normal range of motion. Neck supple. Carotid bruit is not present. No thyroid mass and no thyromegaly present.  Cardiovascular: Normal rate, regular rhythm, S1 normal, S2 normal, normal heart sounds, intact distal pulses and normal pulses. Exam reveals no gallop and no friction rub.  No murmur heard. Pulmonary/Chest: Effort normal and breath sounds normal. No tachypnea. No respiratory distress. She has no decreased breath sounds. She has no wheezes. She has no rhonchi. She has no rales.  Abdominal: Soft. Normal appearance and bowel sounds are normal. There is no tenderness.  Neurological: She is alert.  Skin: Skin is warm, dry and intact. No rash noted.  Psychiatric: Her speech is normal and behavior is normal. Judgment and thought content normal. Her mood appears not anxious. Cognition and memory are normal. She does not exhibit a depressed mood.          Assessment & Plan:

## 2017-08-08 NOTE — Telephone Encounter (Signed)
She is a wonderful patient - happy to she is with you Dr Diona Browner.  Transfer is fine

## 2017-08-08 NOTE — Assessment & Plan Note (Signed)
Likely yeast infection given recent antibiotics. Will treat with oral diflucan.

## 2017-08-08 NOTE — Patient Instructions (Addendum)
Push fluids. Complete antibiotics. Take diflucan tomorrow once antibiotics completed.

## 2017-08-09 NOTE — Telephone Encounter (Signed)
Okay for transfer.. Please schedule a 30 min appt to establish care in next 2-3 months.

## 2017-08-13 NOTE — Telephone Encounter (Signed)
Appointment made 4/4

## 2017-08-14 ENCOUNTER — Ambulatory Visit: Payer: Self-pay | Admitting: Family

## 2017-09-05 ENCOUNTER — Encounter: Payer: Self-pay | Admitting: Family Medicine

## 2017-09-05 ENCOUNTER — Other Ambulatory Visit: Payer: Self-pay

## 2017-09-05 ENCOUNTER — Ambulatory Visit: Payer: BLUE CROSS/BLUE SHIELD | Admitting: Family Medicine

## 2017-09-05 DIAGNOSIS — Z6841 Body Mass Index (BMI) 40.0 and over, adult: Secondary | ICD-10-CM | POA: Diagnosis not present

## 2017-09-05 DIAGNOSIS — E119 Type 2 diabetes mellitus without complications: Secondary | ICD-10-CM

## 2017-09-05 DIAGNOSIS — F319 Bipolar disorder, unspecified: Secondary | ICD-10-CM

## 2017-09-05 DIAGNOSIS — Z8719 Personal history of other diseases of the digestive system: Secondary | ICD-10-CM | POA: Insufficient documentation

## 2017-09-05 DIAGNOSIS — K760 Fatty (change of) liver, not elsewhere classified: Secondary | ICD-10-CM

## 2017-09-05 DIAGNOSIS — I10 Essential (primary) hypertension: Secondary | ICD-10-CM

## 2017-09-05 LAB — COMPREHENSIVE METABOLIC PANEL
ALBUMIN: 4.2 g/dL (ref 3.5–5.2)
ALK PHOS: 43 U/L (ref 39–117)
ALT: 42 U/L — AB (ref 0–35)
AST: 27 U/L (ref 0–37)
BILIRUBIN TOTAL: 0.3 mg/dL (ref 0.2–1.2)
BUN: 9 mg/dL (ref 6–23)
CALCIUM: 9.6 mg/dL (ref 8.4–10.5)
CO2: 26 mEq/L (ref 19–32)
CREATININE: 0.67 mg/dL (ref 0.40–1.20)
Chloride: 104 mEq/L (ref 96–112)
GFR: 106.43 mL/min (ref >60.00)
Glucose, Bld: 125 mg/dL — ABNORMAL HIGH (ref 70–99)
Potassium: 4.3 mEq/L (ref 3.5–5.1)
SODIUM: 138 meq/L (ref 135–145)
TOTAL PROTEIN: 7.2 g/dL (ref 6.0–8.3)

## 2017-09-05 LAB — LIPID PANEL
CHOLESTEROL: 196 mg/dL (ref 0–200)
HDL: 34.8 mg/dL — ABNORMAL LOW (ref >39.00)
NonHDL: 161.62
Total CHOL/HDL Ratio: 6
Triglycerides: 335 mg/dL — ABNORMAL HIGH (ref 0.0–149.0)
VLDL: 67 mg/dL — AB (ref 0.0–40.0)

## 2017-09-05 LAB — HEMOGLOBIN A1C: Hgb A1c MFr Bld: 8.3 % — ABNORMAL HIGH (ref 4.6–6.5)

## 2017-09-05 LAB — LDL CHOLESTEROL, DIRECT: Direct LDL: 126 mg/dL

## 2017-09-05 LAB — HM DIABETES FOOT EXAM

## 2017-09-05 NOTE — Patient Instructions (Addendum)
Please stop at the lab to have labs drawn. Makes ure to set up yearly eye exam.

## 2017-09-05 NOTE — Assessment & Plan Note (Signed)
Follows with Dr Tama High in [facility] control on lam ictal and Seroquel prn.

## 2017-09-05 NOTE — Assessment & Plan Note (Signed)
Disscused lifestyle changes and weight loss as well as associated risks.

## 2017-09-05 NOTE — Assessment & Plan Note (Signed)
Due for re-eval but previously well controlled  Metformin low dose. Encouraged exercise, weight loss, healthy eating habits.

## 2017-09-05 NOTE — Assessment & Plan Note (Signed)
Will follow with liver function tests. Pt working on low fat diet and weight loss.

## 2017-09-05 NOTE — Progress Notes (Signed)
Subjective:    Patient ID: Caitlin Gomez, female    DOB: 1981/03/02, 37 y.o.   MRN: [id number]  HPI  37 year old female presents to transfer care.  Previously seen by  Mable Paris.  Last CPX  2017.Marland Kitchen Plan repeat in 3 years.  Morbid obesity: Body mass index is 42.21 kg/m.  Diabetes:  Dx 5 years ago. Lab Results  Component Value Date   HGBA1C 6.7 (H) 03/04/2017  Using medications without difficulties: nonr Hypoglycemic episodes: Hyperglycemic episodes: Feet problems: no ulcer Blood Sugars averaging: not chekcing eye exam within last year: overdue.  No exercise  Moderate diet.. Trying to eat healthier, skips breakfast.   Hypertension: Good control on lisinopril  BP Readings from Last 3 Encounters:  09/05/17 138/86  08/08/17 122/80  08/02/17 130/80  Using medication without problems or lightheadedness:  none Chest pain with exertion:none Edema:none Short of breath: none Average home BPs: Other issues:  Hx of recurrent diverticulitis.   Bipolar disorder, hypomanicr: Hx of hospitalization 5 years ago. good control on lamictal, can use seroquel as needed. She current see Dr. Erin Hearing in [place].. As well as counselor in [place].  Allergies: seasonal summer and fall. Treated with clariritn.Marland Kitchen occ using clarinex for hive   Social History /Family History/Past Medical History reviewed in detail and updated in EMR if needed. Blood pressure 138/86, pulse 87, temperature 98.3 F (36.8 C), temperature source Oral, height 5\' 6"  (1.676 m), weight 261 lb 8 oz (118.6 kg), last menstrual period 09/02/2017.  Review of Systems  Constitutional: Negative for fatigue and fever.  HENT: Negative for congestion.   Eyes: Negative for pain.  Respiratory: Negative for cough and shortness of breath.   Cardiovascular: Negative for chest pain, palpitations and leg swelling.  Gastrointestinal: Negative for abdominal pain.  Genitourinary: Negative for dysuria and vaginal bleeding.    Musculoskeletal: Negative for back pain.  Neurological: Negative for syncope, light-headedness and headaches.  Psychiatric/Behavioral: Negative for dysphoric mood.       Objective:   Physical Exam  Constitutional: Vital signs are normal. She appears well-developed and well-nourished. She is cooperative.  Non-toxic appearance. She does not appear ill. No distress.  Morbidly obese  HENT:  Head: Normocephalic.  Right Ear: Hearing, tympanic membrane, external ear and ear canal normal. Tympanic membrane is not erythematous, not retracted and not bulging.  Left Ear: Hearing, tympanic membrane, external ear and ear canal normal. Tympanic membrane is not erythematous, not retracted and not bulging.  Nose: No mucosal edema or rhinorrhea. Right sinus exhibits no maxillary sinus tenderness and no frontal sinus tenderness. Left sinus exhibits no maxillary sinus tenderness and no frontal sinus tenderness.  Mouth/Throat: Uvula is midline, oropharynx is clear and moist and mucous membranes are normal.  Eyes: Pupils are equal, round, and reactive to light. Conjunctivae, EOM and lids are normal. Lids are everted and swept, no foreign bodies found.  Neck: Trachea normal and normal range of motion. Neck supple. Carotid bruit is not present. No thyroid mass and no thyromegaly present.  Cardiovascular: Normal rate, regular rhythm, S1 normal, S2 normal, normal heart sounds, intact distal pulses and normal pulses. Exam reveals no gallop and no friction rub.  No murmur heard. Pulmonary/Chest: Effort normal and breath sounds normal. No tachypnea. No respiratory distress. She has no decreased breath sounds. She has no wheezes. She has no rhonchi. She has no rales.  Abdominal: Soft. Normal appearance and bowel sounds are normal. There is no tenderness.  Neurological: She is alert.  Skin:  Skin is warm, dry and intact. No rash noted.  Psychiatric: Her speech is normal and behavior is normal. Judgment and thought  content normal. Her mood appears not anxious. Cognition and memory are normal. She does not exhibit a depressed mood.      Diabetic foot exam: Normal inspection No skin breakdown No calluses  Normal DP pulses Normal sensation to light touch and monofilament Nails normal     Assessment & Plan:

## 2017-09-05 NOTE — Assessment & Plan Note (Signed)
Well controlled. Continue current medication.  

## 2017-09-09 ENCOUNTER — Encounter: Payer: Self-pay | Admitting: *Deleted

## 2017-09-09 ENCOUNTER — Telehealth: Payer: Self-pay | Admitting: Family Medicine

## 2017-09-09 DIAGNOSIS — E119 Type 2 diabetes mellitus without complications: Secondary | ICD-10-CM

## 2017-09-09 DIAGNOSIS — I10 Essential (primary) hypertension: Secondary | ICD-10-CM

## 2017-09-09 MED ORDER — LISINOPRIL 10 MG PO TABS: 10.0000 mg | ORAL_TABLET | Freq: Every day | ORAL | 1 refills | Status: DC

## 2017-09-09 MED ORDER — METFORMIN HCL 500 MG PO TABS: 500.0000 mg | ORAL_TABLET | Freq: Two times a day (BID) | ORAL | 1 refills | Status: DC

## 2017-09-09 MED ORDER — GLIPIZIDE 10 MG PO TABS: ORAL_TABLET | ORAL | 1 refills | Status: DC

## 2017-09-09 NOTE — Telephone Encounter (Signed)
MyChart message also sent to patient letting her know I have been trying to reach her by phone and to please call back in regards to her lab results or send Dr. Beckey Rutter a MyChart message (since she has reviewed her results/comments on MyChart) to let us know if she is agreeable to increasing her Metformin, does she want a referral to a nutritionist and to reschedule her follow up for 3 months instead on 6 months.  (Message copied)  Patient called for her results- she has seen them- patient does not want to increase her Metformin- she wants to try to control her diet. She declines referral to nutritionist. She did schedule her 3 month follow up and labs to evaluate how she is doing and she states if she is high then- she will increase the Metformin.   Patient also ask that her medications: Glipizide, Metformin and Lisinopril be sent to [place] she wants 90 day supply.

## 2017-09-13 ENCOUNTER — Encounter: Payer: Self-pay | Admitting: Family Medicine

## 2017-09-13 ENCOUNTER — Ambulatory Visit: Payer: BLUE CROSS/BLUE SHIELD | Admitting: Family Medicine

## 2017-09-13 ENCOUNTER — Other Ambulatory Visit: Payer: Self-pay

## 2017-09-13 DIAGNOSIS — M549 Dorsalgia, unspecified: Secondary | ICD-10-CM

## 2017-09-13 MED ORDER — DICLOFENAC SODIUM 75 MG PO TBEC: 75.0000 mg | DELAYED_RELEASE_TABLET | Freq: Two times a day (BID) | ORAL | 0 refills | Status: DC

## 2017-09-13 MED ORDER — CYCLOBENZAPRINE HCL 10 MG PO TABS: 10.0000 mg | ORAL_TABLET | Freq: Every evening | ORAL | 0 refills | Status: DC | PRN

## 2017-09-13 NOTE — Assessment & Plan Note (Signed)
Likely MSK strain. No radiculopathy or red flags.  Treat with NSAIDS, muscle relaxant (high dose), heat and home PT.

## 2017-09-13 NOTE — Progress Notes (Signed)
Subjective:    Patient ID: Caitlin Gomez, female    DOB: 1981/01/06, 37 y.o.   MRN: [id number]  HPI  37 year old morbidly obese female pt  With DM, bipolar disorderpresents with new onset  upper back pain.   She reports new onset pain in  Last 2 days started after falling asleep.Marland Kitchen gradually worsening.  Pain at neck radiates to mid back. No radiation of pain to arms.  No weakness or numbness in arms. Pain increased with lying flat, turning neck lifting things.  No fever , no change urine flow.   She has been using ibuprofen 800 mg and old muscle relaxant ( cyclobenzaprine 5 mg).  Keeping her up at night.    HX of injury to back 2 years ago.. Daughter was playing with her by riding on her.   Blood pressure 120/78, pulse 99, temperature 98.3 F (36.8 C), temperature source Oral, height 5\' 6"  (1.676 m), weight 260 lb 12 oz (118.3 kg), last menstrual period 09/02/2017.   Review of Systems  Constitutional: Negative for fatigue and fever.  HENT: Negative for ear pain.   Eyes: Negative for pain.  Respiratory: Negative for chest tightness and shortness of breath.   Cardiovascular: Negative for chest pain, palpitations and leg swelling.  Gastrointestinal: Negative for abdominal pain.  Genitourinary: Negative for dysuria.       Objective:   Physical Exam  Constitutional: Vital signs are normal. She appears well-developed and well-nourished. She is cooperative.  Non-toxic appearance. She does not appear ill. No distress.  Morbidly obese  HENT:  Head: Normocephalic.  Right Ear: Hearing, tympanic membrane, external ear and ear canal normal. Tympanic membrane is not erythematous, not retracted and not bulging.  Left Ear: Hearing, tympanic membrane, external ear and ear canal normal. Tympanic membrane is not erythematous, not retracted and not bulging.  Nose: No mucosal edema or rhinorrhea. Right sinus exhibits no maxillary sinus tenderness and no frontal sinus tenderness. Left sinus  exhibits no maxillary sinus tenderness and no frontal sinus tenderness.  Mouth/Throat: Uvula is midline, oropharynx is clear and moist and mucous membranes are normal.  Eyes: Pupils are equal, round, and reactive to light. Conjunctivae, EOM and lids are normal. Lids are everted and swept, no foreign bodies found.  Neck: Trachea normal and normal range of motion. Neck supple. Carotid bruit is not present. No thyroid mass and no thyromegaly present.  Cardiovascular: Normal rate, regular rhythm, S1 normal, S2 normal, normal heart sounds, intact distal pulses and normal pulses. Exam reveals no gallop and no friction rub.  No murmur heard. Pulmonary/Chest: Effort normal and breath sounds normal. No tachypnea. No respiratory distress. She has no decreased breath sounds. She has no wheezes. She has no rhonchi. She has no rales.  Abdominal: Soft. Normal appearance and bowel sounds are normal. There is no tenderness.  Musculoskeletal:       Cervical back: She exhibits decreased range of motion and tenderness. She exhibits no bony tenderness and no swelling.  ttp mainly with forward neck flexion and rotation.  Neurological: She is alert. She has normal strength. No cranial nerve deficit or sensory deficit. She exhibits normal muscle tone. Coordination and gait normal.  Skin: Skin is warm, dry and intact. No rash noted.  Psychiatric: Her speech is normal and behavior is normal. Judgment and thought content normal. Her mood appears not anxious. Cognition and memory are normal. She does not exhibit a depressed mood.          Assessment &  Plan:

## 2017-09-13 NOTE — Patient Instructions (Signed)
Home gentle stretches.  Start heat and massage on back.  Start diclofenac with  A meal twice daily for pain and inflammation.  Stop aleve and ibuprofen.  Can use cyclobenzaprine higher dose at night  As needed for muscle spasm.

## 2017-10-15 ENCOUNTER — Encounter: Payer: Self-pay | Admitting: Family Medicine

## 2017-10-15 ENCOUNTER — Other Ambulatory Visit: Payer: Self-pay

## 2017-10-15 ENCOUNTER — Ambulatory Visit: Payer: BLUE CROSS/BLUE SHIELD | Admitting: Family Medicine

## 2017-10-15 VITALS — BP 120/80 | HR 76 | Temp 98.3°F | Ht 66.0 in | Wt 265.2 lb

## 2017-10-15 DIAGNOSIS — M79644 Pain in right finger(s): Secondary | ICD-10-CM

## 2017-10-15 DIAGNOSIS — S6991XA Unspecified injury of right wrist, hand and finger(s), initial encounter: Secondary | ICD-10-CM | POA: Diagnosis not present

## 2017-10-15 NOTE — Patient Instructions (Addendum)
Start diclofenac 75 mg twice daily x 1-2 weeks.  Ice finger. Limit activity in of right hand.  Wear splint to restrict bending.  Follow up in 2 weeks if not improving.

## 2017-10-15 NOTE — Assessment & Plan Note (Signed)
No known injury.. No clear indication for X-ray.  Treat as finger sprain with Ice, splint, elevation., NSAIDs.   Follow up and consider X-ray if not improving in 2 weeks.

## 2017-10-15 NOTE — Progress Notes (Signed)
Subjective:    Patient ID: Caitlin Gomez, female    DOB: 08/19/1980, 37 y.o.   MRN: [id number]  HPI  37 year old female with bipolar disorder, morbid obesity, DM presents with new onset pain in right middle finger x 1 month. Was swollen.  Pain occurred first when woke up in AM, resolved but has since come back back intermittently.  Pain with  finger extension over middle  MCP and PIP joint. If makes tight fist .. Pain occurs as well.  Radiation of pain occ to dorsal hand.  No current swelling, no redness.   She recalls no injury.  No change in activity.  She does help MOM with groceries frequently.    Ibuprofen helps temporarily.   She is right handed.  Social History /Family History/Past Medical History reviewed in detail and updated in EMR if needed. Blood pressure 120/80, pulse 76, temperature 98.3 F (36.8 C), temperature source Oral, height 5\' 6"  (1.676 m), weight 265 lb 4 oz (120.3 kg), last menstrual period 10/02/2017.   No hx of hand issues.   Review of Systems  Constitutional: Negative for fatigue and fever.  HENT: Negative for ear pain.   Eyes: Negative for pain.  Respiratory: Negative for chest tightness and shortness of breath.   Cardiovascular: Negative for chest pain, palpitations and leg swelling.  Gastrointestinal: Negative for abdominal pain.  Genitourinary: Negative for dysuria.       Objective:   Physical Exam  Constitutional: Vital signs are normal. She appears well-developed and well-nourished. She is cooperative.  Non-toxic appearance. She does not appear ill. No distress.  HENT:  Head: Normocephalic.  Right Ear: Hearing, tympanic membrane, external ear and ear canal normal. Tympanic membrane is not erythematous, not retracted and not bulging.  Left Ear: Hearing, tympanic membrane, external ear and ear canal normal. Tympanic membrane is not erythematous, not retracted and not bulging.  Nose: No mucosal edema or rhinorrhea. Right sinus exhibits no  maxillary sinus tenderness and no frontal sinus tenderness. Left sinus exhibits no maxillary sinus tenderness and no frontal sinus tenderness.  Mouth/Throat: Uvula is midline, oropharynx is clear and moist and mucous membranes are normal.  Eyes: Pupils are equal, round, and reactive to light. Conjunctivae, EOM and lids are normal. Lids are everted and swept, no foreign bodies found.  Neck: Trachea normal and normal range of motion. Neck supple. Carotid bruit is not present. No thyroid mass and no thyromegaly present.  Cardiovascular: Normal rate, regular rhythm, S1 normal, S2 normal, normal heart sounds, intact distal pulses and normal pulses. Exam reveals no gallop and no friction rub.  No murmur heard. Pulmonary/Chest: Effort normal and breath sounds normal. No tachypnea. No respiratory distress. She has no decreased breath sounds. She has no wheezes. She has no rhonchi. She has no rales.  Abdominal: Soft. Normal appearance and bowel sounds are normal. There is no tenderness.  Musculoskeletal:       Right hand: She exhibits decreased range of motion and tenderness.       Left hand: Normal.  ttp over PIP and MCP jpint , no redness and no swelling  pain with flexion and extension of finger  Neurological: She is alert.  Skin: Skin is warm, dry and intact. No rash noted.  Psychiatric: Her speech is normal and behavior is normal. Judgment and thought content normal. Her mood appears not anxious. Cognition and memory are normal. She does not exhibit a depressed mood.  Assessment & Plan:

## 2017-12-02 ENCOUNTER — Other Ambulatory Visit: Payer: Self-pay | Admitting: Family Medicine

## 2017-12-02 DIAGNOSIS — I10 Essential (primary) hypertension: Secondary | ICD-10-CM

## 2017-12-06 ENCOUNTER — Telehealth: Payer: Self-pay | Admitting: Family Medicine

## 2017-12-06 ENCOUNTER — Other Ambulatory Visit: Payer: BLUE CROSS/BLUE SHIELD

## 2017-12-06 DIAGNOSIS — E119 Type 2 diabetes mellitus without complications: Secondary | ICD-10-CM

## 2017-12-06 NOTE — Telephone Encounter (Signed)
-----   Message from Lendon Collar, RT sent at 11/25/2017 11:49 AM EDT ----- Regarding: Lab orders for Friday 12/06/17 Please enter lab orders for 54month follow-up. Patient also has upcoming medication appointment. Thanks-Lauren

## 2017-12-10 ENCOUNTER — Ambulatory Visit: Payer: BLUE CROSS/BLUE SHIELD | Admitting: Family Medicine

## 2017-12-20 ENCOUNTER — Other Ambulatory Visit (INDEPENDENT_AMBULATORY_CARE_PROVIDER_SITE_OTHER): Payer: BLUE CROSS/BLUE SHIELD

## 2017-12-20 DIAGNOSIS — E119 Type 2 diabetes mellitus without complications: Secondary | ICD-10-CM | POA: Diagnosis not present

## 2017-12-20 LAB — LIPID PANEL
CHOLESTEROL: 211 mg/dL — AB (ref 0–200)
HDL: 33.9 mg/dL — ABNORMAL LOW (ref >39.00)
Total CHOL/HDL Ratio: 6

## 2017-12-20 LAB — COMPREHENSIVE METABOLIC PANEL
ALBUMIN: 4.2 g/dL (ref 3.5–5.2)
ALT: 52 U/L — AB (ref 0–35)
AST: 35 U/L (ref 0–37)
Alkaline Phosphatase: 51 U/L (ref 39–117)
BILIRUBIN TOTAL: 0.3 mg/dL (ref 0.2–1.2)
BUN: 9 mg/dL (ref 6–23)
CALCIUM: 9 mg/dL (ref 8.4–10.5)
CO2: 25 mEq/L (ref 19–32)
Chloride: 102 mEq/L (ref 96–112)
Creatinine, Ser: 0.76 mg/dL (ref 0.40–1.20)
GFR: 91.87 mL/min (ref >60.00)
Glucose, Bld: 275 mg/dL — ABNORMAL HIGH (ref 70–99)
Potassium: 4.3 mEq/L (ref 3.5–5.1)
Sodium: 134 mEq/L — ABNORMAL LOW (ref 135–145)
TOTAL PROTEIN: 7.3 g/dL (ref 6.0–8.3)

## 2017-12-20 LAB — LDL CHOLESTEROL, DIRECT: Direct LDL: 118 mg/dL

## 2017-12-20 LAB — HEMOGLOBIN A1C: Hgb A1c MFr Bld: 8.6 % — ABNORMAL HIGH (ref 4.6–6.5)

## 2017-12-24 ENCOUNTER — Ambulatory Visit: Payer: BLUE CROSS/BLUE SHIELD | Admitting: Family Medicine

## 2017-12-24 ENCOUNTER — Encounter: Payer: Self-pay | Admitting: Family Medicine

## 2017-12-24 DIAGNOSIS — E119 Type 2 diabetes mellitus without complications: Secondary | ICD-10-CM

## 2017-12-24 DIAGNOSIS — K760 Fatty (change of) liver, not elsewhere classified: Secondary | ICD-10-CM | POA: Diagnosis not present

## 2017-12-24 DIAGNOSIS — B3731 Acute candidiasis of vulva and vagina: Secondary | ICD-10-CM

## 2017-12-24 DIAGNOSIS — I10 Essential (primary) hypertension: Secondary | ICD-10-CM | POA: Diagnosis not present

## 2017-12-24 DIAGNOSIS — B373 Candidiasis of vulva and vagina: Secondary | ICD-10-CM

## 2017-12-24 DIAGNOSIS — Z6841 Body Mass Index (BMI) 40.0 and over, adult: Secondary | ICD-10-CM

## 2017-12-24 MED ORDER — METFORMIN HCL 500 MG PO TABS: 1000.0000 mg | ORAL_TABLET | Freq: Two times a day (BID) | ORAL | 5 refills | Status: DC

## 2017-12-24 MED ORDER — ALBUTEROL SULFATE HFA 108 (90 BASE) MCG/ACT IN AERS: 2.0000 | INHALATION_SPRAY | Freq: Four times a day (QID) | RESPIRATORY_TRACT | 2 refills | Status: DC | PRN

## 2017-12-24 MED ORDER — FLUCONAZOLE 150 MG PO TABS: 150.0000 mg | ORAL_TABLET | Freq: Once | ORAL | 0 refills | Status: AC

## 2017-12-24 NOTE — Assessment & Plan Note (Signed)
Encouraged exercise, weight loss, healthy eating habits. ? ?

## 2017-12-24 NOTE — Assessment & Plan Note (Signed)
Increase metformin, increase activity and decrease carbs in diet.

## 2017-12-24 NOTE — Assessment & Plan Note (Signed)
Slightly increased ALT compared to last check.. likely due to poor diet lately.

## 2017-12-24 NOTE — Assessment & Plan Note (Signed)
Treat with fluconazole x 1

## 2017-12-24 NOTE — Progress Notes (Signed)
   Subjective:    Patient ID: Caitlin Gomez, female    DOB: 11-15-1980, 37 y.o.   MRN: [id number]  HPI    37 year old female presents for follow up DM  Diabetes:   Poor control on glipizide, metformin.. worsened from last check in April 2019  She has been having some issues with emotionally eating.. Trying to work.  Minimal exercise. Lab Results  Component Value Date   HGBA1C 8.6 (H) 12/20/2017  Using medications without difficulties: Hypoglycemic episodes: Hyperglycemic episodes: Feet problems:none Blood Sugars averaging: not checking. eye exam within last year:  Hypertension:   Poor control .Marland Kitchen On lisinopril  She has seen nml BPs at psychiatry 132/78   BP Readings from Last 3 Encounters:  12/24/17 (!) 164/98  10/15/17 120/80  09/13/17 120/78  Using medication without problems or lightheadedness: none Chest pain with exertion:none Edema:none Short of breath:none Average home BPs: Other issues:  She notes SOB, wheeze after being around parents that smoke.. Albuterol inhaler helps.. She needs refill.   Sh has some vaginal itching and white discharge.. Likely from high sugar. Review of Systems  Constitutional: Negative for fatigue and fever.  HENT: Negative for congestion.   Eyes: Negative for pain.  Respiratory: Negative for cough and shortness of breath.   Cardiovascular: Negative for chest pain, palpitations and leg swelling.  Gastrointestinal: Negative for abdominal pain.  Genitourinary: Positive for vaginal discharge. Negative for dysuria and vaginal bleeding.  Musculoskeletal: Negative for back pain.  Neurological: Negative for syncope, light-headedness and headaches.  Psychiatric/Behavioral: Negative for dysphoric mood.       Objective:   Physical Exam  Constitutional: Vital signs are normal. She appears well-developed and well-nourished. She is cooperative.  Non-toxic appearance. She does not appear ill. No distress.  obesity  HENT:  Head: Normocephalic.   Right Ear: Hearing, tympanic membrane, external ear and ear canal normal. Tympanic membrane is not erythematous, not retracted and not bulging.  Left Ear: Hearing, tympanic membrane, external ear and ear canal normal. Tympanic membrane is not erythematous, not retracted and not bulging.  Nose: No mucosal edema or rhinorrhea. Right sinus exhibits no maxillary sinus tenderness and no frontal sinus tenderness. Left sinus exhibits no maxillary sinus tenderness and no frontal sinus tenderness.  Mouth/Throat: Uvula is midline, oropharynx is clear and moist and mucous membranes are normal.  Eyes: Pupils are equal, round, and reactive to light. Conjunctivae, EOM and lids are normal. Lids are everted and swept, no foreign bodies found.  Neck: Trachea normal and normal range of motion. Neck supple. Carotid bruit is not present. No thyroid mass and no thyromegaly present.  Cardiovascular: Normal rate, regular rhythm, S1 normal, S2 normal, normal heart sounds, intact distal pulses and normal pulses. Exam reveals no gallop and no friction rub.  No murmur heard. Pulmonary/Chest: Effort normal and breath sounds normal. No tachypnea. No respiratory distress. She has no decreased breath sounds. She has no wheezes. She has no rhonchi. She has no rales.  Abdominal: Soft. Normal appearance and bowel sounds are normal. There is no tenderness.  Neurological: She is alert.  Skin: Skin is warm, dry and intact. No rash noted.  Psychiatric: Her speech is normal and behavior is normal. Judgment and thought content normal. Her mood appears not anxious. Cognition and memory are normal. She does not exhibit a depressed mood.          Assessment & Plan:

## 2017-12-24 NOTE — Assessment & Plan Note (Signed)
Follow BP at home.. Check  Daily.. Call or [place] with BP measurements after 1-2 weeks. Goal < 140/90.

## 2017-12-24 NOTE — Patient Instructions (Addendum)
Follow BP at home.. Check  Daily.. Call or [place] with BP measurements after 1-2 weeks. Goal < 140/90.  Exercise .Marland Kitchen Go for a walk... 2-3 times a week.  Increase metformin to 2 tabs in AM and 2 tabs in PM.. Call if any muscle ache SE as you had in past.

## 2017-12-31 ENCOUNTER — Encounter: Payer: Self-pay | Admitting: Family Medicine

## 2018-02-11 ENCOUNTER — Ambulatory Visit: Payer: Self-pay | Admitting: Family Medicine

## 2018-02-11 NOTE — Telephone Encounter (Signed)
I returned her call.   She is c/o having poison ivy from working in the yard on Sunday.   "I get this every year".   "It's hard to avoid because it grows all around my yard".   See triage notes. I went over the care advice with her since she had not used anything over the counter.    I instructed her to go to the urgent care or ED if the poison ivy on her cheek and nose continued to spread.   She verbalized understanding.  I made her an appt with Dr. Danise Mina for 02/12/18 at 2:00.   Dr. Diona Browner her PCP was booked.  Reason for Disposition . SEVERE itching (e.g., interferes with sleep or normal activities)  Answer Assessment - Initial Assessment Questions 1. APPEARANCE of RASH: "Describe the rash."     I was working in the yard Sunday.     Blisters and red lines/strecks.  Burning and itching.   Not put anything on it yet. 2. LOCATION: "Where is the rash located?"      Nose, cheek, back of neck, forearms,  backs of my legs.   The ones on my face came up a couple of hours ago. 3. SIZE: "How large is the rash?"      See above. 4. ONSET: "When did the rash begin?"      It started yesterday.   This morning it started getting worse. 5. ITCHING: "Does the rash itch?" If so, ask: "How bad is it?"   - MILD - doesn't interfere with normal activities   - MODERATE - SEVERE: interferes with work, school, sleep, or other activities      My arm was severe itching. 6. PREGNANCY: "Is there any chance you are pregnant?" "When was your last menstrual period?"     No  Protocols used: POISON IVY - O[place] - SUMAC-A-AH

## 2018-02-12 ENCOUNTER — Telehealth: Payer: Self-pay | Admitting: Family Medicine

## 2018-02-12 ENCOUNTER — Encounter: Payer: Self-pay | Admitting: Family Medicine

## 2018-02-12 ENCOUNTER — Ambulatory Visit: Payer: BLUE CROSS/BLUE SHIELD | Admitting: Family Medicine

## 2018-02-12 VITALS — BP 136/80 | HR 104 | Temp 98.4°F | Ht 66.0 in | Wt 264.0 lb

## 2018-02-12 DIAGNOSIS — L237 Allergic contact dermatitis due to plants, except food: Secondary | ICD-10-CM

## 2018-02-12 DIAGNOSIS — E119 Type 2 diabetes mellitus without complications: Secondary | ICD-10-CM

## 2018-02-12 MED ORDER — TRIAMCINOLONE ACETONIDE 0.1 % EX CREA: 1.0000 "application " | TOPICAL_CREAM | Freq: Two times a day (BID) | CUTANEOUS | 0 refills | Status: DC

## 2018-02-12 MED ORDER — PREDNISONE 20 MG PO TABS: ORAL_TABLET | ORAL | 0 refills | Status: DC

## 2018-02-12 NOTE — Telephone Encounter (Signed)
Please call - we started prednisone taper for poison ivy. This will raise her sugar levels high. Due to her diabetes, we have to be very careful with sugar levels - please try to limit all sources of carbs and added sugars in diet while she is on prednisone. Continue her metformin and glipizide, increase water intake for the next week. Let us know how sugars run.

## 2018-02-12 NOTE — Patient Instructions (Addendum)
Treat poison ivy with prednisone taper.  May continue calomine lotion, benadryl, oatmeal bath.  Poison Ivy Dermatitis Poison ivy dermatitis is inflammation of the skin that is caused by the allergens on the leaves of the poison ivy plant. The skin reaction often involves redness, swelling, blisters, and extreme itching. What are the causes? This condition is caused by a specific chemical (urushiol) found in the sap of the poison ivy plant. This chemical is sticky and can be easily spread to people, animals, and objects. You can get poison ivy dermatitis by:  Having direct contact with a poison ivy plant.  Touching animals, other people, or objects that have come in contact with poison ivy and have the chemical on them.  What increases the risk? This condition is more likely to develop in:  People who are outdoors often.  People who go outdoors without wearing protective clothing, such as closed shoes, long pants, and a long-sleeved shirt.  What are the signs or symptoms? Symptoms of this condition include:  Redness and itching.  A rash that often includes bumps and blisters. The rash usually appears 48 hours after exposure.  Swelling. This may occur if the reaction is more severe.  Symptoms usually last for 1-2 weeks. However, the first time you develop this condition, symptoms may last 3-4 weeks. How is this diagnosed? This condition may be diagnosed based on your symptoms and a physical exam. Your health care provider may also ask you about any recent outdoor activity. How is this treated? Treatment for this condition will vary depending on how severe it is. Treatment may include:  Hydrocortisone creams or calamine lotions to relieve itching.  Oatmeal baths to soothe the skin.  Over-the-counter antihistamine tablets.  Oral steroid medicine for more severe outbreaks.  Follow these instructions at home:  Take or apply over-the-counter and prescription medicines only as  told by your health care provider.  Wash exposed skin as soon as possible with soap and cold water.  Use hydrocortisone creams or calamine lotion as needed to soothe the skin and relieve itching.  Take oatmeal baths as needed. Use colloidal oatmeal. You can get this at your local pharmacy or grocery store. Follow the instructions on the packaging.  Do not scratch or rub your skin.  While you have the rash, wash clothes right after you wear them. How is this prevented?  Learn to identify the poison ivy plant and avoid contact with the plant. This plant can be recognized by the number of leaves. Generally, poison ivy has three leaves with flowering branches on a single stem. The leaves are typically glossy, and they have jagged edges that come to a point at the front.  If you have been exposed to poison ivy, thoroughly wash with soap and water right away. You have about 30 minutes to remove the plant resin before it will cause the rash. Be sure to wash under your fingernails because any plant resin there will continue to spread the rash.  When hiking or camping, wear clothes that will help you to avoid exposure on the skin. This includes long pants, a long-sleeved shirt, tall socks, and hiking boots. You can also apply preventive lotion to your skin to help limit exposure.  If you suspect that your clothes or outdoor gear came in contact with poison ivy, rinse them off outside with a garden hose before you bring them inside your house. Contact a health care provider if:  You have open sores in the rash area.  You have more redness, swelling, or pain in the affected area.  You have redness that spreads beyond the rash area.  You have fluid, blood, or pus coming from the affected area.  You have a fever.  You have a rash over a large area of your body.  You have a rash on your eyes, mouth, or genitals.  Your rash does not improve after a few days. Get help right away if:  Your face  swells or your eyes swell shut.  You have trouble breathing.  You have trouble swallowing. This information is not intended to replace advice given to you by your health care provider. Make sure you discuss any questions you have with your health care provider. Document Released: 05/18/2000 Document Revised: 10/27/2015 Document Reviewed: 10/27/2014 Elsevier Interactive Patient Education  [employer].

## 2018-02-12 NOTE — Progress Notes (Signed)
BP 136/80 (BP Location: Left Arm, Patient Position: Sitting, Cuff Size: Large)   Pulse (!) 104   Temp 98.4 F (36.9 C) (Oral)   Ht 5\' 6"  (1.676 m)   Wt 264 lb (119.7 kg)   SpO2 98%   BMI 42.61 kg/m    CC: poison ivy Subjective:    Patient ID: Caitlin Gomez, female    DOB: [birth year], 37 y.o.   MRN: [id number]  HPI: TELSA DILLAVOU is a 37 y.o. female presenting on 02/12/2018 for [place] exposure while working in the yard on Sunday - tends to get this yearly.  Face involving nose and near eyes, arms, legs, feet, hands, neck, ears. Growing in the mulch.  Treating with calomine lotion and benadryl - some temporary benefit.  No new lotions detergents, soaps or shampoos.  Relevant past medical, surgical, family and social history reviewed and updated as indicated. Interim medical history since our last visit reviewed. Allergies and medications reviewed and updated. Outpatient Medications Prior to Visit  Medication Sig Dispense Refill  . albuterol (PROVENTIL HFA;VENTOLIN HFA) 108 (90 Base) MCG/ACT inhaler Inhale 2 puffs into the lungs every 6 (six) hours as needed for wheezing or shortness of breath. 1 Inhaler 2  . glipiZIDE (GLUCOTROL) 10 MG tablet TAKE 1 TABLET BY MOUTH 2 TIMES A DAY BEFORE MEALS 180 tablet 1  . glucose blood test strip Contour Next Test Strips    . lamoTRIgine (LAMICTAL) 100 MG tablet Take 250 mg by mouth daily.     Marland Kitchen lisinopril (PRINIVIL,ZESTRIL) 10 MG tablet Take 1 tablet (10 mg total) by mouth daily. 90 tablet 1  . loratadine (CLARITIN) 10 MG tablet Take 10 mg by mouth daily.    . metFORMIN (GLUCOPHAGE) 500 MG tablet Take 2 tablets (1,000 mg total) by mouth 2 (two) times daily with a meal. 120 tablet 5  . cyclobenzaprine (FLEXERIL) 10 MG tablet Take 1 tablet (10 mg total) by mouth at bedtime as needed for muscle spasms. (Patient not taking: Reported on 12/24/2017) 15 tablet 0  . diclofenac (VOLTAREN) 75 MG EC tablet Take 1 tablet (75 mg total)  by mouth 2 (two) times daily. (Patient not taking: Reported on 12/24/2017) 30 tablet 0  . lamoTRIgine (LAMICTAL) 25 MG tablet   1   No facility-administered medications prior to visit.      Per HPI unless specifically indicated in ROS section below Review of Systems     Objective:    BP 136/80 (BP Location: Left Arm, Patient Position: Sitting, Cuff Size: Large)   Pulse (!) 104   Temp 98.4 F (36.9 C) (Oral)   Ht 5\' 6"  (1.676 m)   Wt 264 lb (119.7 kg)   SpO2 98%   BMI 42.61 kg/m   Wt Readings from Last 3 Encounters:  02/12/18 264 lb (119.7 kg)  12/24/17 265 lb (120.2 kg)  10/15/17 265 lb 4 oz (120.3 kg)    Physical Exam  Constitutional: She appears well-developed and well-nourished. No distress.  Musculoskeletal: She exhibits no edema.  Skin: Skin is warm and dry. Rash noted. There is erythema.  Diffuse pruritic erythematous papular rash throughout extremities, trunk, neck, R cheek, nose, and few vesicles near R eye, some in linear distribution   Nursing note and vitals reviewed.  Results for orders placed or performed in visit on 12/20/17  Comprehensive metabolic panel  Result Value Ref Range   Sodium 134 (L) 135 - 145 mEq/L   Potassium 4.3 3.5 -  5.1 mEq/L   Chloride 102 96 - 112 mEq/L   CO2 25 19 - 32 mEq/L   Glucose, Bld 275 (H) 70 - 99 mg/dL   BUN 9 6 - 23 mg/dL   Creatinine, Ser 0.76 0.40 - 1.20 mg/dL   Total Bilirubin 0.3 0.2 - 1.2 mg/dL   Alkaline Phosphatase 51 39 - 117 U/L   AST 35 0 - 37 U/L   ALT 52 (H) 0 - 35 U/L   Total Protein 7.3 6.0 - 8.3 g/dL   Albumin 4.2 3.5 - 5.2 g/dL   Calcium 9.0 8.4 - 10.5 mg/dL   GFR 91.87 >60.00 mL/min  Lipid panel  Result Value Ref Range   Cholesterol 211 (H) 0 - 200 mg/dL   Triglycerides (H) 0.0 - 149.0 mg/dL    443.0 Triglyceride is over 400; calculations on Lipids are invalid.   HDL 33.90 (L) >39.00 mg/dL   Total CHOL/HDL Ratio 6   Hemoglobin A1c  Result Value Ref Range   Hgb A1c MFr Bld 8.6 (H) 4.6 - 6.5 %  LDL  cholesterol, direct  Result Value Ref Range   Direct LDL 118.0 mg/dL      Assessment & Plan:   Problem List Items Addressed This Visit    Type 2 diabetes mellitus without complication, without long-term current use of insulin (HCC)    Chronic, uncontrolled. Will caution about steroid use in DM hx - limiting carbs and sugars while on prednisone, closely monitor sugars in interim.       Poison ivy dermatitis - Primary    Diffuse throughout body including face. She states she has tolerated prednisone well despite DM hx. Rx prednisone taper, TCI cream, continue benadryl and calomine lotion. Update with effect.           Meds ordered this encounter  Medications  . predniSONE (DELTASONE) 20 MG tablet    Sig: Take two tablets daily for 4 days followed by one tablet daily for 4 days    Dispense:  12 tablet    Refill:  0  . triamcinolone cream (KENALOG) 0.1 %    Sig: Apply 1 application topically 2 (two) times daily. Apply to AA.    Dispense:  30 g    Refill:  0   No orders of the defined types were placed in this encounter.   Follow up plan: Return if symptoms worsen or fail to improve.  Ria Bush, MD

## 2018-02-12 NOTE — Assessment & Plan Note (Signed)
Chronic, uncontrolled. Will caution about steroid use in DM hx - limiting carbs and sugars while on prednisone, closely monitor sugars in interim.

## 2018-02-12 NOTE — Assessment & Plan Note (Signed)
Diffuse throughout body including face. She states she has tolerated prednisone well despite DM hx. Rx prednisone taper, TCI cream, continue benadryl and calomine lotion. Update with effect.

## 2018-02-12 NOTE — Telephone Encounter (Signed)
Left message on vm per dpr relaying Dr. G's message and instructions.  

## 2018-02-14 NOTE — Telephone Encounter (Signed)
Spoke with pt confirming she received vm with Dr. Synthia Innocent message.  States she did and expresses her thanks.

## 2018-02-20 ENCOUNTER — Encounter: Payer: Self-pay | Admitting: Family Medicine

## 2018-02-20 MED ORDER — FLUCONAZOLE 150 MG PO TABS: 150.0000 mg | ORAL_TABLET | Freq: Once | ORAL | 0 refills | Status: AC

## 2018-02-27 ENCOUNTER — Other Ambulatory Visit: Payer: Self-pay | Admitting: Family Medicine

## 2018-02-27 DIAGNOSIS — I10 Essential (primary) hypertension: Secondary | ICD-10-CM

## 2018-03-04 ENCOUNTER — Other Ambulatory Visit: Payer: BLUE CROSS/BLUE SHIELD

## 2018-03-07 ENCOUNTER — Ambulatory Visit: Payer: BLUE CROSS/BLUE SHIELD | Admitting: Family Medicine

## 2018-03-15 ENCOUNTER — Telehealth: Payer: Self-pay | Admitting: Family Medicine

## 2018-03-15 DIAGNOSIS — E119 Type 2 diabetes mellitus without complications: Secondary | ICD-10-CM

## 2018-03-15 NOTE — Telephone Encounter (Signed)
-----   Message from Lendon Collar, RT sent at 03/10/2018 10:14 AM EDT ----- Regarding: Lab orders for Friday 03/21/18 Please enter 1month follow up labs for 03/21/18. Thanks!

## 2018-03-21 ENCOUNTER — Other Ambulatory Visit (INDEPENDENT_AMBULATORY_CARE_PROVIDER_SITE_OTHER): Payer: BLUE CROSS/BLUE SHIELD

## 2018-03-21 DIAGNOSIS — E119 Type 2 diabetes mellitus without complications: Secondary | ICD-10-CM | POA: Diagnosis not present

## 2018-03-21 LAB — LIPID PANEL
CHOLESTEROL: 191 mg/dL (ref 0–200)
HDL: 32.6 mg/dL — AB (ref >39.00)
NonHDL: 158.27
Total CHOL/HDL Ratio: 6
Triglycerides: 204 mg/dL — ABNORMAL HIGH (ref 0.0–149.0)
VLDL: 40.8 mg/dL — ABNORMAL HIGH (ref 0.0–40.0)

## 2018-03-21 LAB — COMPREHENSIVE METABOLIC PANEL
ALK PHOS: 46 U/L (ref 39–117)
ALT: 45 U/L — ABNORMAL HIGH (ref 0–35)
AST: 38 U/L — ABNORMAL HIGH (ref 0–37)
Albumin: 4.2 g/dL (ref 3.5–5.2)
BUN: 11 mg/dL (ref 6–23)
CALCIUM: 9.3 mg/dL (ref 8.4–10.5)
CO2: 24 mEq/L (ref 19–32)
Chloride: 103 mEq/L (ref 96–112)
Creatinine, Ser: 0.79 mg/dL (ref 0.40–1.20)
GFR: 87.73 mL/min (ref >60.00)
Glucose, Bld: 188 mg/dL — ABNORMAL HIGH (ref 70–99)
POTASSIUM: 3.9 meq/L (ref 3.5–5.1)
Sodium: 137 mEq/L (ref 135–145)
TOTAL PROTEIN: 7.1 g/dL (ref 6.0–8.3)
Total Bilirubin: 0.5 mg/dL (ref 0.2–1.2)

## 2018-03-21 LAB — LDL CHOLESTEROL, DIRECT: LDL DIRECT: 140 mg/dL

## 2018-03-21 LAB — HEMOGLOBIN A1C: HEMOGLOBIN A1C: 8.3 % — AB (ref 4.6–6.5)

## 2018-03-28 ENCOUNTER — Encounter: Payer: Self-pay | Admitting: Family Medicine

## 2018-03-28 ENCOUNTER — Ambulatory Visit: Payer: BLUE CROSS/BLUE SHIELD | Admitting: Family Medicine

## 2018-03-28 VITALS — BP 140/86 | HR 85 | Temp 98.5°F | Ht 66.0 in | Wt 260.5 lb

## 2018-03-28 DIAGNOSIS — I10 Essential (primary) hypertension: Secondary | ICD-10-CM | POA: Diagnosis not present

## 2018-03-28 DIAGNOSIS — E119 Type 2 diabetes mellitus without complications: Secondary | ICD-10-CM | POA: Diagnosis not present

## 2018-03-28 DIAGNOSIS — Z6841 Body Mass Index (BMI) 40.0 and over, adult: Secondary | ICD-10-CM

## 2018-03-28 DIAGNOSIS — E7800 Pure hypercholesterolemia, unspecified: Secondary | ICD-10-CM | POA: Insufficient documentation

## 2018-03-28 LAB — HM DIABETES FOOT EXAM

## 2018-03-28 NOTE — Progress Notes (Signed)
Subjective:    Patient ID: Caitlin Gomez, female    DOB: 07-11-1980, 37 y.o.   MRN: [id number]  HPI  37 year old female presents for 3 month follow up.  Diabetes:   Inadequate control on glipizide and metformin but slightly better on increased dose of metformin to max. No SE on higher dose.  She did have prednisone in last several months. Lab Results  Component Value Date   HGBA1C 8.3 (H) 03/21/2018  Using medications without difficulties: Hypoglycemic episodes:none Hyperglycemic episodes:yes but not since exercising Feet problems: no ulcers on feet Blood Sugars averaging: FBS 174 eye exam within last year: Has appt in 06/2017   She joined gym and she is going 3 days a week . IN 3 weeks.  5 lbs weight loss.  her mood is much better and she has a lot more energy! Wt Readings from Last 3 Encounters:  03/28/18 260 lb 8 oz (118.2 kg)  02/12/18 264 lb (119.7 kg)  12/24/17 265 lb (120.2 kg)     High cholesterol  LDLnot at goal < 100 Lab Results  Component Value Date   CHOL 191 03/21/2018   HDL 32.60 (L) 03/21/2018   LDLCALC 114 (H) 03/05/2016   LDLDIRECT 140.0 03/21/2018   TRIG 204.0 (H) 03/21/2018   CHOLHDL 6 03/21/2018    Hypertension:    Borderline control on lisinopril. BP Readings from Last 3 Encounters:  03/28/18 140/86  02/12/18 136/80  12/24/17 (!) 164/98  Using medication without problems or lightheadedness:  none Chest pain with exertion:none Edema:none Short of breath:none Average home BPs: not checking. Other issues:   Review of Systems  Constitutional: Negative for fatigue and fever.  HENT: Negative for congestion.   Eyes: Negative for pain.  Respiratory: Negative for cough and shortness of breath.   Cardiovascular: Negative for chest pain, palpitations and leg swelling.  Gastrointestinal: Negative for abdominal pain.  Genitourinary: Negative for dysuria and vaginal bleeding.  Musculoskeletal: Negative for back pain.  Neurological: Negative  for syncope, light-headedness and headaches.  Psychiatric/Behavioral: Negative for dysphoric mood.       Objective:   Physical Exam  Constitutional: Vital signs are normal. She appears well-developed and well-nourished. She is cooperative.  Non-toxic appearance. She does not appear ill. No distress.  obesity  HENT:  Head: Normocephalic.  Right Ear: Hearing, tympanic membrane, external ear and ear canal normal. Tympanic membrane is not erythematous, not retracted and not bulging.  Left Ear: Hearing, tympanic membrane, external ear and ear canal normal. Tympanic membrane is not erythematous, not retracted and not bulging.  Nose: No mucosal edema or rhinorrhea. Right sinus exhibits no maxillary sinus tenderness and no frontal sinus tenderness. Left sinus exhibits no maxillary sinus tenderness and no frontal sinus tenderness.  Mouth/Throat: Uvula is midline, oropharynx is clear and moist and mucous membranes are normal.  Eyes: Pupils are equal, round, and reactive to light. Conjunctivae, EOM and lids are normal. Lids are everted and swept, no foreign bodies found.  Neck: Trachea normal and normal range of motion. Neck supple. Carotid bruit is not present. No thyroid mass and no thyromegaly present.  Cardiovascular: Normal rate, regular rhythm, S1 normal, S2 normal, normal heart sounds, intact distal pulses and normal pulses. Exam reveals no gallop and no friction rub.  No murmur heard. Pulmonary/Chest: Effort normal and breath sounds normal. No tachypnea. No respiratory distress. She has no decreased breath sounds. She has no wheezes. She has no rhonchi. She has no rales.  Abdominal: Soft. Normal  appearance and bowel sounds are normal. There is no tenderness.  Neurological: She is alert.  Skin: Skin is warm, dry and intact. No rash noted.  Psychiatric: Her speech is normal and behavior is normal. Judgment and thought content normal. Her mood appears not anxious. Cognition and memory are normal.  She does not exhibit a depressed mood.    Diabetic foot exam: Normal inspection No skin breakdown No calluses  Normal DP pulses Normal sensation to light touch and monofilament Nails normal       Assessment & Plan:

## 2018-03-28 NOTE — Assessment & Plan Note (Signed)
Improved but borderline controll on ACEI. Follow with ongoing weight loss.

## 2018-03-28 NOTE — Assessment & Plan Note (Signed)
LDL not at goal... Start statin next OV if not at goal.

## 2018-03-28 NOTE — Assessment & Plan Note (Signed)
Encouraged exercise, weight loss, healthy eating habits. ? ?

## 2018-03-28 NOTE — Assessment & Plan Note (Signed)
She is energized about exercising and weihg tloss. She wants to work aggressively with this for DM control, continue metformin and glipizide at max. Follow up 3 months.  Refer to nutritionist.

## 2018-03-28 NOTE — Patient Instructions (Addendum)
Continue great work on diet changes and increased exercise.  Call if blood sugars not continuing to decrease as expected.  Follow BP at home.. Goal < 140/90.. Call if persistently above.  Please stop at the front desk to set up referral.

## 2018-04-22 ENCOUNTER — Encounter: Payer: Self-pay | Admitting: Family Medicine

## 2018-04-22 ENCOUNTER — Ambulatory Visit: Payer: BLUE CROSS/BLUE SHIELD | Admitting: Family Medicine

## 2018-04-22 DIAGNOSIS — R1032 Left lower quadrant pain: Secondary | ICD-10-CM | POA: Diagnosis not present

## 2018-04-22 LAB — POC URINALSYSI DIPSTICK (AUTOMATED)
BILIRUBIN UA: NEGATIVE
GLUCOSE UA: NEGATIVE
KETONES UA: NEGATIVE
Leukocytes, UA: NEGATIVE
Nitrite, UA: NEGATIVE
Protein, UA: NEGATIVE
RBC UA: NEGATIVE
UROBILINOGEN UA: 0.2 U/dL
pH, UA: 6 (ref 5.0–8.0)

## 2018-04-22 LAB — POCT URINE PREGNANCY: Preg Test, Ur: NEGATIVE

## 2018-04-22 NOTE — Progress Notes (Signed)
   Subjective:    Patient ID: Caitlin Gomez, female    DOB: March 21, 1981, 37 y.o.   MRN: [id number]  Abdominal Pain  This is a new problem. The current episode started in the past 7 days ( 3 days). The onset quality is sudden. The problem has been gradually improving ( Pain was moderate initially, but now almost gone.). The pain is located in the LLQ. The pain is at a severity of 4/10. The pain is moderate. The quality of the pain is aching. The abdominal pain radiates to the periumbilical region. Pertinent negatives include no belching, constipation, flatus, hematuria or myalgias. Associated symptoms comments:  Low grade temp at night. . Nothing aggravates the pain. The pain is relieved by nothing. She has tried nothing for the symptoms. The treatment provided no relief. Her past medical history is significant for GERD. There is no history of Crohn's disease, irritable bowel syndrome, pancreatitis, PUD or ulcerative colitis.      Blood pressure 130/80, pulse 91, temperature 98.6 F (37 C), temperature source Oral, height 5\' 6"  (1.676 m), weight 258 lb 8 oz (117.3 kg). Social History /Family History/Past Medical History reviewed in detail and updated in EMR if needed.  Review of Systems  Gastrointestinal: Negative for constipation and flatus.  Genitourinary: Negative for hematuria.  Musculoskeletal: Negative for myalgias.       Objective:   Physical Exam  Constitutional: Vital signs are normal. She appears well-developed and well-nourished. She is cooperative.  Non-toxic appearance. She does not appear ill. No distress.  HENT:  Head: Normocephalic.  Right Ear: Hearing, tympanic membrane, external ear and ear canal normal. Tympanic membrane is not erythematous, not retracted and not bulging.  Left Ear: Hearing, tympanic membrane, external ear and ear canal normal. Tympanic membrane is not erythematous, not retracted and not bulging.  Nose: No mucosal edema or rhinorrhea. Right sinus  exhibits no maxillary sinus tenderness and no frontal sinus tenderness. Left sinus exhibits no maxillary sinus tenderness and no frontal sinus tenderness.  Mouth/Throat: Uvula is midline, oropharynx is clear and moist and mucous membranes are normal.  Eyes: Pupils are equal, round, and reactive to light. Conjunctivae, EOM and lids are normal. Lids are everted and swept, no foreign bodies found.  Neck: Trachea normal and normal range of motion. Neck supple. Carotid bruit is not present. No thyroid mass and no thyromegaly present.  Cardiovascular: Normal rate, regular rhythm, S1 normal, S2 normal, normal heart sounds, intact distal pulses and normal pulses. Exam reveals no gallop and no friction rub.  No murmur heard. Pulmonary/Chest: Effort normal and breath sounds normal. No tachypnea. No respiratory distress. She has no decreased breath sounds. She has no wheezes. She has no rhonchi. She has no rales.  Abdominal: Soft. Normal appearance and bowel sounds are normal. There is no tenderness.  Neurological: She is alert.  Skin: Skin is warm, dry and intact. No rash noted.  Psychiatric: Her speech is normal and behavior is normal. Judgment and thought content normal. Her mood appears not anxious. Cognition and memory are normal. She does not exhibit a depressed mood.          Assessment & Plan:

## 2018-04-22 NOTE — Assessment & Plan Note (Signed)
Pain now gone. Neg UA and Upreg.  Possibly viral GE in light of chills, low grade temp and diarrhea.  Rest, fluids.  If pain recurs... She will call.

## 2018-04-22 NOTE — Addendum Note (Signed)
Addended by: Carter Kitten on: 04/22/2018 10:46 AM   Modules accepted: Orders

## 2018-04-22 NOTE — Patient Instructions (Addendum)
Rest, fluids. Likely viral gastroenteritis.  Call if abdominal pain returns before end of week for consideration of diverticulitis.

## 2018-05-13 ENCOUNTER — Encounter: Payer: Self-pay | Admitting: Family Medicine

## 2018-05-13 ENCOUNTER — Ambulatory Visit (INDEPENDENT_AMBULATORY_CARE_PROVIDER_SITE_OTHER)
Admission: RE | Admit: 2018-05-13 | Discharge: 2018-05-13 | Disposition: A | Discharge status: Home / Self Care | Payer: BLUE CROSS/BLUE SHIELD | Source: Ambulatory Visit | Attending: Family Medicine | Admitting: Family Medicine

## 2018-05-13 ENCOUNTER — Ambulatory Visit: Payer: BLUE CROSS/BLUE SHIELD | Admitting: Family Medicine

## 2018-05-13 VITALS — BP 164/80 | HR 95 | Temp 98.3°F | Ht 66.0 in | Wt 264.5 lb

## 2018-05-13 DIAGNOSIS — M79672 Pain in left foot: Secondary | ICD-10-CM
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
## 2018-05-13 NOTE — Patient Instructions (Signed)
Stop ibuprofen .Marland Kitchen Start diclofenac twice daily.  Elevated foot, continue icing.  Limit weight bearing.  We will call with X-ray results.

## 2018-05-14 DIAGNOSIS — M79672 Pain in left foot: Principal | ICD-10-CM

## 2018-05-14 NOTE — Assessment & Plan Note (Signed)
Given pain with weight bearing... eval with X-ray.  Most likely foot sprain.  Treat with NSAIDs, ICe, stretches and elevation.

## 2018-05-18 IMAGING — CT CT ABD-PELV W/ CM
2 of 4 series · 16 of 46 positions shown, 18 images · IV contrast (APPLIED)
Comparison: 02/11/2010 CT of the abdomen and pelvis.

CLINICAL DATA: 35 y/o F; lower abdominal pain with diarrhea for 1
week. History of diverticulitis.

EXAM:
CT ABDOMEN AND PELVIS WITH CONTRAST
TECHNIQUE: Multidetector CT imaging of the abdomen and pelvis was performed
using the standard protocol following bolus administration of
intravenous contrast.
CONTRAST:  100mL M5YXPY-H99 IOPAMIDOL (M5YXPY-H99) INJECTION 61%

[Series 2: axial st · axial · 0.87mm/px · z∈[-1002,-507]mm · 13 of 109 slices shown, 15 images]
[im 5/109  soft-tissue]
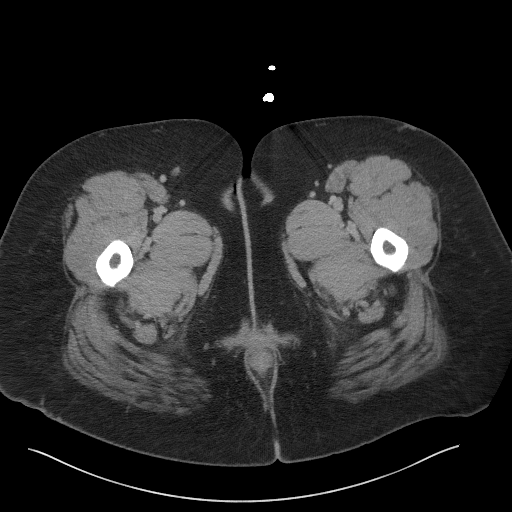
[im 5/109  bone]
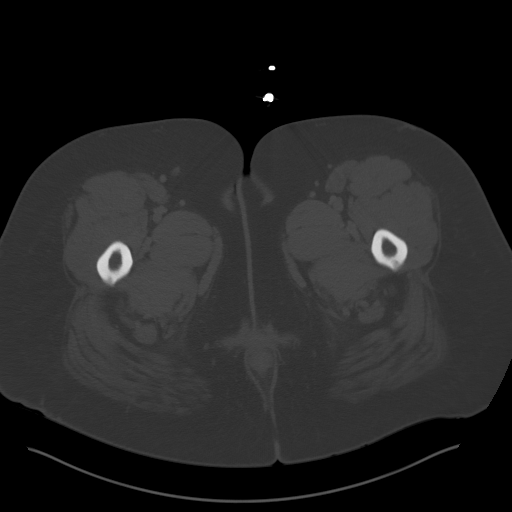
[im 14/109  soft-tissue]
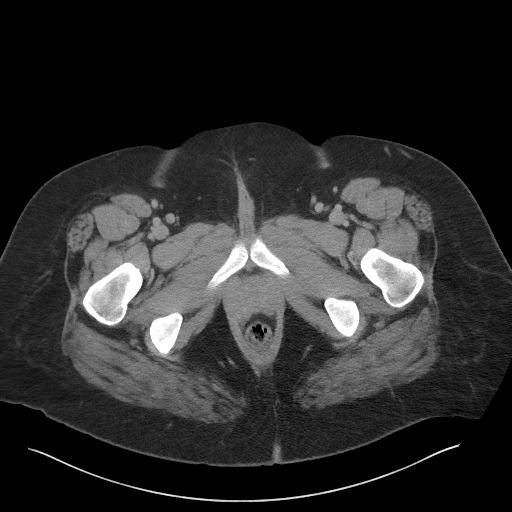
[im 23/109  soft-tissue]
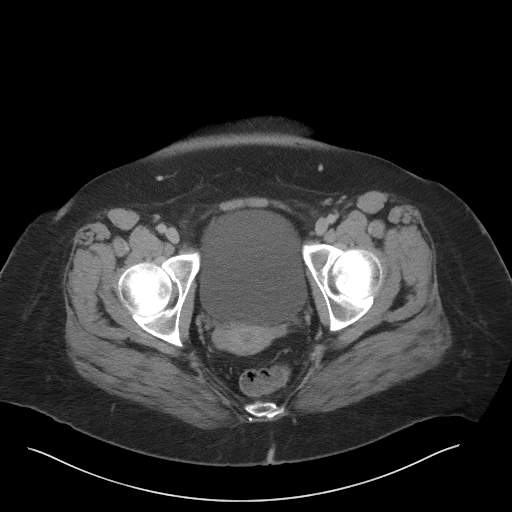
[im 32/109  soft-tissue]
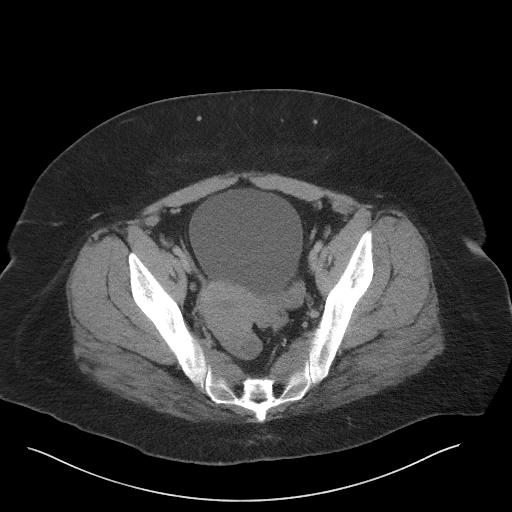
[im 37/109  soft-tissue]
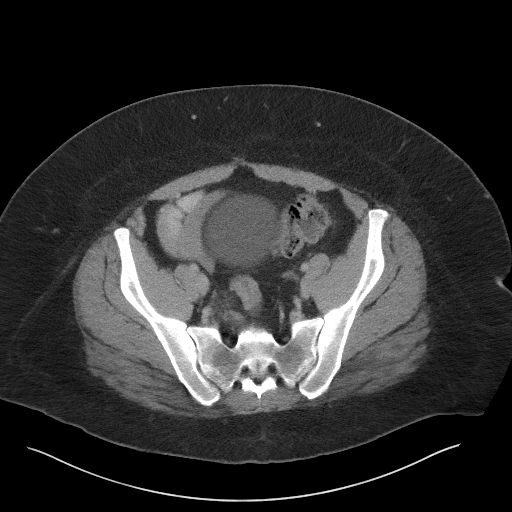
[im 46/109  soft-tissue]
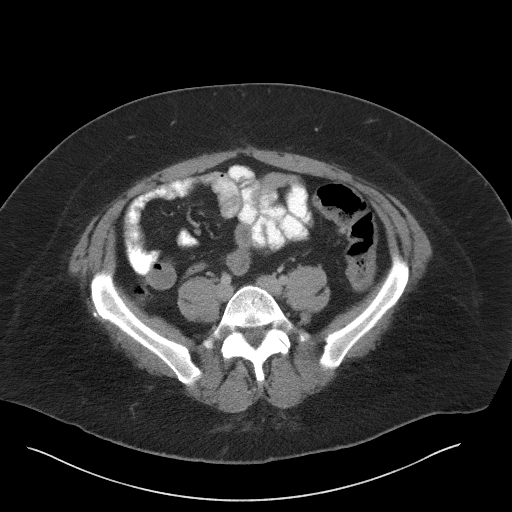
[im 55/109  soft-tissue]
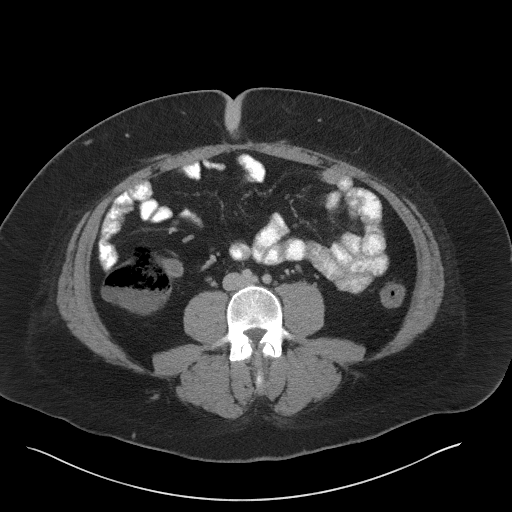
[im 64/109  soft-tissue]
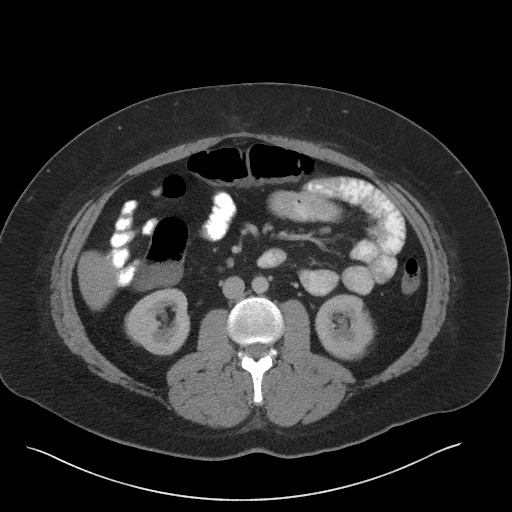
[im 73/109  soft-tissue]
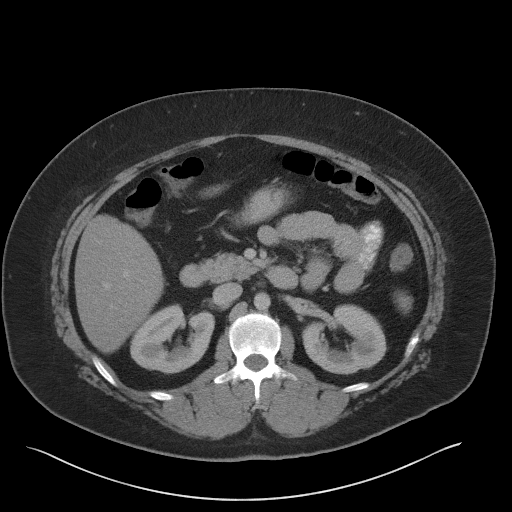
[im 73/109  bone]
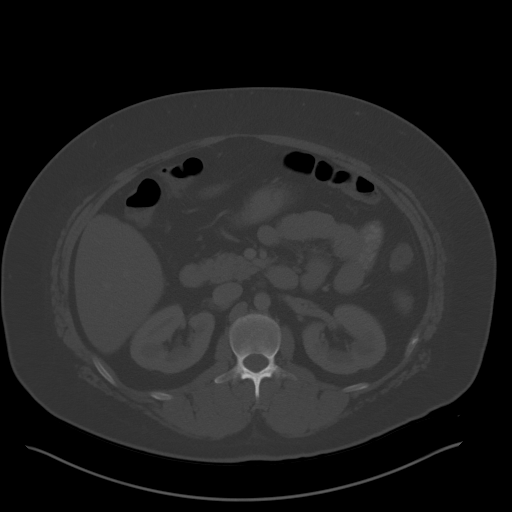
[im 77/109  soft-tissue]
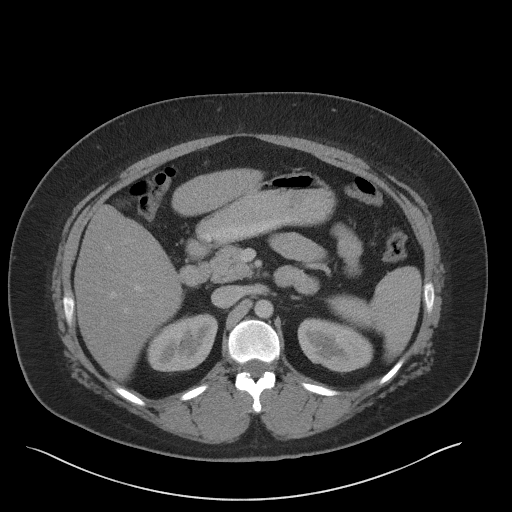
[im 86/109  soft-tissue]
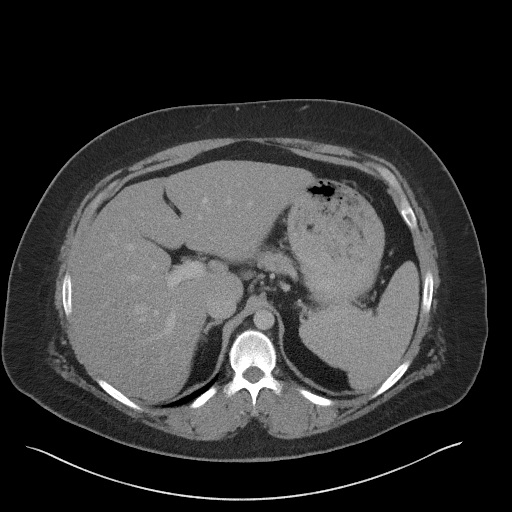
[im 95/109  soft-tissue]
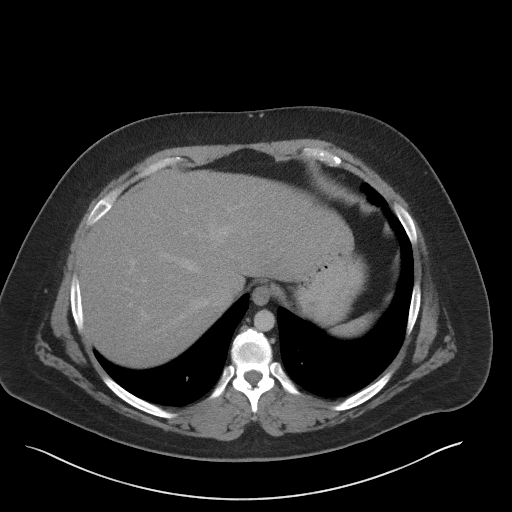
[im 104/109  soft-tissue]
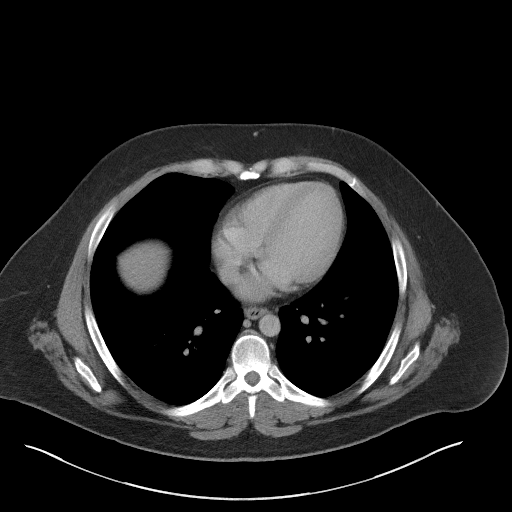

[Series 5: coronal st · coronal · 0.85mm/px · 3 of 107 slices shown]
[im 36/107  soft-tissue]
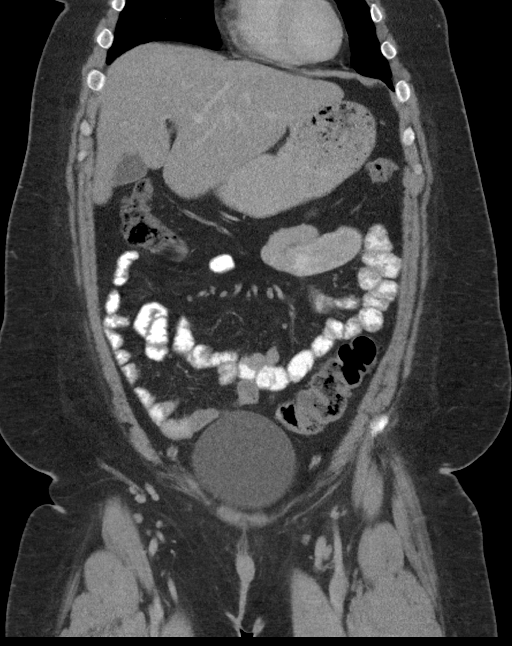
[im 48/107  soft-tissue]
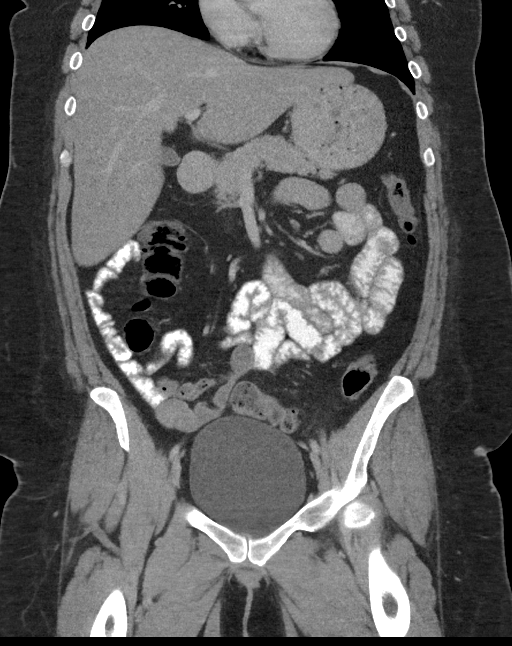
[im 59/107  soft-tissue]
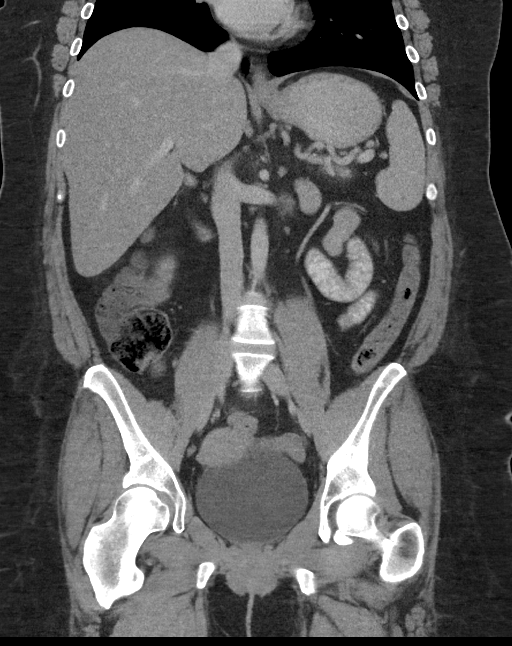

[16 of 46 positions shown; findings below may reference images not displayed]

FINDINGS: Lower chest: Stable 3 mm nodule in the left lower lobe (series 4,
image 6).

Hepatobiliary: Hepatic steatosis. No focal liver lesion. Normal
gallbladder. No intra or extrahepatic biliary ductal dilatation.

Pancreas: Unremarkable. No pancreatic ductal dilatation or
surrounding inflammatory changes.

Spleen: Normal in size without focal abnormality.

Adrenals/Urinary Tract: Adrenal glands are unremarkable. Kidneys are
normal, without renal calculi, focal lesion, or hydronephrosis.
Bladder is unremarkable.

Stomach/Bowel: Stomach is within normal limits. Appendix appears
normal. No evidence of bowel wall thickening, distention, or
inflammatory changes. Scattered sigmoid diverticulosis.

Vascular/Lymphatic: No significant vascular findings are present. No
enlarged abdominal or pelvic lymph nodes.

Reproductive: Uterus and bilateral adnexa are unremarkable.

Other: Midline ventral moderate-sized supraumbilical hernia with 5
mm neck containing fat.

Musculoskeletal: No acute or significant osseous findings.
IMPRESSION: 1. No acute process identified as explanation for abdominal pain.
2. Scattered diverticulosis, no evidence for acute diverticulitis.
3. Hepatic steatosis.
4. Moderate sized midline ventral supraumbilical hernia containing
fat.

By: Ortega Tony M.D.

## 2018-05-26 ENCOUNTER — Other Ambulatory Visit: Payer: Self-pay | Admitting: Family Medicine

## 2018-05-26 DIAGNOSIS — E119 Type 2 diabetes mellitus without complications: Secondary | ICD-10-CM

## 2018-06-20 ENCOUNTER — Other Ambulatory Visit: Payer: BLUE CROSS/BLUE SHIELD

## 2018-06-26 ENCOUNTER — Other Ambulatory Visit: Payer: Self-pay | Admitting: Family Medicine

## 2018-06-26 DIAGNOSIS — E119 Type 2 diabetes mellitus without complications: Secondary | ICD-10-CM

## 2018-06-27 ENCOUNTER — Ambulatory Visit: Payer: BLUE CROSS/BLUE SHIELD | Admitting: Family Medicine

## 2018-06-30 ENCOUNTER — Telehealth: Payer: Self-pay | Admitting: Family Medicine

## 2018-06-30 NOTE — Telephone Encounter (Signed)
Spoke with pt she stated she is waiting to get insurance through her spouse.  She stated it would be march.  I offered to schedule appointment out in march pt declined.  She stated she would call back once she has insurance.   Please schedule Caitlin Gomez an appointment with Dr. Diona Browner to follow up on diabetes with fasting labs prior.   Thanks,  Butch Penny

## 2018-07-01 DIAGNOSIS — E119 Type 2 diabetes mellitus without complications: Secondary | ICD-10-CM

## 2018-07-01 DIAGNOSIS — I10 Essential (primary) hypertension: Secondary | ICD-10-CM

## 2018-07-01 MED ORDER — LISINOPRIL 10 MG PO TABS: 10.0000 mg | ORAL_TABLET | Freq: Every day | ORAL | 0 refills | Status: DC

## 2018-07-01 MED ORDER — METFORMIN HCL 500 MG PO TABS: ORAL_TABLET | ORAL | 0 refills | Status: DC

## 2018-07-01 MED ORDER — GLIPIZIDE 10 MG PO TABS: ORAL_TABLET | ORAL | 0 refills | Status: DC

## 2018-08-25 ENCOUNTER — Other Ambulatory Visit: Payer: Self-pay | Admitting: Family Medicine

## 2018-08-25 DIAGNOSIS — E119 Type 2 diabetes mellitus without complications: Secondary | ICD-10-CM

## 2018-08-25 DIAGNOSIS — I10 Essential (primary) hypertension: Secondary | ICD-10-CM

## 2018-08-25 MED ORDER — LISINOPRIL 10 MG PO TABS: 10.0000 mg | ORAL_TABLET | Freq: Every day | ORAL | 0 refills | Status: DC

## 2018-08-25 MED ORDER — GLIPIZIDE 10 MG PO TABS: ORAL_TABLET | ORAL | 0 refills | Status: DC

## 2018-09-08 ENCOUNTER — Telehealth: Payer: Self-pay | Admitting: Family Medicine

## 2018-09-08 DIAGNOSIS — E119 Type 2 diabetes mellitus without complications: Secondary | ICD-10-CM

## 2018-09-08 NOTE — Telephone Encounter (Signed)
-----   Message from Cloyd Stagers, RT sent at 09/03/2018  2:49 PM EDT ----- Regarding: Labs orders for Wednesday 4.8.20 Lab orders please for 48mo f/u, office visit on Tuesday 4.14.20. Thanks,  Dyke Maes RT(R)

## 2018-09-10 ENCOUNTER — Other Ambulatory Visit: Payer: BLUE CROSS/BLUE SHIELD

## 2018-09-16 ENCOUNTER — Ambulatory Visit: Payer: BLUE CROSS/BLUE SHIELD | Admitting: Family Medicine

## 2018-09-29 MED ORDER — FLUCONAZOLE 150 MG PO TABS: 150.0000 mg | ORAL_TABLET | Freq: Once | ORAL | 0 refills | Status: AC

## 2018-09-29 NOTE — Telephone Encounter (Signed)
Diflucan 150 mg, 1 po x 1, repeat if needed in 1 week, #2, 0 ref

## 2018-12-06 DIAGNOSIS — E119 Type 2 diabetes mellitus without complications: Secondary | ICD-10-CM

## 2018-12-08 ENCOUNTER — Telehealth: Payer: Self-pay | Admitting: Family Medicine

## 2018-12-08 DIAGNOSIS — E7800 Pure hypercholesterolemia, unspecified: Secondary | ICD-10-CM

## 2018-12-08 DIAGNOSIS — E119 Type 2 diabetes mellitus without complications: Secondary | ICD-10-CM

## 2018-12-08 MED ORDER — METFORMIN HCL 500 MG PO TABS: ORAL_TABLET | ORAL | 0 refills | Status: DC

## 2018-12-08 MED ORDER — GLIPIZIDE 10 MG PO TABS: ORAL_TABLET | ORAL | 0 refills | Status: DC

## 2018-12-08 NOTE — Telephone Encounter (Signed)
-----   Message from Ellamae Sia sent at 12/08/2018 10:14 AM EDT ----- Regarding: Lab orders for Tuesday, 7.7.20 Patient is scheduled for CPX labs, please order future labs, Thanks , Karna Christmas

## 2018-12-09 ENCOUNTER — Other Ambulatory Visit: Payer: Self-pay

## 2018-12-09 ENCOUNTER — Other Ambulatory Visit (INDEPENDENT_AMBULATORY_CARE_PROVIDER_SITE_OTHER): Payer: BLUE CROSS/BLUE SHIELD

## 2018-12-09 DIAGNOSIS — E7800 Pure hypercholesterolemia, unspecified: Secondary | ICD-10-CM | POA: Diagnosis not present

## 2018-12-09 DIAGNOSIS — E119 Type 2 diabetes mellitus without complications: Secondary | ICD-10-CM

## 2018-12-09 LAB — LDL CHOLESTEROL, DIRECT: Direct LDL: 129 mg/dL

## 2018-12-09 LAB — HEMOGLOBIN A1C: Hgb A1c MFr Bld: 8.4 % — ABNORMAL HIGH (ref 4.6–6.5)

## 2018-12-09 LAB — COMPREHENSIVE METABOLIC PANEL
ALT: 35 U/L (ref 0–35)
AST: 23 U/L (ref 0–37)
Albumin: 4 g/dL (ref 3.5–5.2)
Alkaline Phosphatase: 48 U/L (ref 39–117)
BUN: 7 mg/dL (ref 6–23)
CO2: 26 mEq/L (ref 19–32)
Calcium: 8.8 mg/dL (ref 8.4–10.5)
Chloride: 102 mEq/L (ref 96–112)
Creatinine, Ser: 0.64 mg/dL (ref 0.40–1.20)
GFR: 104.82 mL/min (ref >60.00)
Glucose, Bld: 171 mg/dL — ABNORMAL HIGH (ref 70–99)
Potassium: 4.2 mEq/L (ref 3.5–5.1)
Sodium: 136 mEq/L (ref 135–145)
Total Bilirubin: 0.4 mg/dL (ref 0.2–1.2)
Total Protein: 6.6 g/dL (ref 6.0–8.3)

## 2018-12-09 LAB — LIPID PANEL
Cholesterol: 200 mg/dL (ref 0–200)
HDL: 39.4 mg/dL (ref >39.00)
NonHDL: 160.46
Total CHOL/HDL Ratio: 5
Triglycerides: 214 mg/dL — ABNORMAL HIGH (ref 0.0–149.0)
VLDL: 42.8 mg/dL — ABNORMAL HIGH (ref 0.0–40.0)

## 2018-12-16 ENCOUNTER — Encounter: Payer: Self-pay | Admitting: Family Medicine

## 2018-12-16 ENCOUNTER — Other Ambulatory Visit: Payer: Self-pay

## 2018-12-16 ENCOUNTER — Ambulatory Visit: Payer: BLUE CROSS/BLUE SHIELD | Admitting: Family Medicine

## 2018-12-16 DIAGNOSIS — E7800 Pure hypercholesterolemia, unspecified: Secondary | ICD-10-CM | POA: Diagnosis not present

## 2018-12-16 DIAGNOSIS — K760 Fatty (change of) liver, not elsewhere classified: Secondary | ICD-10-CM | POA: Diagnosis not present

## 2018-12-16 DIAGNOSIS — I10 Essential (primary) hypertension: Secondary | ICD-10-CM | POA: Diagnosis not present

## 2018-12-16 DIAGNOSIS — E119 Type 2 diabetes mellitus without complications: Secondary | ICD-10-CM

## 2018-12-16 MED ORDER — INSULIN PEN NEEDLE 29G X 12.7MM MISC: 11 refills | Status: DC

## 2018-12-16 MED ORDER — GLUCOSE BLOOD VI STRP: ORAL_STRIP | 11 refills | Status: DC

## 2018-12-16 MED ORDER — METFORMIN HCL 500 MG PO TABS: ORAL_TABLET | ORAL | 3 refills | Status: DC

## 2018-12-16 MED ORDER — LISINOPRIL 10 MG PO TABS: 10.0000 mg | ORAL_TABLET | Freq: Every day | ORAL | 3 refills | Status: DC

## 2018-12-16 MED ORDER — TRULICITY 0.75 MG/0.5ML ~~LOC~~ SOAJ: 0.7500 mg | SUBCUTANEOUS | 3 refills | Status: DC

## 2018-12-16 MED ORDER — GLIPIZIDE 10 MG PO TABS: ORAL_TABLET | ORAL | 3 refills | Status: DC

## 2018-12-16 NOTE — Patient Instructions (Addendum)
Add trulicity once weekly injection to regimen you are currently taking.  Call if tolerating well.  Check blood sugar daily fasting.Marland Kitchen goal < 120. Consider logging meals..myfitness pal. Check BP at home or pharmacy... goal BP < 140/90  Set up yearly eye exam.

## 2018-12-16 NOTE — Progress Notes (Signed)
Chief Complaint  Patient presents with  . Follow-up    3 mo f/u/ no complaints/ med refill on all meds    History of Present Illness: HPI   38 year old female presents for 3 months follow up.  Hypertension: Inadequate control in office despite lisinopril. .. she is anxious BP Readings from Last 3 Encounters:  12/16/18 (!) 156/90  05/13/18 (!) 164/80  04/22/18 130/80  Using medication without problems or lightheadedness:  none Chest pain with exertion:none Edema:none Short of breath:none Average home BPs: Other issues:  Diabetes:   No improvement in numbers in last 3 months. On glipizide and max metformin.  For a few weeks she messed up and di not take the metformin as instructed.Has restarted soda.  Wt Readings from Last 3 Encounters:  12/16/18 261 lb 8 oz (118.6 kg)  05/13/18 264 lb 8 oz (120 kg)  04/22/18 258 lb 8 oz (117.3 kg)  Body mass index is 42.21 kg/m.  Lab Results  Component Value Date   HGBA1C 8.4 (H) 12/09/2018  Using medications without difficulties: Hypoglycemic episodes: Hyperglycemic episodes: Feet problems: no ulcers. Blood Sugars averaging: not chekcing eye exam within last year: overdue   Elevated Cholesterol:  LDL improved but not at goal. Lab Results  Component Value Date   CHOL 200 12/09/2018   HDL 39.40 12/09/2018   LDLCALC 114 (H) 03/05/2016   LDLDIRECT 129.0 12/09/2018   TRIG 214.0 (H) 12/09/2018   CHOLHDL 5 12/09/2018  Using medications without problems: Muscle aches:  Diet compliance: moderate Exercise: none.Marland Kitchen COVID19.   Made mood worse.. no exercise..  seroquel helped with worsening in depression.. doing much better. Uses as needed. Other complaints:      COVID 19 screen No recent travel or known exposure to [place] The patient denies respiratory symptoms of COVID 19 at this time.  The importance of social distancing was discussed today.   ROS    Past Medical History:  Diagnosis Date  . Chicken pox   . Depression   .  Diabetes mellitus without complication ([place])   . Diverticulitis    12/2015  . Eating disorder   . Genital warts   . GERD (gastroesophageal reflux disease)   . Hypertension     reports that she has quit smoking. Her smoking use included cigarettes. She has never used smokeless tobacco. She reports current alcohol use. She reports that she does not use drugs.   Current Outpatient Medications:  .  albuterol (PROVENTIL HFA;VENTOLIN HFA) 108 (90 Base) MCG/ACT inhaler, Inhale 2 puffs into the lungs every 6 (six) hours as needed for wheezing or shortness of breath., Disp: 1 Inhaler, Rfl: 2 .  Azelastine HCl 0.15 % SOLN, , Disp: , Rfl: 11 .  Cholecalciferol (VITAMIN D PO), Take 1 tablet by mouth daily., Disp: , Rfl:  .  Cyanocobalamin (VITAMIN B-12 PO), Take 1 tablet by mouth daily., Disp: , Rfl:  .  glipiZIDE (GLUCOTROL) 10 MG tablet, TAKE 1 TABLET BY MOUTH TWICE (2) DAILY BEFORE MEALS, Disp: 60 tablet, Rfl: 0 .  glucose blood test strip, Contour Next Test Strips, Disp: , Rfl:  .  lamoTRIgine (LAMICTAL) 100 MG tablet, Take 250 mg by mouth daily. , Disp: , Rfl:  .  lisinopril (PRINIVIL,ZESTRIL) 10 MG tablet, Take 1 tablet (10 mg total) by mouth daily., Disp: 90 tablet, Rfl: 0 .  loratadine (CLARITIN) 10 MG tablet, Take 10 mg by mouth daily., Disp: , Rfl:  .  metFORMIN (GLUCOPHAGE) 500 MG tablet, TAKE 2  TABLETS BY MOUTH TWICE A DAY WITHA MEAL., Disp: 120 tablet, Rfl: 0 .  QUEtiapine (SEROQUEL) 50 MG tablet, Take 50 mg by mouth at bedtime., Disp: , Rfl:    Observations/Objective: Blood pressure (!) 156/90, pulse 86, temperature 98.4 F (36.9 C), temperature source Temporal, height 5\' 6"  (1.676 m), weight 261 lb 8 oz (118.6 kg), SpO2 99 %.  Physical Exam Constitutional:      General: She is not in acute distress.    Appearance: Normal appearance. She is well-developed. She is obese. She is not ill-appearing or toxic-appearing.  HENT:     Head: Normocephalic.     Right Ear: Hearing, tympanic  membrane, ear canal and external ear normal. Tympanic membrane is not erythematous, retracted or bulging.     Left Ear: Hearing, tympanic membrane, ear canal and external ear normal. Tympanic membrane is not erythematous, retracted or bulging.     Nose: No mucosal edema or rhinorrhea.     Right Sinus: No maxillary sinus tenderness or frontal sinus tenderness.     Left Sinus: No maxillary sinus tenderness or frontal sinus tenderness.     Mouth/Throat:     Pharynx: Uvula midline.  Eyes:     General: Lids are normal. Lids are everted, no foreign bodies appreciated.     Conjunctiva/sclera: Conjunctivae normal.     Pupils: Pupils are equal, round, and reactive to light.  Neck:     Musculoskeletal: Normal range of motion and neck supple.     Thyroid: No thyroid mass or thyromegaly.     Vascular: No carotid bruit.     Trachea: Trachea normal.  Cardiovascular:     Rate and Rhythm: Normal rate and regular rhythm.     Pulses: Normal pulses.     Heart sounds: Normal heart sounds, S1 normal and S2 normal. No murmur. No friction rub. No gallop.   Pulmonary:     Effort: Pulmonary effort is normal. No tachypnea or respiratory distress.     Breath sounds: Normal breath sounds. No decreased breath sounds, wheezing, rhonchi or rales.  Abdominal:     General: Bowel sounds are normal.     Palpations: Abdomen is soft.     Tenderness: There is no abdominal tenderness.  Skin:    General: Skin is warm and dry.     Findings: No rash.  Neurological:     Mental Status: She is alert.  Psychiatric:        Mood and Affect: Mood is not anxious or depressed.        Speech: Speech normal.        Behavior: Behavior normal. Behavior is cooperative.        Thought Content: Thought content normal.        Judgment: Judgment normal.      Diabetic foot exam: Normal inspection No skin breakdown No calluses  Normal DP pulses Normal sensation to light touch and monofilament Nails normal  Assessment and  Plan   Essential hypertension Unclear if anxiety provoked BP increase.  Follow at home.. she will call if > 140/90.  Fatty liver IMproved with decreased eating out and less fat in diet.  Type 2 diabetes mellitus without complication, without long-term current use of insulin (HCC) Poor control. Add trulicity weekly Encouraged exercise, weight loss, healthy eating habits.   Hypercholesterolemia Inadequate control but improving. Statin indicated... will hold off until DM control improved. Reviewed lifestyle changes.     Eliezer Lofts, MD

## 2018-12-16 NOTE — Assessment & Plan Note (Signed)
Unclear if anxiety provoked BP increase.  Follow at home.. she will call if > 140/90.

## 2018-12-16 NOTE — Assessment & Plan Note (Signed)
Inadequate control but improving. Statin indicated... will hold off until DM control improved. Reviewed lifestyle changes.

## 2018-12-16 NOTE — Assessment & Plan Note (Signed)
IMproved with decreased eating out and less fat in diet.

## 2018-12-16 NOTE — Assessment & Plan Note (Signed)
Poor control. Add trulicity weekly Encouraged exercise, weight loss, healthy eating habits.

## 2018-12-18 ENCOUNTER — Other Ambulatory Visit: Payer: Self-pay

## 2018-12-18 ENCOUNTER — Ambulatory Visit: Payer: BLUE CROSS/BLUE SHIELD | Admitting: Internal Medicine

## 2018-12-18 ENCOUNTER — Telehealth: Payer: Self-pay

## 2018-12-18 ENCOUNTER — Encounter: Payer: Self-pay | Admitting: Internal Medicine

## 2018-12-18 VITALS — BP 148/98 | HR 95 | Temp 97.5°F | Resp 16 | Ht 66.0 in | Wt 260.0 lb

## 2018-12-18 DIAGNOSIS — R1084 Generalized abdominal pain: Secondary | ICD-10-CM | POA: Diagnosis not present

## 2018-12-18 LAB — COMPREHENSIVE METABOLIC PANEL
ALT: 53 U/L — ABNORMAL HIGH (ref 0–35)
AST: 41 U/L — ABNORMAL HIGH (ref 0–37)
Albumin: 4.5 g/dL (ref 3.5–5.2)
Alkaline Phosphatase: 58 U/L (ref 39–117)
BUN: 8 mg/dL (ref 6–23)
CO2: 27 mEq/L (ref 19–32)
Calcium: 10.1 mg/dL (ref 8.4–10.5)
Chloride: 102 mEq/L (ref 96–112)
Creatinine, Ser: 0.65 mg/dL (ref 0.40–1.20)
GFR: 102.95 mL/min (ref >60.00)
Glucose, Bld: 112 mg/dL — ABNORMAL HIGH (ref 70–99)
Potassium: 4.7 mEq/L (ref 3.5–5.1)
Sodium: 138 mEq/L (ref 135–145)
Total Bilirubin: 0.3 mg/dL (ref 0.2–1.2)
Total Protein: 7.5 g/dL (ref 6.0–8.3)

## 2018-12-18 LAB — POCT URINALYSIS DIP (CLINITEK)
Bilirubin, UA: NEGATIVE
Glucose, UA: NEGATIVE mg/dL
Ketones, POC UA: NEGATIVE mg/dL
Leukocytes, UA: NEGATIVE
Nitrite, UA: NEGATIVE
POC PROTEIN,UA: NEGATIVE
Spec Grav, UA: 1.02 (ref 1.010–1.025)
Urobilinogen, UA: NEGATIVE E.U./dL — AB
pH, UA: 5 (ref 5.0–8.0)

## 2018-12-18 LAB — LIPASE: Lipase: 33 U/L (ref 11.0–59.0)

## 2018-12-18 LAB — CBC
HCT: 41.1 % (ref 36.0–46.0)
Hemoglobin: 13.7 g/dL (ref 12.0–15.0)
MCHC: 33.3 g/dL (ref 30.0–36.0)
MCV: 82.7 fl (ref 78.0–100.0)
Platelets: 355 10*3/uL (ref 150.0–400.0)
RBC: 4.96 Mil/uL (ref 3.87–5.11)
RDW: 13.7 % (ref 11.5–15.5)
WBC: 9.4 10*3/uL (ref 4.0–10.5)

## 2018-12-18 LAB — POCT URINE PREGNANCY: Preg Test, Ur: NEGATIVE

## 2018-12-18 LAB — AMYLASE: Amylase: 21 U/L — ABNORMAL LOW (ref 27–131)

## 2018-12-18 MED ORDER — CIPROFLOXACIN HCL 500 MG PO TABS: 500.0000 mg | ORAL_TABLET | Freq: Two times a day (BID) | ORAL | 0 refills | Status: DC

## 2018-12-18 MED ORDER — METRONIDAZOLE 500 MG PO TABS: 500.0000 mg | ORAL_TABLET | Freq: Three times a day (TID) | ORAL | 0 refills | Status: DC

## 2018-12-18 NOTE — Progress Notes (Signed)
Subjective:    Patient ID: Caitlin Gomez, female    DOB: Jul 04, 1980, 38 y.o.   MRN: [id number]  HPI  Patient presents to the clinic today for complaints of upper and lower abdominal pain which started this morning.  She describes the pain as a burning sensation and muscle spasms and rates it at a 7-9 this morning.  Her symptoms are relieved by laying still.  The pain is improving but has now developed tenderness/soreness across the right lower abdomen which radiates to her right lower back.  She denies fever, constipation, nausea, vomiting, diarrhea, heartburn, belching, or urinary symptoms.  She says she is on her period now, that it started late and is more of a spotting brown color.  She endorses eating spicy foods and drinking alcohol last night.   She was seen 2 days ago for DM follow up and was started on Dulaglutide injections which is known to cause GI upset. However, she has not started taking this medication yet.  She has a history of diverticulitis but she says the pain doesn't feel like that kind of pain.  Review of Systems      Past Medical History:  Diagnosis Date  . Chicken pox   . Depression   . Diabetes mellitus without complication ([place])   . Diverticulitis    12/2015  . Eating disorder   . Genital warts   . GERD (gastroesophageal reflux disease)   . Hypertension     Current Outpatient Medications  Medication Sig Dispense Refill  . albuterol (PROVENTIL HFA;VENTOLIN HFA) 108 (90 Base) MCG/ACT inhaler Inhale 2 puffs into the lungs every 6 (six) hours as needed for wheezing or shortness of breath. 1 Inhaler 2  . Azelastine HCl 0.15 % SOLN   11  . Cholecalciferol (VITAMIN D PO) Take 1 tablet by mouth daily.    . Cyanocobalamin (VITAMIN B-12 PO) Take 1 tablet by mouth daily.    . Dulaglutide (TRULICITY) 1.88 CZ/6.6AY SOPN Inject 0.75 mg into the skin once a week. 3 mL 3  . glipiZIDE (GLUCOTROL) 10 MG tablet TAKE 1 TABLET BY MOUTH TWICE (2) DAILY BEFORE MEALS 180  tablet 3  . glucose blood test strip Contour Next Test Strips 100 each 11  . Insulin Pen Needle 29G X 30.1SW MISC Inject trulicity weekly 109 each 11  . lamoTRIgine (LAMICTAL) 100 MG tablet Take 250 mg by mouth daily.     Marland Kitchen lisinopril (ZESTRIL) 10 MG tablet Take 1 tablet (10 mg total) by mouth daily. 90 tablet 3  . loratadine (CLARITIN) 10 MG tablet Take 10 mg by mouth daily.    . metFORMIN (GLUCOPHAGE) 500 MG tablet TAKE 2 TABLETS BY MOUTH TWICE A DAY WITHA MEAL. 360 tablet 3  . QUEtiapine (SEROQUEL) 50 MG tablet Take 50 mg by mouth at bedtime.     No current facility-administered medications for this visit.     Allergies  Allergen Reactions  . Amoxicillin Other (See Comments)    Chest pain   . Clindamycin Swelling  . Kale     Tongue swelling  . Oysters [Shellfish Allergy]     Dizzy, nauseated  . Ziprasidone Hcl Swelling  . Amoxicillin-Pot Clavulanate Rash    Family History  Problem Relation Age of Onset  . Alcohol abuse Mother   . Hyperlipidemia Mother   . Hypertension Mother   . Mental illness Mother   . Diabetes Mother   . Diabetes Father   . Mental illness Paternal Aunt   .  Cancer Paternal Aunt   . Early death Maternal Grandmother   . Mental illness Maternal Grandmother   . Diabetes Maternal Grandmother   . Diabetes Maternal Grandfather   . Stroke Paternal Grandfather   . Diabetes Paternal Grandfather     Social History   Socioeconomic History  . Marital status: Married    Spouse name: Quita Skye  . Number of children: 1  . Years of education: Not on file  . Highest education level: Some college, no degree  Occupational History  . Occupation: Probation officer, Sales executive  Social Needs  . Financial resource strain: Not on file  . Food insecurity    Worry: Not on file    Inability: Not on file  . Transportation needs    Medical: Not on file    Non-medical: Not on file  Tobacco Use  . Smoking status: Former Smoker    Types: Cigarettes  . Smokeless tobacco: Never Used   Substance and Sexual Activity  . Alcohol use: Yes    Comment: one glass of wine once every 3 months  . Drug use: No  . Sexual activity: Not on file  Lifestyle  . Physical activity    Days per week: Not on file    Minutes per session: Not on file  . Stress: Not on file  Relationships  . Social Herbalist on phone: Not on file    Gets together: Not on file    Attends religious service: Not on file    Active member of club or organization: Not on file    Attends meetings of clubs or organizations: Not on file    Relationship status: Not on file  . Intimate partner violence    Fear of current or ex partner: Not on file    Emotionally abused: Not on file    Physically abused: Not on file    Forced sexual activity: Not on file  Other Topics Concern  . Not on file  Social History Narrative   Born in [place] and has  Lived here since 38 years old.       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       Reads a lot.       Married.      65 year old daughter.         Constitutional: Denies fever, malaise, fatigue, headache or abrupt weight changes.  HEENT: Denies eye pain, eye redness, ear pain, ringing in the ears, wax buildup, runny nose, nasal congestion, bloody nose, or sore throat. Respiratory: Denies difficulty breathing, shortness of breath, cough or sputum production.   Cardiovascular: Denies chest pain, chest tightness, palpitations or swelling in the hands or feet.  Gastrointestinal: Complains of right upper quadrant abdominal pain which radiates to her back.  Denies bloating, constipation, diarrhea or blood in the stool.  GU: Currently menstruating now- spotting brown.  Denies urgency, frequency, pain with urination, burning sensation, blood in urine, odor or discharge.  No other specific complaints in a complete review of systems (except as listed in HPI above).  Objective:   Physical Exam  BP (!) 148/98   Pulse 95   Temp (!) 97.5 F (36.4 C) (Temporal)   Resp 16    Ht 5\' 6"  (1.676 m)   Wt 260 lb (117.9 kg)   SpO2 98%   BMI 41.97 kg/m    Wt Readings from Last 3 Encounters:  12/16/18 261 lb 8 oz (118.6 kg)  05/13/18 264 lb 8  oz (120 kg)  04/22/18 258 lb 8 oz (117.3 kg)    General: Appears her stated age, obese. Cardiovascular: Normal rate and rhythm. S1,S2 noted.  No murmur, rubs or gallops noted. No JVD or BLE edema. Pulmonary/Chest: Normal effort and positive vesicular breath sounds. No respiratory distress. No wheezes, rales or ronchi noted.  Abdomen: Soft and slightly tender upon palpation of RUQ and periumbilical area. Normal bowel sounds. No distention or masses noted. Liver, spleen and kidneys non palpable.  BMET    Component Value Date/Time   NA 136 12/09/2018 1129   NA 138 11/21/2012 1356   K 4.2 12/09/2018 1129   K 3.5 11/21/2012 1356   CL 102 12/09/2018 1129   CL 105 11/21/2012 1356   CO2 26 12/09/2018 1129   CO2 23 11/21/2012 1356   GLUCOSE 171 (H) 12/09/2018 1129   GLUCOSE 188 (H) 11/21/2012 1356   BUN 7 12/09/2018 1129   BUN 9 11/21/2012 1356   CREATININE 0.64 12/09/2018 1129   CREATININE 0.75 10/23/2016 1255   CALCIUM 8.8 12/09/2018 1129   CALCIUM 9.1 11/21/2012 1356   GFRNONAA >60 11/21/2012 1356   GFRAA >60 11/21/2012 1356    Lipid Panel     Component Value Date/Time   CHOL 200 12/09/2018 1129   TRIG 214.0 (H) 12/09/2018 1129   HDL 39.40 12/09/2018 1129   CHOLHDL 5 12/09/2018 1129   VLDL 42.8 (H) 12/09/2018 1129   LDLCALC 114 (H) 03/05/2016 0831    CBC    Component Value Date/Time   WBC 6.5 03/04/2017 0953   RBC 4.38 03/04/2017 0953   HGB 12.5 03/04/2017 0953   HGB 14.5 11/21/2012 1356   HCT 37.7 03/04/2017 0953   HCT 40.9 11/21/2012 1356   PLT 275.0 03/04/2017 0953   PLT 284 11/21/2012 1356   MCV 86.1 03/04/2017 0953   MCV 84 11/21/2012 1356   MCH 29.7 11/21/2012 1356   MCHC 33.3 03/04/2017 0953   RDW 13.8 03/04/2017 0953   RDW 12.9 11/21/2012 1356   LYMPHSABS 2.0 03/04/2017 0953   MONOABS  0.7 03/04/2017 0953   EOSABS 0.3 03/04/2017 0953   BASOSABS 0.1 03/04/2017 0953    Hgb A1C Lab Results  Component Value Date   HGBA1C 8.4 (H) 12/09/2018           Assessment & Plan:   Diffuse Abdominal Pain:  ?Diverticulitis flare vs Gas vs Constipation CBC, CMP, Amylase, Lipase checked today. Urinalysis: 3+ blood Urine preg: negative RX for Cipro 500 mg PO BID x 7 days RX for Flagyl 500 mg PO TID x 7 days  Return precautions discussed. Will follow up after labs Instructions to go to ED if symptoms worsen or persist.  Webb Silversmith, NP

## 2018-12-18 NOTE — Telephone Encounter (Signed)
Will see at upcoming appt 

## 2018-12-18 NOTE — Telephone Encounter (Signed)
Pt said this morning upper and lower abd pain (pain level 7-8 ) woke pt up.felt like burning sensation and stomach muscle contracting. If pt is still no pain but if moves does have pain and burning sensation; not as bad of pain as early AM; now pain level is 3.now pt experiencing soreness or tenderness across the upper abdomen (worse on rt side of upper abd.). pt has eaten yogurt. No constipation, no N&V and no UTI symptoms. No covid symptoms Temp at 11:15 am today was 97.2. no travel and no known exposure to + covid. Pt said she eats a lot of spicy foods and alcohol. Pt scheduled in office appt with R Baity NP 12/18/18 at 2 PM. ED precautions given and pt voiced understanding. FYI to Avie Echevaria NP.

## 2018-12-18 NOTE — Patient Instructions (Signed)
Abdominal Pain, Adult    Many things can cause belly (abdominal) pain. Most times, belly pain is not dangerous. Many cases of belly pain can be watched and treated at home. Sometimes belly pain is serious, though. Your doctor will try to find the cause of your belly pain.  Follow these instructions at home:  · Take over-the-counter and prescription medicines only as told by your doctor. Do not take medicines that help you poop (laxatives) unless told to by your doctor.  · Drink enough fluid to keep your pee (urine) clear or pale yellow.  · Watch your belly pain for any changes.  · Keep all follow-up visits as told by your doctor. This is important.  Contact a doctor if:  · Your belly pain changes or gets worse.  · You are not hungry, or you lose weight without trying.  · You are having trouble pooping (constipated) or have watery poop (diarrhea) for more than 2-3 days.  · You have pain when you pee or poop.  · Your belly pain wakes you up at night.  · Your pain gets worse with meals, after eating, or with certain foods.  · You are throwing up and cannot keep anything down.  · You have a fever.  Get help right away if:  · Your pain does not go away as soon as your doctor says it should.  · You cannot stop throwing up.  · Your pain is only in areas of your belly, such as the right side or the left lower part of the belly.  · You have bloody or black poop, or poop that looks like tar.  · You have very bad pain, cramping, or bloating in your belly.  · You have signs of not having enough fluid or water in your body (dehydration), such as:  ? Dark pee, very little pee, or no pee.  ? Cracked lips.  ? Dry mouth.  ? Sunken eyes.  ? Sleepiness.  ? Weakness.  This information is not intended to replace advice given to you by your health care provider. Make sure you discuss any questions you have with your health care provider.  Document Released: 11/07/2007 Document Revised: 12/09/2015 Document Reviewed: 11/02/2015  Elsevier  Interactive Patient Education © 2020 [employer]

## 2018-12-19 ENCOUNTER — Encounter: Payer: Self-pay | Admitting: Internal Medicine

## 2018-12-26 MED ORDER — FLUCONAZOLE 150 MG PO TABS: 150.0000 mg | ORAL_TABLET | Freq: Once | ORAL | 0 refills | Status: AC

## 2019-01-27 LAB — HM DIABETES EYE EXAM

## 2019-01-30 ENCOUNTER — Encounter: Payer: Self-pay | Admitting: Family Medicine

## 2019-03-03 ENCOUNTER — Encounter: Payer: Self-pay | Admitting: Family Medicine

## 2019-03-03 ENCOUNTER — Other Ambulatory Visit: Payer: Self-pay

## 2019-03-03 ENCOUNTER — Ambulatory Visit: Payer: BLUE CROSS/BLUE SHIELD | Admitting: Family Medicine

## 2019-03-03 VITALS — BP 148/86 | HR 94 | Temp 98.3°F | Resp 14 | Ht 66.0 in | Wt 263.9 lb

## 2019-03-03 DIAGNOSIS — F319 Bipolar disorder, unspecified: Secondary | ICD-10-CM

## 2019-03-03 DIAGNOSIS — H109 Unspecified conjunctivitis: Secondary | ICD-10-CM

## 2019-03-03 DIAGNOSIS — M25561 Pain in right knee: Secondary | ICD-10-CM

## 2019-03-03 DIAGNOSIS — Z23 Encounter for immunization: Secondary | ICD-10-CM

## 2019-03-03 DIAGNOSIS — E119 Type 2 diabetes mellitus without complications: Secondary | ICD-10-CM | POA: Diagnosis not present

## 2019-03-03 DIAGNOSIS — R1011 Right upper quadrant pain: Secondary | ICD-10-CM | POA: Diagnosis not present

## 2019-03-03 DIAGNOSIS — Z7689 Persons encountering health services in other specified circumstances: Secondary | ICD-10-CM

## 2019-03-03 DIAGNOSIS — Z6841 Body Mass Index (BMI) 40.0 and over, adult: Secondary | ICD-10-CM

## 2019-03-03 MED ORDER — CROMOLYN SODIUM 4 % OP SOLN: 1.0000 [drp] | Freq: Four times a day (QID) | OPHTHALMIC | 0 refills | Status: DC

## 2019-03-03 NOTE — Patient Instructions (Addendum)
Lumify - eye drop over the counter if the allergy eye drops don't help  Look at trying trulicity - different med for diabetes

## 2019-03-03 NOTE — Progress Notes (Signed)
Name: Caitlin Gomez   MRN: [id number]    DOB: Apr 27, 1981   Date:03/03/2019       Progress Note  Chief Complaint  Patient presents with  . Establish Care  . Conjunctivitis    bilateral eye pain, with redness, swelling and discharge     Subjective:   Caitlin Gomez is a 38 y.o. female, presents to establish care here today, also has complaint of conjunctivitis, right knee pain, right upper quadrant abdominal pain and has several chronic conditions  Transferring care here from another PCP -  -her insurance is changing and her PCP will no longer be in network  RUQ abdominal pain, comes and goes, is a nagging pain, was more severe with an episode last Christmas but went away, it came back again 4 days ago pain returned and lasted for 2 days . she can't remember anything about when the pain started again.  Pain is described as "nagging" and "annoying", located to the right upper quadrant with radiation straight through to her back, since onset 4 days ago has gradually improved, was fairly constant with waves of worsening more acute pain. pain currently rated 2/10, sometimes better with eating sometimes worse.  When she had the same pain in the past it resolved on its own, she talked to her past PCP about it but it was long after he resolved so she has not done any work-up for this before. While she was having more severe abdominal pain and initially after onset she denies any bowel changes, urinary changes specifically denies hematuria, dysuria, urinary frequency or urgency, denies vaginal symptoms or discharge.  There was no change to her pain with inspiration or positional changes, and no alleviating factors.  Former smoker -when she was a teenager she smoked for about a year Having periods - regular menses about monthy, but flow has been heavier and lihter for the last couple months  She has no hx of htn, but she reports that she gets very anxious going to the doctors especially  anxious with new people and she is visibly nervous today.  Blood pressure mildly elevated  Diabetes Mellitus Type II: Currently managing with metformin 1000 mg BID, glipizide 10 mg BID Pt notes good med compliance Pt has no SE from meds, tolerating Not checking blood sugar right now-  No hypoglycemic episodes - a few low episodes where she felt shaky - after working hard on her diet and nutrition Denies: Polyuria, polydipsia, polyphagia, vision changes, or neuropathy  Recent pertinent labs: Lab Results  Component Value Date   HGBA1C 8.4 (H) 12/09/2018   HGBA1C 8.3 (H) 03/21/2018   HGBA1C 8.6 (H) 12/20/2017      Component Value Date/Time   NA 138 12/18/2018 1434   NA 138 11/21/2012 1356   K 4.7 12/18/2018 1434   K 3.5 11/21/2012 1356   CL 102 12/18/2018 1434   CL 105 11/21/2012 1356   CO2 27 12/18/2018 1434   CO2 23 11/21/2012 1356   GLUCOSE 112 (H) 12/18/2018 1434   GLUCOSE 188 (H) 11/21/2012 1356   BUN 8 12/18/2018 1434   BUN 9 11/21/2012 1356   CREATININE 0.65 12/18/2018 1434   CREATININE 0.75 10/23/2016 1255   CALCIUM 10.1 12/18/2018 1434   CALCIUM 9.1 11/21/2012 1356   PROT 7.5 12/18/2018 1434   PROT 8.1 11/21/2012 1356   ALBUMIN 4.5 12/18/2018 1434   ALBUMIN 4.3 11/21/2012 1356   AST 41 (H) 12/18/2018 1434   AST 14 (L) 11/21/2012  1356   ALT 53 (H) 12/18/2018 1434   ALT 32 11/21/2012 1356   ALKPHOS 58 12/18/2018 1434   ALKPHOS 55 11/21/2012 1356   BILITOT 0.3 12/18/2018 1434   BILITOT 0.5 11/21/2012 1356   GFRNONAA >60 11/21/2012 1356   GFRAA >60 11/21/2012 1356   Current diet: in general, a "healthy" diet   Current exercise: walking  Current injury (right knee) so that has limited exercise for the past 1-2 months UTD on DM foot exam and eye exam ACEI/ARB: Yes Statin: No  Bipolar -patient is on Lamictal and does see a psychiatrist towards [place], not able to see records in care everywhere, there is also on her chart Seroquel which she has for as needed use  for insomnia  Bilateral eye swelling and discharge- Patient notes for the past week she has had mild drainage and morning crusting on both of her eyes with slightly swollen eyelids.  She did have a sensation especially in her right eye of foreign body and it was a little bit scratchy and painful for a few days, there was no redness or purulent drainage, no visual disturbances.  She did use over-the-counter red eye eyedrops and has gradually gotten better the pain is completely resolved, she has no current redness in her eye but she continues to have a little bit of swelling on bilateral upper and lower eyelids and some crusting.  She does have a history of seasonal allergies, food allergies, and chronic idiopathic hives.  She denies any itching to her eyes right now, denies any eye pain.  She has a 92-year-old daughter but she has not been around any other children families or anyone with any illness.  Right knee pain-patient is wearing a knee brace, she started to exercise and it caused her right knee to hurt so she had some limping and is slowly feeling better she has not seen anyone for it and is just wearing a brace and resting  Patient Active Problem List   Diagnosis Date Noted  . Left foot pain 05/14/2018  . LLQ pain 04/22/2018  . Hypercholesterolemia 03/28/2018  . Poison ivy dermatitis 02/12/2018  . Vagina, candidiasis 12/24/2017  . Injury of right middle finger 10/15/2017  . Acute upper back pain 09/13/2017  . Morbid obesity with BMI of 40.0-44.9, adult ([place]) 09/05/2017  . History of diverticulitis 09/05/2017  . Incidental lung nodule 02/18/2017  . Allergic rhinitis 09/05/2016  . Fatty liver 06/10/2016  . Bipolar disorder ([place]) 03/05/2016  . Essential hypertension 03/05/2016  . Type 2 diabetes mellitus without complication, without long-term current use of insulin ([place]) 03/05/2016    Past Surgical History:  Procedure Laterality Date  . TONSILLECTOMY AND ADENOIDECTOMY      Family  History  Problem Relation Age of Onset  . Alcohol abuse Mother   . Hyperlipidemia Mother   . Hypertension Mother   . Mental illness Mother   . Diabetes Mother   . Diabetes Father   . Mental illness Paternal Aunt   . Cancer Paternal Aunt        lung  . Early death Maternal Grandmother   . Mental illness Maternal Grandmother   . Stroke Paternal Grandfather   . Diabetes Paternal Grandfather   . Diabetes Paternal Grandmother     Social History   Socioeconomic History  . Marital status: Married    Spouse name: Quita Skye  . Number of children: 1  . Years of education: 21  . Highest education level: Some college, no  degree  Occupational History  . Occupation: Probation officer, Sales executive  Social Needs  . Financial resource strain: Not hard at all  . Food insecurity    Worry: Never true    Inability: Never true  . Transportation needs    Medical: No    Non-medical: No  Tobacco Use  . Smoking status: Former Smoker    Types: Cigarettes  . Smokeless tobacco: Never Used  Substance and Sexual Activity  . Alcohol use: Not Currently  . Drug use: No  . Sexual activity: Yes  Lifestyle  . Physical activity    Days per week: 0 days    Minutes per session: 0 min  . Stress: Very much  Relationships  . Social connections    Talks on phone: More than three times a week    Gets together: Three times a week    Attends religious service: Never    Active member of club or organization: No    Attends meetings of clubs or organizations: Never    Relationship status: Married  . Intimate partner violence    Fear of current or ex partner: No    Emotionally abused: No    Physically abused: No    Forced sexual activity: No  Other Topics Concern  . Not on file  Social History Narrative   Born in [place] and has  Lived here since 38 years old.       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       Reads a lot.       Married.      82 year old daughter.         Current Outpatient Medications:  .   albuterol (PROVENTIL HFA;VENTOLIN HFA) 108 (90 Base) MCG/ACT inhaler, Inhale 2 puffs into the lungs every 6 (six) hours as needed for wheezing or shortness of breath., Disp: 1 Inhaler, Rfl: 2 .  glipiZIDE (GLUCOTROL) 10 MG tablet, TAKE 1 TABLET BY MOUTH TWICE (2) DAILY BEFORE MEALS, Disp: 180 tablet, Rfl: 3 .  lamoTRIgine (LAMICTAL) 100 MG tablet, Take 250 mg by mouth daily. , Disp: , Rfl:  .  lisinopril (ZESTRIL) 10 MG tablet, Take 1 tablet (10 mg total) by mouth daily., Disp: 90 tablet, Rfl: 3 .  loratadine (CLARITIN) 10 MG tablet, Take 10 mg by mouth daily., Disp: , Rfl:  .  metFORMIN (GLUCOPHAGE) 500 MG tablet, TAKE 2 TABLETS BY MOUTH TWICE A DAY WITHA MEAL., Disp: 360 tablet, Rfl: 3 .  QUEtiapine (SEROQUEL) 50 MG tablet, Take 50 mg by mouth as needed. , Disp: , Rfl:  .  glucose blood test strip, Contour Next Test Strips, Disp: 100 each, Rfl: 11 .  Insulin Pen Needle 29G X 12.7MM MISC, Inject trulicity weekly, Disp: 100 each, Rfl: 11  Allergies  Allergen Reactions  . Amoxicillin Other (See Comments)    Chest pain   . Clindamycin Swelling  . Kale     Tongue swelling  . Oysters [Shellfish Allergy]     Dizzy, nauseated  . Ziprasidone Hcl Swelling  . Amoxicillin-Pot Clavulanate Rash    I personally reviewed active problem list, medication list, allergies, family history, social history, health maintenance, notes from last encounter, lab results with the patient/caregiver today.  Review of Systems  Constitutional: Negative.   HENT: Negative.   Eyes: Negative.   Respiratory: Negative.   Cardiovascular: Negative.   Gastrointestinal: Negative.   Endocrine: Negative.   Genitourinary: Negative.   Musculoskeletal: Negative.   Skin: Negative.   Allergic/Immunologic:  Negative.   Neurological: Negative.   Hematological: Negative.   Psychiatric/Behavioral: Negative.   All other systems reviewed and are negative.    Objective:    Vitals:   03/03/19 1114 03/03/19 1125  BP: (!)  152/86 (!) 148/86  Pulse: 94   Resp: 14   Temp: 98.3 F (36.8 C)   SpO2: 98%   Weight: 263 lb 14.4 oz (119.7 kg)   Height: 5\' 6"  (1.676 m)     Body mass index is 42.59 kg/m.  Physical Exam Vitals signs and nursing note reviewed.  Constitutional:      General: She is not in acute distress.    Appearance: Normal appearance. She is well-developed. She is not ill-appearing, toxic-appearing or diaphoretic.     Interventions: Face mask in place.  HENT:     Head: Normocephalic and atraumatic.     Right Ear: External ear normal.     Left Ear: External ear normal.  Eyes:     General: No allergic shiner or scleral icterus.       Right eye: No discharge.        Left eye: No discharge.     Extraocular Movements: Extraocular movements intact.     Conjunctiva/sclera: Conjunctivae normal.     Right eye: Right conjunctiva is not injected. No chemosis, exudate or hemorrhage.    Left eye: Left conjunctiva is not injected. No chemosis, exudate or hemorrhage.    Comments: Mild upper and lower bilateral edema to the eyelids, some dried crusting bilaterally around the outer corners of her eyes and some on her eyelashes She has no conjunctival injection No palpebral conjunctival erythema or cobblestoning No visualized foreign body  Neck:     Musculoskeletal: Normal range of motion and neck supple.     Trachea: Phonation normal. No tracheal deviation.  Cardiovascular:     Rate and Rhythm: Normal rate and regular rhythm.     Pulses: Normal pulses.          Radial pulses are 2+ on the right side and 2+ on the left side.       Posterior tibial pulses are 2+ on the right side and 2+ on the left side.     Heart sounds: Normal heart sounds. No murmur. No friction rub. No gallop.   Pulmonary:     Effort: Pulmonary effort is normal. No respiratory distress.     Breath sounds: Normal breath sounds. No stridor. No wheezing, rhonchi or rales.  Chest:     Chest wall: No tenderness.  Abdominal:      General: Bowel sounds are normal. There is no distension.     Palpations: Abdomen is soft.     Tenderness: There is no abdominal tenderness. There is no guarding or rebound.  Musculoskeletal: Normal range of motion.        General: No deformity.     Right lower leg: No edema.     Left lower leg: No edema.  Lymphadenopathy:     Cervical: No cervical adenopathy.  Skin:    General: Skin is warm and dry.     Capillary Refill: Capillary refill takes less than 2 seconds.     Coloration: Skin is not jaundiced or pale.     Findings: No rash.  Neurological:     Mental Status: She is alert and oriented to person, place, and time.     Motor: No abnormal muscle tone.     Gait: Gait normal.  Psychiatric:  Speech: Speech normal.        Behavior: Behavior normal.      Recent Results (from the past 2160 hour(s))  Comprehensive metabolic panel     Status: Abnormal   Collection Time: 12/09/18 11:29 AM  Result Value Ref Range   Sodium 136 135 - 145 mEq/L   Potassium 4.2 3.5 - 5.1 mEq/L   Chloride 102 96 - 112 mEq/L   CO2 26 19 - 32 mEq/L   Glucose, Bld 171 (H) 70 - 99 mg/dL   BUN 7 6 - 23 mg/dL   Creatinine, Ser 0.64 0.40 - 1.20 mg/dL   Total Bilirubin 0.4 0.2 - 1.2 mg/dL   Alkaline Phosphatase 48 39 - 117 U/L   AST 23 0 - 37 U/L   ALT 35 0 - 35 U/L   Total Protein 6.6 6.0 - 8.3 g/dL   Albumin 4.0 3.5 - 5.2 g/dL   Calcium 8.8 8.4 - 10.5 mg/dL   GFR 104.82 >60.00 mL/min  Lipid panel     Status: Abnormal   Collection Time: 12/09/18 11:29 AM  Result Value Ref Range   Cholesterol 200 0 - 200 mg/dL    Comment: ATP III Classification       Desirable:  < 200 mg/dL               Borderline High:  200 - 239 mg/dL          High:  > = 240 mg/dL   Triglycerides 214.0 (H) 0.0 - 149.0 mg/dL    Comment: Normal:  <150 mg/dLBorderline High:  150 - 199 mg/dL   HDL 39.40 >39.00 mg/dL   VLDL 42.8 (H) 0.0 - 40.0 mg/dL   Total CHOL/HDL Ratio 5     Comment:                Men          Women1/2  Average Risk     3.4          3.3Average Risk          5.0          4.42X Average Risk          9.6          7.13X Average Risk          15.0          11.0                       NonHDL 160.46     Comment: NOTE:  Non-HDL goal should be 30 mg/dL higher than patient's LDL goal (i.e. LDL goal of < 70 mg/dL, would have non-HDL goal of < 100 mg/dL)  Hemoglobin A1c     Status: Abnormal   Collection Time: 12/09/18 11:29 AM  Result Value Ref Range   Hgb A1c MFr Bld 8.4 (H) 4.6 - 6.5 %    Comment: Glycemic Control Guidelines for People with Diabetes:Non Diabetic:  <6%Goal of Therapy: <7%Additional Action Suggested:  >8%   LDL cholesterol, direct     Status: None   Collection Time: 12/09/18 11:29 AM  Result Value Ref Range   Direct LDL 129.0 mg/dL    Comment: Optimal:  <100 mg/dLNear or Above Optimal:  100-129 mg/dLBorderline High:  130-159 mg/dLHigh:  160-189 mg/dLVery High:  >190 mg/dL  POCT urine pregnancy     Status: Abnormal   Collection Time: 12/18/18  2:29 PM  Result Value Ref Range  Preg Test, Ur Negative Negative  POCT URINALYSIS DIP (CLINITEK)     Status: Abnormal   Collection Time: 12/18/18  2:30 PM  Result Value Ref Range   Color, UA yellow yellow   Clarity, UA clear clear   Glucose, UA negative negative mg/dL   Bilirubin, UA negative negative   Ketones, POC UA negative negative mg/dL   Spec Grav, UA 1.020 1.010 - 1.025   Blood, UA trace-lysed (A) negative    Comment: 2+   pH, UA 5.0 5.0 - 8.0   POC PROTEIN,UA negative negative, trace   Urobilinogen, UA negative (A) 0.2 or 1.0 E.U./dL   Nitrite, UA Negative Negative   Leukocytes, UA Negative Negative  CBC     Status: None   Collection Time: 12/18/18  2:34 PM  Result Value Ref Range   WBC 9.4 4.0 - 10.5 K/uL   RBC 4.96 3.87 - 5.11 Mil/uL   Platelets 355.0 150.0 - 400.0 K/uL   Hemoglobin 13.7 12.0 - 15.0 g/dL   HCT 41.1 36.0 - 46.0 %   MCV 82.7 78.0 - 100.0 fl   MCHC 33.3 30.0 - 36.0 g/dL   RDW 13.7 11.5 - 15.5 %   Comprehensive metabolic panel     Status: Abnormal   Collection Time: 12/18/18  2:34 PM  Result Value Ref Range   Sodium 138 135 - 145 mEq/L   Potassium 4.7 3.5 - 5.1 mEq/L   Chloride 102 96 - 112 mEq/L   CO2 27 19 - 32 mEq/L   Glucose, Bld 112 (H) 70 - 99 mg/dL   BUN 8 6 - 23 mg/dL   Creatinine, Ser 0.65 0.40 - 1.20 mg/dL   Total Bilirubin 0.3 0.2 - 1.2 mg/dL   Alkaline Phosphatase 58 39 - 117 U/L   AST 41 (H) 0 - 37 U/L   ALT 53 (H) 0 - 35 U/L   Total Protein 7.5 6.0 - 8.3 g/dL   Albumin 4.5 3.5 - 5.2 g/dL   Calcium 10.1 8.4 - 10.5 mg/dL   GFR 102.95 >60.00 mL/min  Amylase     Status: Abnormal   Collection Time: 12/18/18  2:34 PM  Result Value Ref Range   Amylase 21 (L) 27 - 131 U/L  Lipase     Status: None   Collection Time: 12/18/18  2:34 PM  Result Value Ref Range   Lipase 33.0 11.0 - 59.0 U/L  HM DIABETES EYE EXAM     Status: None   Collection Time: 01/27/19 12:00 AM  Result Value Ref Range   HM Diabetic Eye Exam No Retinopathy No Retinopathy    PHQ2/9: Depression screen [facility] 2/9 03/03/2019 03/04/2017 11/14/2016 08/06/2016 04/23/2016  Decreased Interest 2 0 0 2 0  Down, Depressed, Hopeless 2 0 0 1 0  PHQ - 2 Score 4 0 0 3 0  Altered sleeping 2 - - 3 -  Tired, decreased energy 3 - - 1 -  Change in appetite 0 - - 1 -  Feeling bad or failure about yourself  0 - - 1 -  Trouble concentrating 1 - - 2 -  Moving slowly or fidgety/restless 0 - - 2 -  Suicidal thoughts 0 - - 0 -  PHQ-9 Score 10 - - 13 -  Difficult doing work/chores Somewhat difficult - - Somewhat difficult -    phq 9 is positive Currently on meds and see psychiatry  Fall Risk: Fall Risk  03/03/2019 03/04/2017 11/14/2016 08/06/2016 04/23/2016  Falls in the past  year? 0 No No No No  Number falls in past yr: 0 - - - -  Injury with Fall? 0 - - - -     Functional Status Survey: Is the patient deaf or have difficulty hearing?: No Does the patient have difficulty seeing, even when wearing glasses/contacts?: No  Does the patient have difficulty concentrating, remembering, or making decisions?: No Does the patient have difficulty walking or climbing stairs?: No Does the patient have difficulty dressing or bathing?: No Does the patient have difficulty doing errands alone such as visiting a doctor's office or shopping?: No    Assessment & Plan:    1. Type 2 diabetes mellitus without complication, without long-term current use of insulin ([place]) Patient is having some hypoglycemic episodes with glipizide 10 mg twice a day, she had this more frequently as she has been working on [employer] and exercise Discussed Tx options -I advised discontinuing glipizide and starting Trulicity, patient is hesitant to do this she states she does have a prescription of this at the pharmacy Other option is to decrease the glipizide dose Since she is working on diet and exercise would hate for her to have a high dose or eat more which would lead to more weight gain, just to avoid a side effect  Plan for starting Trulicity would be to have her come for nurse visits to help her with the injections while she got comfortable with them and follow-up in 1 month  Patient was hesitant to start this plan so we schedule a 65-month follow-up  2. Abdominal pain, RUQ Several episodes of colicky right upper quadrant pain does seem somewhat related to eating DDx biliary colic, cholelithiasis, currently without signs of cholecystitis, peptic ulcer also in the differential - COMPLETE METABOLIC PANEL WITH GFR - Lipase - POCT urinalysis dipstick Right upper quadrant ultrasound - US Abdomen Limited RUQ; Future  4. Conjunctivitis of both eyes, unspecified conjunctivitis type Viral versus allergic, versus dry eye Do not believe antibiotic drops are currently indicated, no purulent discharge, more suspicious for allergic conjunctivitis Plan to make sure she is taking her over-the-counter allergy medicines daily, continue her current  over-the-counter eyedrop, trial of cromolyn.  If she continues to have irritation advised her to try Lumify OTC drops - cromolyn (OPTICROM) 4 % ophthalmic solution; Place 1 drop into both eyes 4 (four) times daily.  Dispense: 10 mL; Refill: 0  5. Morbid obesity with BMI of 40.0-44.9, adult ([place]) Encouraged healthy balanced diet, avoiding simple sugars, have a calorie deficit and start exercising when able  6. Bipolar affective disorder, remission status unspecified ([place]) See psychiatry  7. Need for influenza vaccination - Flu Vaccine QUAD 6+ mos PF IM (Fluarix Quad PF)  8. Acute pain of right knee Patient resting and wearing a brace, encouraged follow-up if not gradually improving  9. Encounter to establish care with new doctor Reviewed recent labs, recent records and past medical history   Return in about 3 months (around 06/02/2019) for DM, HTN.   Delsa Grana, PA-C 03/03/19 11:36 AM

## 2019-03-04 LAB — COMPLETE METABOLIC PANEL WITH GFR
AG Ratio: 1.5 (calc) (ref 1.0–2.5)
ALT: 49 U/L — ABNORMAL HIGH (ref 6–29)
AST: 40 U/L — ABNORMAL HIGH (ref 10–30)
Albumin: 4.1 g/dL (ref 3.6–5.1)
Alkaline phosphatase (APISO): 51 U/L (ref 31–125)
BUN: 11 mg/dL (ref 7–25)
CO2: 27 mmol/L (ref 20–32)
Calcium: 9.5 mg/dL (ref 8.6–10.2)
Chloride: 102 mmol/L (ref 98–110)
Creat: 0.71 mg/dL (ref 0.50–1.10)
GFR, Est African American: 127 mL/min/{1.73_m2} (ref >=60)
GFR, Est Non African American: 110 mL/min/{1.73_m2} (ref >=60)
Globulin: 2.7 g/dL (calc) (ref 1.9–3.7)
Glucose, Bld: 159 mg/dL — ABNORMAL HIGH (ref 65–99)
Potassium: 4.8 mmol/L (ref 3.5–5.3)
Sodium: 134 mmol/L — ABNORMAL LOW (ref 135–146)
Total Bilirubin: 0.5 mg/dL (ref 0.2–1.2)
Total Protein: 6.8 g/dL (ref 6.1–8.1)

## 2019-03-04 LAB — LIPASE: Lipase: 21 U/L (ref 7–60)

## 2019-03-06 ENCOUNTER — Telehealth: Payer: Self-pay | Admitting: Family Medicine

## 2019-03-06 DIAGNOSIS — E119 Type 2 diabetes mellitus without complications: Secondary | ICD-10-CM

## 2019-03-06 NOTE — Telephone Encounter (Signed)
-----   Message from Ellamae Sia sent at 03/04/2019 12:54 PM EDT ----- Regarding: Lab orders for Wednesday, 10.7.20 Lab orders for f/u labs

## 2019-03-09 ENCOUNTER — Telehealth: Payer: Self-pay

## 2019-03-09 NOTE — Telephone Encounter (Signed)
Left detailed VM w COVID screen and back door lab info and front door info   

## 2019-03-11 ENCOUNTER — Other Ambulatory Visit: Payer: BLUE CROSS/BLUE SHIELD

## 2019-03-12 ENCOUNTER — Ambulatory Visit: Payer: BLUE CROSS/BLUE SHIELD

## 2019-03-12 ENCOUNTER — Encounter: Payer: Self-pay | Admitting: Family Medicine

## 2019-03-12 DIAGNOSIS — I10 Essential (primary) hypertension: Secondary | ICD-10-CM

## 2019-03-12 DIAGNOSIS — E119 Type 2 diabetes mellitus without complications: Secondary | ICD-10-CM

## 2019-03-12 MED ORDER — ALBUTEROL SULFATE HFA 108 (90 BASE) MCG/ACT IN AERS: 2.0000 | INHALATION_SPRAY | Freq: Four times a day (QID) | RESPIRATORY_TRACT | 1 refills | Status: AC | PRN

## 2019-03-12 MED ORDER — GLIPIZIDE 10 MG PO TABS: ORAL_TABLET | ORAL | 1 refills | Status: DC

## 2019-03-12 MED ORDER — FLUTICASONE PROPIONATE 50 MCG/ACT NA SUSP: 2.0000 | Freq: Every day | NASAL | 2 refills | Status: DC

## 2019-03-12 MED ORDER — METFORMIN HCL 500 MG PO TABS: ORAL_TABLET | ORAL | 3 refills | Status: DC

## 2019-03-12 MED ORDER — LISINOPRIL 10 MG PO TABS: 10.0000 mg | ORAL_TABLET | Freq: Every day | ORAL | 3 refills | Status: DC

## 2019-03-18 ENCOUNTER — Ambulatory Visit: Payer: Self-pay

## 2019-03-18 NOTE — Telephone Encounter (Addendum)
Incoming call from Pt. With complaint of vaginal spotting  LMP was Oct 2.  Patient states that  Vaginal spotting is very light does not  reach the liner. Patient wanted to inform PCP. Patient would like a return   Call from PCP  To discuss.           Answer Assessment - Initial Assessment Questions 1. SYMPTOM: "What's the main symptom you're concerned about?" (e.g., pain, itching, dryness)     Spoting.  Last 8 days  2. LOCATION: "Where is the  *No Answer* located?" (e.g., inside/outside, left/right)   3. ONSET: "When did the  *No Answer*  start?"    Last 8 days 4. PAIN: "Is there any pain?" If so, ask: "How bad is it?" (Scale: 1-10; mild, moderate, severe)   denies 5. ITCHING: "Is there any itching?" If so, ask: "How bad is it?" (Scale: 1-10; mild, moderate, severe)   denies 6. CAUSE: "What do you think is causing the discharge?" "Have you had the same problem before? What happened then?"    dont know 7. OTHER SYMPTOMS: "Do you have any other symptoms?" (e.g., fever, itching, vaginal bleeding, pain with urination, injury to genital area, vaginal foreign body)    Vaginal bleeding light  8. PREGNANCY: "Is there any chance you are pregnant?" "When was your last menstrual period?"     Two pregnacy test  Negative.  Protocols used: VAGINAL [facility]

## 2019-03-18 NOTE — Telephone Encounter (Signed)
Spoke with Caitlin Gomez, pt did take 2 at home pregnancy test which were negative.  Pt denies any pain or heavy bleeding.  Pt was told if concerned or spotting does not stop to make an appt

## 2019-03-19 ENCOUNTER — Ambulatory Visit: Payer: BLUE CROSS/BLUE SHIELD | Admitting: Family Medicine

## 2019-04-17 ENCOUNTER — Ambulatory Visit: Payer: Self-pay | Admitting: *Deleted

## 2019-04-17 NOTE — Telephone Encounter (Signed)
Summary: right sided pain   Pt states she would like to speak with a nurse. States that she is having pain on upper right side. Pt states that the pain is not severe, just annoying. Pt wants to know what she may be able to do to help.      Pain R side under ribcage area URQ- could be gallstone/ulcer. Patient has been seen for this pain before and it subsided and she did not follow up with [place]. Advised no open appointment- called and verified. Advised UC for pain- patient states it is really expensive to do that- urged her to go while she is having pain.  Reason for Disposition . [1] MILD-MODERATE pain AND [2] constant AND [3] present > 2 hours  Answer Assessment - Initial Assessment Questions 1. LOCATION: "Where does it hurt?"      R right under the rib cage- RUQ 2. RADIATION: "Does the pain shoot anywhere else?" (e.g., chest, back)     Feels muscle tightening in the back- does help to lay down or on side 3. ONSET: "When did the pain begin?" (e.g., minutes, hours or days ago)     Started again yesterday morning 4. SUDDEN: "Gradual or sudden onset?"     sudden 5. PATTERN "Does the pain come and go, or is it constant?"    - If constant: "Is it getting better, staying the same, or worsening?"      (Note: Constant means the pain never goes away completely; most serious pain is constant and it progresses)     - If intermittent: "How long does it last?" "Do you have pain now?"     (Note: Intermittent means the pain goes away completely between bouts)     Constant for intermittent time period- lasting 2-3 days then disappears 6. SEVERITY: "How bad is the pain?"  (e.g., Scale 1-10; mild, moderate, or severe)   - MILD (1-3): doesn't interfere with normal activities, abdomen soft and not tender to touch    - MODERATE (4-7): interferes with normal activities or awakens from sleep, tender to touch    - SEVERE (8-10): excruciating pain, doubled over, unable to do any normal activities       moderate 7. RECURRENT SYMPTOM: "Have you ever had this type of abdominal pain before?" If so, ask: "When was the last time?" and "What happened that time?"      Yes- order [place]- trying to wait for insurance change- went away 8. CAUSE: "What do you think is causing the abdominal pain?"     Possible gallstone/ulcer 9. RELIEVING/AGGRAVATING FACTORS: "What makes it better or worse?" (e.g., movement, antacids, bowel movement)     Laying down helps- OTC products antiacid, ibuprofen don't help 10. OTHER SYMPTOMS: "Has there been any vomiting, diarrhea, constipation, or urine problems?"       Urinary problems- felt she might have had UTI before the pain started- patient drank cranberry water 11. PREGNANCY: "Is there any chance you are pregnant?" "When was your last menstrual period?"       No- LMP- 2 weeks ago  Protocols used: ABDOMINAL PAIN - UPPER-A-AH, ABDOMINAL PAIN - FEMALE-A-AH

## 2019-04-20 NOTE — Telephone Encounter (Signed)
LVM checking on patient and to try and get her scheduled to see Delsa Grana.

## 2019-04-29 ENCOUNTER — Ambulatory Visit (INDEPENDENT_AMBULATORY_CARE_PROVIDER_SITE_OTHER): Payer: BLUE CROSS/BLUE SHIELD | Admitting: Family Medicine

## 2019-04-29 ENCOUNTER — Other Ambulatory Visit: Payer: Self-pay

## 2019-04-29 ENCOUNTER — Encounter: Payer: Self-pay | Admitting: Family Medicine

## 2019-04-29 VITALS — BP 144/86 | HR 102 | Temp 98.5°F | Resp 14 | Ht 66.0 in | Wt 264.1 lb

## 2019-04-29 DIAGNOSIS — E119 Type 2 diabetes mellitus without complications: Secondary | ICD-10-CM

## 2019-04-29 DIAGNOSIS — L501 Idiopathic urticaria: Secondary | ICD-10-CM | POA: Diagnosis not present

## 2019-04-29 DIAGNOSIS — Z0000 Encounter for general adult medical examination without abnormal findings: Secondary | ICD-10-CM

## 2019-04-29 DIAGNOSIS — Z1329 Encounter for screening for other suspected endocrine disorder: Secondary | ICD-10-CM

## 2019-04-29 DIAGNOSIS — Z130 Encounter for screening for diseases of the blood and blood-forming organs and certain disorders involving the immune mechanism: Secondary | ICD-10-CM

## 2019-04-29 DIAGNOSIS — E7800 Pure hypercholesterolemia, unspecified: Secondary | ICD-10-CM

## 2019-04-29 DIAGNOSIS — E8881 Metabolic syndrome: Secondary | ICD-10-CM

## 2019-04-29 DIAGNOSIS — Z6841 Body Mass Index (BMI) 40.0 and over, adult: Secondary | ICD-10-CM

## 2019-04-29 DIAGNOSIS — R109 Unspecified abdominal pain: Secondary | ICD-10-CM

## 2019-04-29 DIAGNOSIS — I10 Essential (primary) hypertension: Secondary | ICD-10-CM | POA: Diagnosis not present

## 2019-04-29 DIAGNOSIS — R300 Dysuria: Secondary | ICD-10-CM

## 2019-04-29 DIAGNOSIS — Z5181 Encounter for therapeutic drug level monitoring: Secondary | ICD-10-CM

## 2019-04-29 DIAGNOSIS — N926 Irregular menstruation, unspecified: Secondary | ICD-10-CM

## 2019-04-29 DIAGNOSIS — Z13228 Encounter for screening for other metabolic disorders: Secondary | ICD-10-CM

## 2019-04-29 DIAGNOSIS — F319 Bipolar disorder, unspecified: Secondary | ICD-10-CM

## 2019-04-29 DIAGNOSIS — K760 Fatty (change of) liver, not elsewhere classified: Secondary | ICD-10-CM

## 2019-04-29 MED ORDER — DESLORATADINE 5 MG PO TABS: 5.0000 mg | ORAL_TABLET | Freq: Every day | ORAL | 2 refills | Status: DC

## 2019-04-29 NOTE — Patient Instructions (Addendum)
For hives start taking clarinex, you can still do claritin (at different times of day) Add pepcid 20 mg daily if hives are worsening Can also use your hydroxyzine as needed for hives  Keep working on your diet, reducing portions and calories.  Keep watching your fasting blood sugar.  You can hold or cut in half your glipizide for low blood sugar <120 or if not eating.  Goal would be to get fasting blood sugar 100-150.  Monitor your blood pressure at home if you're able to.  We need to get it closer to 130/80, so send me a message or come in for a follow up appointment if your BP is > 130/80.   Look up GLP1 inhibitor medicines for diabetes - I know you don't want a shot, but they are easy to use, once a week, help with weight loss and help with insulin resistance, and several causes of diabetes.    I have a sample of the only oral medication that we could try if you like -  [employer] insurance company to find out the cost of the ultrasound and let me know so I can order it.   Preventive Care 36-85 Years Old, Female Preventive care refers to visits with your health care provider and lifestyle choices that can promote health and wellness. This includes:  A yearly physical exam. This may also be called an annual well check.  Regular dental visits and eye exams.  Immunizations.  Screening for certain conditions.  Healthy lifestyle choices, such as eating a healthy diet, getting regular exercise, not using drugs or products that contain nicotine and tobacco, and limiting alcohol use. What can I expect for my preventive care visit? Physical exam Your health care provider will check your:  Height and weight. This may be used to calculate body mass index (BMI), which tells if you are at a healthy weight.  Heart rate and blood pressure.  Skin for abnormal spots. Counseling Your health care provider may ask you questions about your:  Alcohol, tobacco, and drug use.  Emotional  well-being.  Home and relationship well-being.  Sexual activity.  Eating habits.  Work and work Statistician.  Method of birth control.  Menstrual cycle.  Pregnancy history. What immunizations do I need?  Influenza (flu) vaccine  This is recommended every year. Tetanus, diphtheria, and pertussis (Tdap) vaccine  You may need a Td booster every 10 years. Varicella (chickenpox) vaccine  You may need this if you have not been vaccinated. Human papillomavirus (HPV) vaccine  If recommended by your health care provider, you may need three doses over 6 months. Measles, mumps, and rubella (MMR) vaccine  You may need at least one dose of MMR. You may also need a second dose. Meningococcal conjugate (MenACWY) vaccine  One dose is recommended if you are age 36-21 years and a first-year college student living in a residence hall, or if you have one of several medical conditions. You may also need additional booster doses. Pneumococcal conjugate (PCV13) vaccine  You may need this if you have certain conditions and were not previously vaccinated. Pneumococcal polysaccharide (PPSV23) vaccine  You may need one or two doses if you smoke cigarettes or if you have certain conditions. Hepatitis A vaccine  You may need this if you have certain conditions or if you travel or work in places where you may be exposed to hepatitis A. Hepatitis B vaccine  You may need this if you have certain conditions or if you  travel or work in places where you may be exposed to hepatitis B. Haemophilus influenzae type b (Hib) vaccine  You may need this if you have certain conditions. You may receive vaccines as individual doses or as more than one vaccine together in one shot (combination vaccines). Talk with your health care provider about the risks and benefits of combination vaccines. What tests do I need?  Blood tests  Lipid and cholesterol levels. These may be checked every 5 years starting at age  36.  Hepatitis C test.  Hepatitis B test. Screening  Diabetes screening. This is done by checking your blood sugar (glucose) after you have not eaten for a while (fasting).  Sexually transmitted disease (STD) testing.  BRCA-related cancer screening. This may be done if you have a family history of breast, ovarian, tubal, or peritoneal cancers.  Pelvic exam and Pap test. This may be done every 3 years starting at age 36. Starting at age 21, this may be done every 5 years if you have a Pap test in combination with an HPV test. Talk with your health care provider about your test results, treatment options, and if necessary, the need for more tests. Follow these instructions at home: Eating and drinking   Eat a diet that includes fresh fruits and vegetables, whole grains, lean protein, and low-fat dairy.  Take vitamin and mineral supplements as recommended by your health care provider.  Do not drink alcohol if: ? Your health care provider tells you not to drink. ? You are pregnant, may be pregnant, or are planning to become pregnant.  If you drink alcohol: ? Limit how much you have to 0-1 drink a day. ? Be aware of how much alcohol is in your drink. In the [place] one drink equals one 12 oz bottle of beer (355 mL), one 5 oz glass of wine (148 mL), or one 1 oz glass of hard liquor (44 mL). Lifestyle  Take daily care of your teeth and gums.  Stay active. Exercise for at least 30 minutes on 5 or more days each week.  Do not use any products that contain nicotine or tobacco, such as cigarettes, e-cigarettes, and chewing tobacco. If you need help quitting, ask your health care provider.  If you are sexually active, practice safe sex. Use a condom or other form of birth control (contraception) in order to prevent pregnancy and STIs (sexually transmitted infections). If you plan to become pregnant, see your health care provider for a preconception visit. What's next?  Visit your health  care provider once a year for a well check visit.  Ask your health care provider how often you should have your eyes and teeth checked.  Stay up to date on all vaccines. This information is not intended to replace advice given to you by your health care provider. Make sure you discuss any questions you have with your health care provider. Document Released: 07/17/2001 Document Revised: 01/30/2018 Document Reviewed: 01/30/2018 Elsevier Patient Education  2020 [employer].

## 2019-04-29 NOTE — Progress Notes (Signed)
Patient: Caitlin Gomez, Female    DOB: Mar 19, 1981, 38 y.o.   MRN: [id number] Delsa Grana, PA-C Visit Date: 04/29/2019  Today's Provider: Delsa Grana, PA-C   Chief Complaint  Patient presents with  . Annual Exam    with pap   Subjective:   Annual physical exam:  Caitlin Gomez is a 38 y.o. female who presents today for complete physical exam:  Exercise/Activity:  Not Gomez exercising right now, knee pain still an issue  Diet/nutrition:  Eating less -   Not eating out Doing it with her spouse  Metamucil for stool and it helps curb appetite  Sleep: fairly well, lately isn't sleeping well over the last week, she goes in waves of insomnia which is normal for her  Stomach pain - possibly biliary colic also might be GI chronic diarrhea has gotten better.  She did have a severe episode a week or 2 ago she called to get an appointment but there were none available, she states that within a few hours her symptoms passed and she did not have any vomiting.  Pain has been located mostly right upper quadrant to periumbilical area without radiation, pain comes intermittently is very severe will last for several hours and then gradually decrease. Hives -does have a history of idiopathic hives, she did see specialist in the past she asked for refill of one of the medications they give her in the past that worked for her because all the other antihistamines did not -Clarinex she is to be on it 4 times a day she would like to start with just once a day see if it can decrease the hives which are getting worse during this time a year.  Diabetes-she is still only taking glipizide and Metformin and blood sugars are running high 100s, she denies any nocturia, polyuria, polydipsia polyphagia.  She still very hesitant to start any injections.  Foot and eye exam are up-to-date  USPSTF grade A and B recommendations - reviewed and addressed today  Depression:  Phq 9 completed today by patient, was  reviewed by me with patient in the room, score is  negative, pt feels having some trouble with sleep and feeling tired but her mood overall is okay she does manage this with a specialist for her bipolar PHQ 2/9 Scores 04/29/2019 03/03/2019 03/04/2017 11/14/2016  PHQ - 2 Score 5 4 0 0  PHQ- 9 Score 10 10 - -   Depression screen [facility] 2/9 04/29/2019 03/03/2019 03/04/2017 11/14/2016 08/06/2016  Decreased Interest 2 2 0 0 2  Down, Depressed, Hopeless 3 2 0 0 1  PHQ - 2 Score 5 4 0 0 3  Altered sleeping 3 2 - - 3  Tired, decreased energy 2 3 - - 1  Change in appetite 0 0 - - 1  Feeling bad or failure about yourself  0 0 - - 1  Trouble concentrating 0 1 - - 2  Moving slowly or fidgety/restless 0 0 - - 2  Suicidal thoughts 0 0 - - 0  PHQ-9 Score 10 10 - - 13  Difficult doing work/chores Not difficult at all Somewhat difficult - - Somewhat difficult   Mood - good, talks to therapist every week  Alcohol screening:   Office Visit from 04/29/2019 in [facility]  AUDIT-C Score  0      Immunizations and Health Maintenance: Health Maintenance  Topic Date Due  . FOOT EXAM  03/29/2019  . HEMOGLOBIN A1C  06/11/2019  .  OPHTHALMOLOGY EXAM  01/27/2020  . PAP SMEAR-Modifier  07/13/2020  . TETANUS/TDAP  03/11/2024  . INFLUENZA VACCINE  Completed  . PNEUMOCOCCAL POLYSACCHARIDE VACCINE AGE 80-64 HIGH RISK  Completed  . HIV Screening  Completed     Hep C Screening: not indicated  STD testing and prevention (HIV/chl/gon/syphilis): done  Intimate partner violence:  Feels safe  Sexual History/Pain during Intercourse: no pain or concers Married  Menstrual History/LMP/Abnormal Bleeding:  Irregular periods - spotting for a few months and then a bigger longer heavier period, sx for the last year.   Patient's last menstrual period was 04/05/2019.  Incontinence Symptoms: urinary symptoms - no incontinence - did have some urinary sx with recent abd pain - pushing water and fluids  Breast  cancer: none  Wants breast exam - last was 6 years ago Last Mammogram:  BRCA gene screening:   Cervical cancer screening: done 2017 due in 2022 with neg pap and HPV Family hx of cancers - breast, ovarian, uterine, colon:  Dads sister with breast CA, no other family hx known- pt doesn't know age of onset  Osteoporosis:   Discussed high calcium and vitamin D supplementation, weight bearing exercises  Skin cancer:  Hx of skin CA -  NO Discussed atypical lesions   Colorectal cancer:   colonoscopy is not currently indicated  Lung cancer:   Low Dose CT Chest recommended if Age 86-80 years, 30 pack-year currently smoking OR have quit w/in 15years. Patient does not qualify.   Social History   Tobacco Use  . Smoking status: Former Smoker    Types: Cigarettes  . Smokeless tobacco: Never Used  Substance Use Topics  . Alcohol use: Not Currently       ECG:  Blood pressure/Hypertension: BP Readings from Last 3 Encounters:  04/29/19 (!) 144/86  03/03/19 (!) 148/86  12/18/18 (!) 148/98    Weight/Obesity: Wt Readings from Last 3 Encounters:  04/29/19 264 lb 1.6 oz (119.8 kg)  03/03/19 263 lb 14.4 oz (119.7 kg)  12/18/18 260 lb (117.9 kg)   BMI Readings from Last 3 Encounters:  04/29/19 42.63 kg/m  03/03/19 42.59 kg/m  12/18/18 41.97 kg/m     Lipids:  Lab Results  Component Value Date   CHOL 200 12/09/2018   CHOL 191 03/21/2018   CHOL 211 (H) 12/20/2017   Lab Results  Component Value Date   HDL 39.40 12/09/2018   HDL 32.60 (L) 03/21/2018   HDL 33.90 (L) 12/20/2017   Lab Results  Component Value Date   LDLCALC 114 (H) 03/05/2016   Lab Results  Component Value Date   TRIG 214.0 (H) 12/09/2018   TRIG 204.0 (H) 03/21/2018   TRIG (H) 12/20/2017    443.0 Triglyceride is over 400; calculations on Lipids are invalid.   Lab Results  Component Value Date   CHOLHDL 5 12/09/2018   CHOLHDL 6 03/21/2018   CHOLHDL 6 12/20/2017   Lab Results  Component Value Date    LDLDIRECT 129.0 12/09/2018   LDLDIRECT 140.0 03/21/2018   LDLDIRECT 118.0 12/20/2017   Based on the results of lipid panel his/her cardiovascular risk factor ( using [facility] )  in the next 10 years is: The ASCVD Risk score Mikey Bussing DC Jr., et al., 2013) failed to calculate for the following reasons:   The 2013 ASCVD risk score is only valid for ages 67 to 43  Glucose:  Glucose  Date Value Ref Range Status  11/21/2012 188 (H) 65 - 99 mg/dL Final  Glucose, Bld  Date Value Ref Range Status  03/04/2019 159 (H) 65 - 99 mg/dL Final    Comment:    .            Fasting reference interval . For someone without known diabetes, a glucose value >125 mg/dL indicates that they may have diabetes and this should be confirmed with a follow-up test. .   12/18/2018 112 (H) 70 - 99 mg/dL Final  12/09/2018 171 (H) 70 - 99 mg/dL Final   Glucose-Capillary  Date Value Ref Range Status  05/12/2009 94 70 - 99 mg/dL Final      [facility] Visit [facility] 04/29/2019 in [facility]  AUDIT-C Score  0     Depression: Phq 9 is  positive Depression screen [facility] 2/9 04/29/2019 03/03/2019 03/04/2017 11/14/2016 08/06/2016  Decreased Interest 2 2 0 0 2  Down, Depressed, Hopeless 3 2 0 0 1  PHQ - 2 Score 5 4 0 0 3  Altered sleeping 3 2 - - 3  Tired, decreased energy 2 3 - - 1  Change in appetite 0 0 - - 1  Feeling bad or failure about yourself  0 0 - - 1  Trouble concentrating 0 1 - - 2  Moving slowly or fidgety/restless 0 0 - - 2  Suicidal thoughts 0 0 - - 0  PHQ-9 Score 10 10 - - 13  Difficult doing work/chores Not difficult at all Somewhat difficult - - Somewhat difficult   Hypertension: BP Readings from Last 3 Encounters:  04/29/19 (!) 144/86  03/03/19 (!) 148/86  12/18/18 (!) 148/98   Obesity: Wt Readings from Last 3 Encounters:  04/29/19 264 lb 1.6 oz (119.8 kg)  03/03/19 263 lb 14.4 oz (119.7 kg)  12/18/18 260 lb (117.9 kg)   BMI Readings from Last 3 Encounters:  04/29/19  42.63 kg/m  03/03/19 42.59 kg/m  12/18/18 41.97 kg/m     Social History      She  reports that she has quit smoking. Her smoking use included cigarettes. She has never used smokeless tobacco. She reports previous alcohol use. She reports that she does not use drugs.       Social History   Socioeconomic History  . Marital status: Married    Spouse name: Quita Skye  . Number of children: 1  . Years of education: 74  . Highest education level: Some college, no degree  Occupational History  . Occupation: Probation officer, Sales executive  Social Needs  . Financial resource strain: Not hard at all  . Food insecurity    Worry: Never true    Inability: Never true  . Transportation needs    Medical: No    Non-medical: No  Tobacco Use  . Smoking status: Former Smoker    Types: Cigarettes  . Smokeless tobacco: Never Used  Substance and Sexual Activity  . Alcohol use: Not Currently  . Drug use: No  . Sexual activity: Yes  Lifestyle  . Physical activity    Days per week: 0 days    Minutes per session: 0 min  . Stress: Very Gomez  Relationships  . Social connections    Talks on phone: More than three times a week    Gets together: Three times a week    Attends religious service: Never    Active member of club or organization: No    Attends meetings of clubs or organizations: Never    Relationship status: Married  Other Topics Concern  . Not on  file  Social History Narrative   Born in [place] and has  Lived here since 38 years old.       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       Reads a lot.       Married.      50 year old daughter.        Family History        Family Status  Relation Name Status  . Mother  Alive  . Father  Alive  . [employer]  . MGM  Deceased       Suicide  . MGF  Deceased  . PGF  Deceased  . PGM  Deceased        Her family history includes Alcohol abuse in her mother; Cancer in her paternal aunt; Diabetes in her father, mother, paternal grandfather, and  paternal grandmother; Early death in her maternal grandmother; Hyperlipidemia in her mother; Hypertension in her mother; Mental illness in her maternal grandmother, mother, and paternal aunt; Stroke in her paternal grandfather.       Family History  Problem Relation Age of Onset  . Alcohol abuse Mother   . Hyperlipidemia Mother   . Hypertension Mother   . Mental illness Mother   . Diabetes Mother   . Diabetes Father   . Mental illness Paternal Aunt   . Cancer Paternal Aunt        lung  . Early death Maternal Grandmother   . Mental illness Maternal Grandmother   . Stroke Paternal Grandfather   . Diabetes Paternal Grandfather   . Diabetes Paternal Grandmother     Patient Active Problem List   Diagnosis Date Noted  . Irregular menses 05/04/2019  . Metabolic syndrome 56/38/7564  . Chronic idiopathic urticaria 05/04/2019  . Hypercholesterolemia 03/28/2018  . Morbid obesity with BMI of 40.0-44.9, adult ([place]) 09/05/2017  . History of diverticulitis 09/05/2017  . Incidental lung nodule 02/18/2017  . Allergic rhinitis 09/05/2016  . Fatty liver 06/10/2016  . Bipolar disorder ([place]) 03/05/2016  . Essential hypertension 03/05/2016  . Type 2 diabetes mellitus without complication, without long-term current use of insulin ([place]) 03/05/2016    Past Surgical History:  Procedure Laterality Date  . TONSILLECTOMY AND ADENOIDECTOMY       Current Outpatient Medications:  .  albuterol (VENTOLIN HFA) 108 (90 Base) MCG/ACT inhaler, Inhale 2 puffs into the lungs every 6 (six) hours as needed for wheezing or shortness of breath., Disp: 18 g, Rfl: 1 .  glipiZIDE (GLUCOTROL) 10 MG tablet, TAKE 1 TABLET BY MOUTH TWICE (2) DAILY BEFORE MEALS, hold if blood sugar <120 or if not eating, Disp: 60 tablet, Rfl: 1 .  lamoTRIgine (LAMICTAL) 100 MG tablet, Take 250 mg by mouth daily. , Disp: , Rfl:  .  lisinopril (ZESTRIL) 10 MG tablet, Take 1 tablet (10 mg total) by mouth daily., Disp: 90 tablet, Rfl: 3 .   loratadine (CLARITIN) 10 MG tablet, Take 10 mg by mouth daily., Disp: , Rfl:  .  metFORMIN (GLUCOPHAGE) 500 MG tablet, TAKE 2 TABLETS BY MOUTH TWICE A DAY WITHA MEAL., Disp: 360 tablet, Rfl: 3 .  QUEtiapine (SEROQUEL) 50 MG tablet, Take 50 mg by mouth as needed. , Disp: , Rfl:  .  atorvastatin (LIPITOR) 20 MG tablet, Take 1 tablet (20 mg total) by mouth at bedtime., Disp: 90 tablet, Rfl: 1 .  desloratadine (CLARINEX) 5 MG tablet, Take 1 tablet (5 mg total) by mouth daily., Disp: 30 tablet,  Rfl: 2 .  glucose blood test strip, Contour Next Test Strips, Disp: 100 each, Rfl: 11  Allergies  Allergen Reactions  . Amoxicillin Other (See Comments)    Chest pain   . Clindamycin Swelling  . Kale     Tongue swelling  . Oysters [Shellfish Allergy]     Dizzy, nauseated  . Ziprasidone Hcl Swelling  . Amoxicillin-Pot Clavulanate Rash    Patient Care Team: Delsa Grana, PA-C as PCP - General (Family Medicine)  Review of Systems  Constitutional: Negative.  Negative for activity change, appetite change, fatigue and unexpected weight change.  HENT: Negative.   Eyes: Negative.   Respiratory: Negative.  Negative for shortness of breath.   Cardiovascular: Negative.  Negative for chest pain, palpitations and leg swelling.  Gastrointestinal: Negative.  Negative for abdominal pain and blood in stool.  Endocrine: Negative.   Genitourinary: Negative.   Musculoskeletal: Negative.  Negative for arthralgias, gait problem, joint swelling and myalgias.  Skin: Negative.  Negative for color change, pallor and rash.  Allergic/Immunologic: Negative.   Neurological: Negative.  Negative for syncope and weakness.  Hematological: Negative.   Psychiatric/Behavioral: Negative.  Negative for confusion, dysphoric mood, self-injury and suicidal ideas. The patient is not nervous/anxious.   All other systems reviewed and are negative.   I personally reviewed active problem list, medication list, allergies, family history,  social history, health maintenance, notes from last encounter, lab results, imaging with the patient/caregiver today.        Objective:   Vitals:  Vitals:   04/29/19 1012  BP: (!) 144/86  Pulse: (!) 102  Resp: 14  Temp: 98.5 F (36.9 C)  SpO2: 98%  Weight: 264 lb 1.6 oz (119.8 kg)  Height: _0  (1.676 m)    Body mass index is 42.63 kg/m.  Physical Exam Vitals signs and nursing note reviewed.  Constitutional:      General: She is not in acute distress.    Appearance: Normal appearance. She is well-developed. She is obese. She is diaphoretic. She is not ill-appearing or toxic-appearing.  HENT:     Head: Normocephalic and atraumatic.     Right Ear: External ear normal.     Left Ear: External ear normal.     Nose: Nose normal.     Mouth/Throat:     Pharynx: Uvula midline.  Eyes:     General: Lids are normal.     Conjunctiva/sclera: Conjunctivae normal.     Pupils: Pupils are equal, round, and reactive to light.  Neck:     Musculoskeletal: Normal range of motion and neck supple.     Trachea: Phonation normal. No tracheal deviation.  Cardiovascular:     Rate and Rhythm: Normal rate and regular rhythm.     Pulses: Normal pulses.          Radial pulses are 2+ on the right side and 2+ on the left side.       Posterior tibial pulses are 2+ on the right side and 2+ on the left side.     Heart sounds: Normal heart sounds. No murmur. No friction rub. No gallop.   Pulmonary:     Effort: Pulmonary effort is normal. No respiratory distress.     Breath sounds: Normal breath sounds. No stridor. No wheezing, rhonchi or rales.  Chest:     Chest wall: No tenderness.     Breasts:        Right: Normal.        Left: Normal.  Abdominal:     General: Bowel sounds are normal. There is no distension.     Palpations: Abdomen is soft.     Tenderness: There is no abdominal tenderness. There is no guarding or rebound.  Musculoskeletal: Normal range of motion.        General: No  deformity.  Lymphadenopathy:     Cervical: No cervical adenopathy.     Upper Body:     Right upper body: No supraclavicular, axillary or pectoral adenopathy.     Left upper body: No supraclavicular, axillary or pectoral adenopathy.  Skin:    General: Skin is warm.     Capillary Refill: Capillary refill takes less than 2 seconds.     Coloration: Skin is not pale.     Findings: No rash.  Neurological:     Mental Status: She is alert and oriented to person, place, and time.     Motor: No abnormal muscle tone.     Gait: Gait normal.  Psychiatric:        Attention and Perception: Attention normal.        Mood and Affect: Mood is anxious. Mood is not depressed.        Speech: Speech normal.        Behavior: Behavior normal. Behavior is cooperative.        Thought Content: Thought content normal.        Cognition and Memory: Cognition normal.        Judgment: Judgment normal.       Fall Risk: Fall Risk  04/29/2019 03/03/2019 03/04/2017 11/14/2016 08/06/2016  Falls in the past year? 0 0 No No No  Number falls in past yr: 0 0 - - -  Injury with Fall? 0 0 - - -    Functional Status Survey: Is the patient deaf or have difficulty hearing?: No Does the patient have difficulty seeing, even when wearing glasses/contacts?: No Does the patient have difficulty concentrating, remembering, or making decisions?: No Does the patient have difficulty walking or climbing stairs?: No Does the patient have difficulty dressing or bathing?: No Does the patient have difficulty doing errands alone such as visiting a doctor's office or shopping?: No   Assessment & Plan:    CPE completed today  . USPSTF grade A and B recommendations reviewed with patient; age-appropriate recommendations, preventive care, screening tests, etc discussed and encouraged; healthy living encouraged; see AVS for patient education given to patient  . Discussed importance of 150 minutes of physical activity weekly, AHA exercise  recommendations given to pt in AVS/handout  . Discussed importance of healthy diet:  eating lean meats and proteins, avoiding trans fats and saturated fats, avoid simple sugars and excessive carbs in diet, eat 6 servings of fruit/vegetables daily and drink plenty of water and avoid sweet beverages.    . Recommended pt to do annual eye exam and routine dental exams/cleanings  . Depression, alcohol, fall screening completed as documented above and per flowsheets  . Reviewed Health Maintenance: Health Maintenance  Topic Date Due  . FOOT EXAM  03/29/2019  . HEMOGLOBIN A1C  06/11/2019  . OPHTHALMOLOGY EXAM  01/27/2020  . PAP SMEAR-Modifier  07/13/2020  . TETANUS/TDAP  03/11/2024  . INFLUENZA VACCINE  Completed  . PNEUMOCOCCAL POLYSACCHARIDE VACCINE AGE 70-64 HIGH RISK  Completed  . HIV Screening  Completed    . Immunizations: Immunization History  Administered Date(s) Administered  . Influenza Split 03/16/2015  . Influenza,inj,Quad PF,6+ Mos 03/05/2016, 03/04/2017, 03/11/2018, 03/03/2019  .  Pneumococcal Polysaccharide-23 03/28/2018  . Tdap 03/11/2014       ICD-10-CM   1. Adult general medical exam  Z00.00 TSH    Lipid Panel    CMP w GFR    CBC w/ Diff    A1C  2. Chronic idiopathic urticaria  L50.1    has seen specialist before, required high dose clarinex, currently OTC meds not helping and requesting clarinex again, worse with fall/winter weather  3. Type 2 diabetes mellitus without complication, without long-term current use of insulin ([facility])  E11.9 CMP w GFR    A1C  4. Essential hypertension  I10 CMP w GFR   Elevated here but patient is extremely anxious she will monitor at home and follow-up if over 130/80  5. Morbid obesity with BMI of 40.0-44.9, adult ([facility])  E66.01 TSH   Z68.41    Discussed different diabetes medications and also help with weight loss the patient is very hesitant to do new medications, she is working on lifestyle changes  6. Hypercholesterolemia  E78.00  Lipid Panel    CMP w GFR  7. Bipolar affective disorder, remission status unspecified ([place])  F31.9    Managed by specialist patient states her mood is good  8. Fatty liver  K76.0    History of fatty liver, she has had elevated LFTs and abdominal pain have been waiting for right upper quadrant ultrasound  9. Abdominal pain, unspecified abdominal location  R10.9 UA w/reflex microscopy, LAB    Urine Culture  10. Metabolic syndrome  T34.28 TSH    Lipid Panel    CMP w GFR    A1C   Suspect metabolic syndrome possibly PCOS, with increased abdominal obesity, elevated triglycerides, diabetes BMI 42  11. Irregular menses  N92.6    suspect PCOS?  pt not bothered by irregularity and does not want to get pregnant  12. Screening for endocrine, metabolic and immunity disorder  Z13.29 TSH   Z13.228    Z13.0   13. Encounter for medication monitoring  Z51.81   14. Dysuria  R30.0 UA w/reflex microscopy, LAB    Urine Culture   Recheck urine, she had urinary symptoms at her last visit but could not leave a sample  15. Flank pain  R10.9 UA w/reflex microscopy, LAB    Urine Culture   Unclear if she is having flank pain to some muscle skeletal pain from physical activity or urinary symptoms recheck her urine   For her diabetes discussed additional medications which she could add on to her regimen and as I do think the glipizide's are not the best medication and will lead to weight gain possibly hypoglycemia side effect I have given her lots of information about GLP-1 inhibitors today and Rybelsus as a possible oral option.  She is working diligently on her lifestyle changes with improved diet and trying to do some exercise.  She currently does not want to start any new medications and does not want a referral to medical weight management or diabetes or nutritional education referral  For her chronic idiopathic hives refilled medication she used to take with a specialist have encouraged her to take other  antihistamines over-the-counter she can add Pepcid and hydroxyzine as needed for worsening hives.  We also discussed Singulair as an option    Delsa Grana, PA-C 04/29/19 10:40 AM  [facility]

## 2019-05-01 LAB — CBC WITH DIFFERENTIAL/PLATELET
Absolute Monocytes: 540 cells/uL (ref 200–950)
Basophils Absolute: 81 cells/uL (ref 0–200)
Basophils Relative: 1.1 %
Eosinophils Absolute: 318 cells/uL (ref 15–500)
Eosinophils Relative: 4.3 %
HCT: 37.6 % (ref 35.0–45.0)
Hemoglobin: 12.6 g/dL (ref 11.7–15.5)
Lymphs Abs: 2020 cells/uL (ref 850–3900)
MCH: 27.4 pg (ref 27.0–33.0)
MCHC: 33.5 g/dL (ref 32.0–36.0)
MCV: 81.7 fL (ref 80.0–100.0)
MPV: 9.5 fL (ref 7.5–12.5)
Monocytes Relative: 7.3 %
Neutro Abs: 4440 cells/uL (ref 1500–7800)
Neutrophils Relative %: 60 %
Platelets: 285 10*3/uL (ref 140–400)
RBC: 4.6 10*6/uL (ref 3.80–5.10)
RDW: 13.4 % (ref 11.0–15.0)
Total Lymphocyte: 27.3 %
WBC: 7.4 10*3/uL (ref 3.8–10.8)

## 2019-05-01 LAB — LIPID PANEL
Cholesterol: 224 mg/dL — ABNORMAL HIGH (ref <200)
HDL: 41 mg/dL — ABNORMAL LOW (ref >=50)
LDL Cholesterol (Calc): 151 mg/dL (calc) — ABNORMAL HIGH
Non-HDL Cholesterol (Calc): 183 mg/dL (calc) — ABNORMAL HIGH (ref <130)
Total CHOL/HDL Ratio: 5.5 (calc) — ABNORMAL HIGH (ref <5.0)
Triglycerides: 183 mg/dL — ABNORMAL HIGH (ref <150)

## 2019-05-01 LAB — URINE CULTURE
MICRO NUMBER:: 1140100
Result:: NO GROWTH
SPECIMEN QUALITY:: ADEQUATE

## 2019-05-01 LAB — COMPLETE METABOLIC PANEL WITH GFR
AG Ratio: 1.5 (calc) (ref 1.0–2.5)
ALT: 37 U/L — ABNORMAL HIGH (ref 6–29)
AST: 28 U/L (ref 10–30)
Albumin: 4.2 g/dL (ref 3.6–5.1)
Alkaline phosphatase (APISO): 48 U/L (ref 31–125)
BUN: 8 mg/dL (ref 7–25)
CO2: 26 mmol/L (ref 20–32)
Calcium: 9.8 mg/dL (ref 8.6–10.2)
Chloride: 102 mmol/L (ref 98–110)
Creat: 0.71 mg/dL (ref 0.50–1.10)
GFR, Est African American: 127 mL/min/{1.73_m2} (ref >=60)
GFR, Est Non African American: 110 mL/min/{1.73_m2} (ref >=60)
Globulin: 2.8 g/dL (calc) (ref 1.9–3.7)
Glucose, Bld: 183 mg/dL — ABNORMAL HIGH (ref 65–99)
Potassium: 4.9 mmol/L (ref 3.5–5.3)
Sodium: 137 mmol/L (ref 135–146)
Total Bilirubin: 0.4 mg/dL (ref 0.2–1.2)
Total Protein: 7 g/dL (ref 6.1–8.1)

## 2019-05-01 LAB — URINALYSIS, ROUTINE W REFLEX MICROSCOPIC
Bilirubin Urine: NEGATIVE
Glucose, UA: NEGATIVE
Hgb urine dipstick: NEGATIVE
Leukocytes,Ua: NEGATIVE
Nitrite: NEGATIVE
Protein, ur: NEGATIVE
Specific Gravity, Urine: 1.017 (ref 1.001–1.03)
pH: 5.5 (ref 5.0–8.0)

## 2019-05-01 LAB — HEMOGLOBIN A1C
Hgb A1c MFr Bld: 8 % of total Hgb — ABNORMAL HIGH (ref <5.7)
Mean Plasma Glucose: 183 (calc)
eAG (mmol/L): 10.1 (calc)

## 2019-05-01 LAB — TSH: TSH: 1.62 mIU/L

## 2019-05-04 ENCOUNTER — Other Ambulatory Visit: Payer: Self-pay | Admitting: Family Medicine

## 2019-05-04 ENCOUNTER — Encounter: Payer: Self-pay | Admitting: Family Medicine

## 2019-05-04 DIAGNOSIS — E8881 Metabolic syndrome: Secondary | ICD-10-CM | POA: Insufficient documentation

## 2019-05-04 DIAGNOSIS — L501 Idiopathic urticaria: Secondary | ICD-10-CM | POA: Insufficient documentation

## 2019-05-04 DIAGNOSIS — N926 Irregular menstruation, unspecified: Secondary | ICD-10-CM | POA: Insufficient documentation

## 2019-05-04 MED ORDER — ATORVASTATIN CALCIUM 20 MG PO TABS: 20.0000 mg | ORAL_TABLET | Freq: Every day | ORAL | 1 refills | Status: DC

## 2019-05-06 ENCOUNTER — Encounter: Payer: Self-pay | Admitting: Family Medicine

## 2019-05-12 ENCOUNTER — Ambulatory Visit: Payer: BLUE CROSS/BLUE SHIELD

## 2019-05-18 ENCOUNTER — Encounter: Payer: Self-pay | Admitting: Family Medicine

## 2019-05-19 MED ORDER — AZELASTINE HCL 0.1 % NA SOLN: 2.0000 | Freq: Two times a day (BID) | NASAL | 12 refills | Status: DC

## 2019-06-02 ENCOUNTER — Ambulatory Visit: Payer: BLUE CROSS/BLUE SHIELD | Admitting: Family Medicine

## 2019-06-17 ENCOUNTER — Encounter: Payer: Self-pay | Admitting: Family Medicine

## 2019-06-17 ENCOUNTER — Ambulatory Visit: Payer: Self-pay | Admitting: Family Medicine

## 2019-06-17 ENCOUNTER — Ambulatory Visit: Payer: BLUE CROSS/BLUE SHIELD | Admitting: Family Medicine

## 2019-06-17 ENCOUNTER — Other Ambulatory Visit: Payer: Self-pay

## 2019-06-17 VITALS — BP 124/84 | HR 99 | Temp 97.3°F | Resp 14 | Ht 66.0 in | Wt 252.5 lb

## 2019-06-17 DIAGNOSIS — R1032 Left lower quadrant pain: Secondary | ICD-10-CM | POA: Diagnosis not present

## 2019-06-17 DIAGNOSIS — K5792 Diverticulitis of intestine, part unspecified, without perforation or abscess without bleeding: Secondary | ICD-10-CM | POA: Diagnosis not present

## 2019-06-17 MED ORDER — CIPROFLOXACIN HCL 500 MG PO TABS: 500.0000 mg | ORAL_TABLET | Freq: Two times a day (BID) | ORAL | 0 refills | Status: DC

## 2019-06-17 MED ORDER — METRONIDAZOLE 500 MG PO TABS: 500.0000 mg | ORAL_TABLET | Freq: Three times a day (TID) | ORAL | 0 refills | Status: AC

## 2019-06-17 MED ORDER — ONDANSETRON 4 MG PO TBDP: 4.0000 mg | ORAL_TABLET | Freq: Three times a day (TID) | ORAL | 1 refills | Status: DC | PRN

## 2019-06-17 NOTE — Telephone Encounter (Signed)
Please place her on schedule at 11:40

## 2019-06-17 NOTE — Patient Instructions (Signed)
Take meds for the next 10 days, push fluids initially and take nausea meds  Follow up if not improving or if any meds intolerance  Diverticulitis  Diverticulitis is when small pockets in your large intestine (colon) get infected or swollen. This causes stomach pain and watery poop (diarrhea). These pouches are called diverticula. They form in people who have a condition called diverticulosis. Follow these instructions at home: Medicines  Take over-the-counter and prescription medicines only as told by your doctor. These include: ? Antibiotics. ? Pain medicines. ? Fiber pills. ? Probiotics. ? Stool softeners.  Do not drive or use heavy machinery while taking prescription pain medicine.  If you were prescribed an antibiotic, take it as told. Do not stop taking it even if you feel better. General instructions   Follow a diet as told by your doctor.  When you feel better, your doctor may tell you to change your diet. You may need to eat a lot of fiber. Fiber makes it easier to poop (have bowel movements). Healthy foods with fiber include: ? Berries. ? Beans. ? Lentils. ? Green vegetables.  Exercise 3 or more times a week. Aim for 30 minutes each time. Exercise enough to sweat and make your heart beat faster.  Keep all follow-up visits as told. This is important. You may need to have an exam of the large intestine. This is called a colonoscopy. Contact a doctor if:  Your pain does not get better.  You have a hard time eating or drinking.  You are not pooping like normal. Get help right away if:  Your pain gets worse.  Your problems do not get better.  Your problems get worse very fast.  You have a fever.  You throw up (vomit) more than one time.  You have poop that is: ? Bloody. ? Black. ? Tarry. Summary  Diverticulitis is when small pockets in your large intestine (colon) get infected or swollen.  Take medicines only as told by your doctor.  Follow a diet  as told by your doctor. This information is not intended to replace advice given to you by your health care provider. Make sure you discuss any questions you have with your health care provider. Document Revised: 05/03/2017 Document Reviewed: 06/07/2016 Elsevier Patient Education  [place].

## 2019-06-17 NOTE — Progress Notes (Signed)
Patient ID: Caitlin Gomez, female    DOB: January 13, 1981, 39 y.o.   MRN: [id number]  PCP: Delsa Grana, PA-C  Chief Complaint  Patient presents with  . Abdominal Pain    oneset last night  . Diverticulitis    Subjective:   Caitlin Gomez is a 39 y.o. female, presents to clinic with CC of the following:  HPI  Gradual onset yesterday evening of left sided and LLQ abdominal pain, sharp and crampy, constant pain since onset, initially more severe last night rated 7/10, and mildly improved today, currently 4/10.  Pain is nonradiating, associated with feeling constipated, but she states she did have a bowel movement yesterday and she is not having any firm or compact stools and not having to strain or push.  She has a history of diverticulitis she has had multiple episodes and CTs in the past she states at least 2 of them which confirmed diverticulitis.  It is not as severe as past episodes.  She denies any associated fever chills sweats nausea vomiting diarrhea, denies any melena or hematochezia.  She denies any associated urinary or vaginal symptoms.  She does have decreased appetite just does not feel like eating right now.      Patient Active Problem List   Diagnosis Date Noted  . Irregular menses 05/04/2019  . Metabolic syndrome 99991111  . Chronic idiopathic urticaria 05/04/2019  . Hypercholesterolemia 03/28/2018  . Morbid obesity with BMI of 40.0-44.9, adult ([place]) 09/05/2017  . History of diverticulitis 09/05/2017  . Incidental lung nodule 02/18/2017  . Allergic rhinitis 09/05/2016  . Fatty liver 06/10/2016  . Bipolar disorder ([place]) 03/05/2016  . Essential hypertension 03/05/2016  . Type 2 diabetes mellitus without complication, without long-term current use of insulin ([place]) 03/05/2016      Current Outpatient Medications:  .  albuterol (VENTOLIN HFA) 108 (90 Base) MCG/ACT inhaler, Inhale 2 puffs into the lungs every 6 (six) hours as needed for wheezing or shortness of  breath., Disp: 18 g, Rfl: 1 .  azelastine (ASTELIN) 0.1 % nasal spray, Place 2 sprays into both nostrils 2 (two) times daily. Use in each nostril as directed, Disp: 30 mL, Rfl: 12 .  glipiZIDE (GLUCOTROL) 10 MG tablet, TAKE 1 TABLET BY MOUTH TWICE (2) DAILY BEFORE MEALS, hold if blood sugar <120 or if not eating, Disp: 60 tablet, Rfl: 1 .  glucose blood test strip, Contour Next Test Strips, Disp: 100 each, Rfl: 11 .  lamoTRIgine (LAMICTAL) 100 MG tablet, Take 250 mg by mouth daily. , Disp: , Rfl:  .  lisinopril (ZESTRIL) 10 MG tablet, Take 1 tablet (10 mg total) by mouth daily., Disp: 90 tablet, Rfl: 3 .  loratadine (CLARITIN) 10 MG tablet, Take 10 mg by mouth daily., Disp: , Rfl:  .  metFORMIN (GLUCOPHAGE) 500 MG tablet, TAKE 2 TABLETS BY MOUTH TWICE A DAY WITHA MEAL., Disp: 360 tablet, Rfl: 3 .  QUEtiapine (SEROQUEL) 50 MG tablet, Take 50 mg by mouth as needed. , Disp: , Rfl:  .  atorvastatin (LIPITOR) 20 MG tablet, Take 1 tablet (20 mg total) by mouth at bedtime. (Patient not taking: Reported on 06/17/2019), Disp: 90 tablet, Rfl: 1 .  desloratadine (CLARINEX) 5 MG tablet, Take 1 tablet (5 mg total) by mouth daily. (Patient not taking: Reported on 06/17/2019), Disp: 30 tablet, Rfl: 2   Allergies  Allergen Reactions  . Amoxicillin Other (See Comments)    Chest pain   . Clindamycin Swelling  . Irene Lim[place]  Tongue swelling  . Oysters [Shellfish Allergy]     Dizzy, nauseated  . Ziprasidone Hcl Swelling  . Amoxicillin-Pot Clavulanate Rash     Family History  Problem Relation Age of Onset  . Alcohol abuse Mother   . Hyperlipidemia Mother   . Hypertension Mother   . Mental illness Mother   . Diabetes Mother   . Diabetes Father   . Mental illness Paternal Aunt   . Cancer Paternal Aunt        lung  . Early death Maternal Grandmother   . Mental illness Maternal Grandmother   . Stroke Paternal Grandfather   . Diabetes Paternal Grandfather   . Diabetes Paternal Grandmother      Social  History   Socioeconomic History  . Marital status: Married    Spouse name: Quita Skye  . Number of children: 1  . Years of education: 68  . Highest education level: Some college, no degree  Occupational History  . Occupation: Probation officer, Sales executive  Tobacco Use  . Smoking status: Former Smoker    Types: Cigarettes  . Smokeless tobacco: Never Used  Substance and Sexual Activity  . Alcohol use: Not Currently  . Drug use: No  . Sexual activity: Yes  Other Topics Concern  . Not on file  Social History Narrative   Born in [place] and has  Lived here since 39 years old.       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       Reads a lot.       Married.      68 year old daughter.       Social Determinants of Health   Financial Resource Strain: Low Risk   . Difficulty of Paying Living Expenses: Not hard at all  Food Insecurity: No Food Insecurity  . Worried About Charity fundraiser in the Last Year: Never true  . Ran Out of Food in the Last Year: Never true  Transportation Needs: No Transportation Needs  . Lack of Transportation (Medical): No  . Lack of Transportation (Non-Medical): No  Physical Activity: Inactive  . Days of Exercise per Week: 0 days  . Minutes of Exercise per Session: 0 min  Stress: Stress Concern Present  . Feeling of Stress : Very much  Social Connections: Somewhat Isolated  . Frequency of Communication with Friends and Family: More than three times a week  . Frequency of Social Gatherings with Friends and Family: Three times a week  . Attends Religious Services: Never  . Active Member of Clubs or Organizations: No  . Attends Archivist Meetings: Never  . Marital Status: Married  Human resources officer Violence: Not At Risk  . Fear of Current or Ex-Partner: No  . Emotionally Abused: No  . Physically Abused: No  . Sexually Abused: No    Chart Review Today: I personally reviewed active problem list, medication list, allergies, family history, social history, health  maintenance, notes from last encounter, lab results, imaging with the patient/caregiver today.   Review of Systems  Constitutional: Negative.   HENT: Negative.   Eyes: Negative.   Respiratory: Negative.   Cardiovascular: Negative.   Gastrointestinal: Negative.   Endocrine: Negative.   Genitourinary: Negative.   Musculoskeletal: Negative.   Skin: Negative.   Allergic/Immunologic: Negative.   Neurological: Negative.   Hematological: Negative.   Psychiatric/Behavioral: Negative.   All other systems reviewed and are negative.      Objective:   Vitals:   06/17/19 1151  BP:  124/84  Pulse: 99  Resp: 14  Temp: (!) 97.3 F (36.3 C)  TempSrc: Temporal  SpO2: 98%  Weight: 252 lb 8 oz (114.5 kg)  Height: 5\' 6"  (1.676 m)    Body mass index is 40.75 kg/m.  Physical Exam Vitals and nursing note reviewed.  Constitutional:      General: She is not in acute distress.    Appearance: Normal appearance. She is well-developed. She is obese. She is not ill-appearing, toxic-appearing or diaphoretic.     Interventions: Face mask in place.  HENT:     Head: Normocephalic and atraumatic.     Right Ear: External ear normal.     Left Ear: External ear normal.  Eyes:     General: Lids are normal. No scleral icterus.       Right eye: No discharge.        Left eye: No discharge.     Conjunctiva/sclera: Conjunctivae normal.  Neck:     Trachea: Phonation normal. No tracheal deviation.  Cardiovascular:     Rate and Rhythm: Normal rate and regular rhythm.     Pulses: Normal pulses.          Radial pulses are 2+ on the right side and 2+ on the left side.       Posterior tibial pulses are 2+ on the right side and 2+ on the left side.     Heart sounds: Normal heart sounds. No murmur. No friction rub. No gallop.   Pulmonary:     Effort: Pulmonary effort is normal. No respiratory distress.     Breath sounds: Normal breath sounds. No stridor. No wheezing, rhonchi or rales.  Chest:     Chest  wall: No tenderness.  Abdominal:     General: Bowel sounds are decreased. There is no distension.     Palpations: Abdomen is soft.     Tenderness: There is no guarding or rebound.     Hernia: No hernia is present.       Comments: Obese abdomen, soft, non-distended, ttp to left mid anterior abd to LLQ (area circled) with voluntary guarding to deep palpation, no rebound tenderness, no masses palpated, no tenderness to palpation to left upper quadrant epigastrium, right upper quadrant suprapubic area or right lower quadrant, negative McBurney sign and negative Murphy sign No CVA tenderness bilaterally   Musculoskeletal:        General: No deformity. Normal range of motion.     Cervical back: Normal range of motion and neck supple.     Right lower leg: No edema.     Left lower leg: No edema.  Lymphadenopathy:     Cervical: No cervical adenopathy.  Skin:    General: Skin is warm and dry.     Capillary Refill: Capillary refill takes less than 2 seconds.     Coloration: Skin is not jaundiced or pale.     Findings: No rash.  Neurological:     Mental Status: She is alert and oriented to person, place, and time.     Motor: No abnormal muscle tone.     Gait: Gait normal.  Psychiatric:        Speech: Speech normal.        Behavior: Behavior normal.            Assessment & Plan:      ICD-10-CM   1. Left lower quadrant abdominal pain  R10.32 metroNIDAZOLE (FLAGYL) 500 MG tablet    ciprofloxacin (CIPRO) 500 MG  tablet    CANCELED: CBC w/ Diff    CANCELED: CMP w GFR    CANCELED: Lipase    CANCELED: Urine Culture    CANCELED: POCT urinalysis dipstick  2. Diverticulitis  K57.92 metroNIDAZOLE (FLAGYL) 500 MG tablet    ciprofloxacin (CIPRO) 500 MG tablet    Focal abdominal pain with tenderness to palpation to left mid/side to llq area, no masses, no rebound ttp or peritoneal signs -suspicious for diverticulitis to left descending colon, no low pelvic or suprapubic tenderness, absence of  urinary or vaginal symptoms.  Patient again cannot produce a urine sample and she is very adamant that it is consistent with her past episodes of diverticulitis.  Will cancel labs ordered and treat with presumed diverticulitis with Cipro and Flagyl, Zofran for nausea and for any difficulty with taking medications.  Encouraged her to push fluids, advance diet very slowly, follow-up if any worsening symptoms or if not resolving.  Red flags and indication for follow-up were reviewed with the patient and she was given a handout.  Delsa Grana, PA-C 06/17/19 12:02 PM

## 2019-06-17 NOTE — Telephone Encounter (Signed)
Yes please get the pt in today- she has had several months of abdominal pain and she has not completed to imaging - will want in office visit so I can examine her and hopefully get needed labs and imaging done today   -LT

## 2019-06-17 NOTE — Telephone Encounter (Signed)
Message from Scherrie Gerlach sent at 06/17/2019 8:10 AM EST  Pt states she has lower left side abdominal pain that she associates with diverticulitis. This started last night. Pt states she has had several times in the past. Probably a year since last one.  No appts at [facility]. Please advise    Pt advised to go to UC since there are no MD appts until Friday. Pt denies need to go to UC and states she will wait until Friday. Pt cautioned if condition changes, UC is her best option. Pt states she will go to UC if it becomes necessary.  Reason for Disposition . [1] MODERATE pain (e.g., interferes with normal activities) AND [2] pain comes and goes (cramps) AND [3] present > 24 hours  (Exception: pain with Vomiting or Diarrhea - see that Guideline)  Answer Assessment - Initial Assessment Questions 1. LOCATION: "Where does it hurt?"      Left lower 2. RADIATION: "Does the pain shoot anywhere else?" (e.g., chest, back)     Stays in place 3. ONSET: "When did the pain begin?" (e.g., minutes, hours or days ago)      Last evening 4. SUDDEN: "Gradual or sudden onset?"     gradually 5. PATTERN "Does the pain come and go, or is it constant?"    - If constant: "Is it getting better, staying the same, or worsening?"      (Note: Constant means the pain never goes away completely; most serious pain is constant and it progresses)     - If intermittent: "How long does it last?" "Do you have pain now?"     (Note: Intermittent means the pain goes away completely between bouts)     Comes and goes  6. SEVERITY: "How bad is the pain?"  (e.g., Scale 1-10; mild, moderate, or severe)   - MILD (1-3): doesn't interfere with normal activities, abdomen soft and not tender to touch    - MODERATE (4-7): interferes with normal activities or awakens from sleep, tender to touch    - SEVERE (8-10): excruciating pain, doubled over, unable to do any normal activities      7/10 7. RECURRENT SYMPTOM: "Have you ever had  this type of abdominal pain before?" If so, ask: "When was the last time?" and "What happened that time?"      One year 8. CAUSE: "What do you think is causing the abdominal pain?"     diverticulitis 9. RELIEVING/AGGRAVATING FACTORS: "What makes it better or worse?" (e.g., movement, antacids, bowel movement)     antibiotics 10. OTHER SYMPTOMS: "Has there been any vomiting, diarrhea, constipation, or urine problems?"       no 11. PREGNANCY: "Is there any chance you are pregnant?" "When was your last menstrual period?"       No, LMP two weeks ago  Protocols used: ABDOMINAL PAIN - [facility]

## 2019-06-17 NOTE — Telephone Encounter (Signed)
This encounter was created in error - please disregard.

## 2019-06-19 ENCOUNTER — Ambulatory Visit: Payer: BLUE CROSS/BLUE SHIELD | Admitting: Family Medicine

## 2019-06-23 ENCOUNTER — Encounter: Payer: Self-pay | Admitting: Family Medicine

## 2019-06-23 MED ORDER — FLUCONAZOLE 150 MG PO TABS: 150.0000 mg | ORAL_TABLET | ORAL | 0 refills | Status: DC | PRN

## 2019-06-24 ENCOUNTER — Ambulatory Visit: Payer: BLUE CROSS/BLUE SHIELD | Admitting: Family Medicine

## 2019-06-30 ENCOUNTER — Telehealth: Payer: Self-pay

## 2019-06-30 NOTE — Telephone Encounter (Signed)
Copied from [place] [id number]. Topic: General - Other >> Jun 30, 2019  8:30 AM Leward Quan A wrote: Reason for CRM: patient called to inform Kristeen Miss that she may be having a flare up from Diverticulitis with pain in her left side. Appointment scheduled for tomorrow.

## 2019-07-01 ENCOUNTER — Encounter: Payer: Self-pay | Admitting: Family Medicine

## 2019-07-01 ENCOUNTER — Telehealth: Payer: Self-pay | Admitting: Family Medicine

## 2019-07-01 ENCOUNTER — Ambulatory Visit (INDEPENDENT_AMBULATORY_CARE_PROVIDER_SITE_OTHER): Payer: BC Managed Care – PPO | Admitting: Family Medicine

## 2019-07-01 VITALS — Temp 97.1°F | Ht 66.5 in | Wt 249.0 lb

## 2019-07-01 DIAGNOSIS — R1032 Left lower quadrant pain: Secondary | ICD-10-CM

## 2019-07-01 DIAGNOSIS — K5909 Other constipation: Secondary | ICD-10-CM

## 2019-07-01 MED ORDER — POLYETHYLENE GLYCOL 3350 17 GM/SCOOP PO POWD: 17.0000 g | Freq: Two times a day (BID) | ORAL | 1 refills | Status: DC | PRN

## 2019-07-01 MED ORDER — METOCLOPRAMIDE HCL 5 MG PO TABS: 5.0000 mg | ORAL_TABLET | Freq: Three times a day (TID) | ORAL | 1 refills | Status: DC | PRN

## 2019-07-01 MED ORDER — AMOXICILLIN-POT CLAVULANATE ER 1000-62.5 MG PO TB12: 2.0000 | ORAL_TABLET | Freq: Two times a day (BID) | ORAL | 0 refills | Status: DC

## 2019-07-01 MED ORDER — AMOXICILLIN-POT CLAVULANATE 875-125 MG PO TABS: 1.0000 | ORAL_TABLET | Freq: Three times a day (TID) | ORAL | 0 refills | Status: AC

## 2019-07-01 MED ORDER — AMOXICILLIN-POT CLAVULANATE 875-125 MG PO TABS: 1.0000 | ORAL_TABLET | Freq: Three times a day (TID) | ORAL | 0 refills | Status: DC

## 2019-07-01 NOTE — Telephone Encounter (Signed)
Pt stated [facility] has augmentin on backorder and she is allergic to it. Requesting Bactrim or a callback from [facility].

## 2019-07-01 NOTE — Telephone Encounter (Signed)
Copied from [place] [id number]. Topic: General - Other >> Jul 01, 2019  2:23 PM Keene Breath wrote: Reason for CRM: Called to get clarification on patient's medication for amoxicillin-clavulanate (AUGMENTIN XR) 1000-62.5 MG 12 hr tablet.  Patient has an allergy to this medication.  Please advise and call to discuss at [phone]

## 2019-07-01 NOTE — Telephone Encounter (Signed)
I already resent to [facility]

## 2019-07-01 NOTE — Telephone Encounter (Signed)
Pharmacy called stating that they do not have Augmentin in stock and is requesting to have an alternative sent in. Please advise.     [address]  [place] [address]  Phone: [phone] Fax: [phone]  Not a 24 hour pharmacy; exact hours not known.

## 2019-07-01 NOTE — Progress Notes (Addendum)
Name: Caitlin Gomez   MRN: [id number]    DOB: 1981-01-20   Date:07/01/2019       Progress Note  Subjective:    Chief Complaint  Chief Complaint  Patient presents with  . Abdominal Pain    SIDE PAIN onset 2 weeks ago, diverticulitis, pills made her sick, pain went away and came back     I connected with  Caitlin Gomez  on 07/01/19 at  8:20 AM EST by a video enabled telemedicine application and verified that I am speaking with the correct person using two identifiers.  I discussed the limitations of evaluation and management by telemedicine and the availability of in person appointments. The patient expressed understanding and agreed to proceed. Staff also discussed with the patient that there may be a patient responsible charge related to this service. Patient Location: home Provider Location: [facility] Additional Individuals present: none  Abdominal Pain Associated symptoms include constipation. Pertinent negatives include no fever, hematuria or vomiting.   Patient presents for recurrent left lower abdominal pain consistent with past episodes of diverticulitis.  She had this recently about 2 weeks ago was treated with antibiotics had improvement of pain, she had difficulty taking her medications due to nausea.  Pain did improve and resolve, was not able to take abx as prescribed   but then returned. She also has hx of uncontrolled DM, gastroparesis chronic constipation, suspect   Patient Active Problem List   Diagnosis Date Noted  . Irregular menses 05/04/2019  . Metabolic syndrome 99991111  . Chronic idiopathic urticaria 05/04/2019  . Hypercholesterolemia 03/28/2018  . Morbid obesity with BMI of 40.0-44.9, adult ([place]) 09/05/2017  . History of diverticulitis 09/05/2017  . Incidental lung nodule 02/18/2017  . Allergic rhinitis 09/05/2016  . Fatty liver 06/10/2016  . Bipolar disorder ([place]) 03/05/2016  . Essential hypertension 03/05/2016  . Type 2 diabetes mellitus without  complication, without long-term current use of insulin ([place]) 03/05/2016    Social History   Tobacco Use  . Smoking status: Former Smoker    Types: Cigarettes  . Smokeless tobacco: Never Used  Substance Use Topics  . Alcohol use: Not Currently     Current Outpatient Medications:  .  albuterol (VENTOLIN HFA) 108 (90 Base) MCG/ACT inhaler, Inhale 2 puffs into the lungs every 6 (six) hours as needed for wheezing or shortness of breath., Disp: 18 g, Rfl: 1 .  azelastine (ASTELIN) 0.1 % nasal spray, Place 2 sprays into both nostrils 2 (two) times daily. Use in each nostril as directed, Disp: 30 mL, Rfl: 12 .  desloratadine (CLARINEX) 5 MG tablet, Take 1 tablet (5 mg total) by mouth daily., Disp: 30 tablet, Rfl: 2 .  glipiZIDE (GLUCOTROL) 10 MG tablet, TAKE 1 TABLET BY MOUTH TWICE (2) DAILY BEFORE MEALS, hold if blood sugar <120 or if not eating (Patient taking differently: TAKE 1 TABLET BY MOUTHDAILY BEFORE MEALS, hold if blood sugar <120 or if not eating), Disp: 60 tablet, Rfl: 1 .  glucose blood test strip, Contour Next Test Strips, Disp: 100 each, Rfl: 11 .  lamoTRIgine (LAMICTAL) 100 MG tablet, Take 250 mg by mouth daily. , Disp: , Rfl:  .  lisinopril (ZESTRIL) 10 MG tablet, Take 1 tablet (10 mg total) by mouth daily., Disp: 90 tablet, Rfl: 3 .  loratadine (CLARITIN) 10 MG tablet, Take 10 mg by mouth daily., Disp: , Rfl:  .  metFORMIN (GLUCOPHAGE) 500 MG tablet, TAKE 2 TABLETS BY MOUTH TWICE A DAY WITHA MEAL.,  Disp: 360 tablet, Rfl: 3 .  QUEtiapine (SEROQUEL) 50 MG tablet, Take 50 mg by mouth as needed. , Disp: , Rfl:  .  atorvastatin (LIPITOR) 20 MG tablet, Take 1 tablet (20 mg total) by mouth at bedtime. (Patient not taking: Reported on 06/17/2019), Disp: 90 tablet, Rfl: 1  Allergies  Allergen Reactions  . Amoxicillin Other (See Comments)    Chest pain   . Clindamycin Swelling  . Kale     Tongue swelling  . Oysters [Shellfish Allergy]     Dizzy, nauseated  . Ziprasidone Hcl  Swelling  . Amoxicillin-Pot Clavulanate Rash    I personally reviewed active problem list, medication list, allergies, family history, social history, health maintenance, notes from last encounter, lab results, imaging with the patient/caregiver today.   Review of Systems  Constitutional: Negative.  Negative for activity change, appetite change, chills, diaphoresis, fatigue and fever.  HENT: Negative.   Eyes: Negative.   Respiratory: Negative.   Cardiovascular: Negative.   Gastrointestinal: Positive for abdominal pain and constipation. Negative for abdominal distention and vomiting.  Endocrine: Negative.   Genitourinary: Negative.  Negative for decreased urine volume, hematuria and urgency.  Musculoskeletal: Negative.  Negative for back pain.  Skin: Negative.   Allergic/Immunologic: Negative.   Neurological: Negative.   Hematological: Negative.   Psychiatric/Behavioral: Negative.   All other systems reviewed and are negative.     Objective:   Virtual encounter, vitals limited, only able to obtain the following Today's Vitals   07/01/19 0758 07/01/19 0759  Temp: (!) 97.1 F (36.2 C)   TempSrc: Temporal   Weight: 249 lb (112.9 kg)   Height: 5' 6.5" (1.689 m)   PainSc:  3    Body mass index is 39.59 kg/m. Nursing Note and Vital Signs reviewed.  Physical Exam Vitals and nursing note reviewed.  Constitutional:      General: She is not in acute distress.    Appearance: Normal appearance. She is well-developed. She is obese. She is not ill-appearing, toxic-appearing or diaphoretic.  HENT:     Head: Normocephalic and atraumatic.  Eyes:     General:        Right eye: No discharge.        Left eye: No discharge.     Conjunctiva/sclera: Conjunctivae normal.  Neck:     Trachea: No tracheal deviation.  Cardiovascular:     Rate and Rhythm: Normal rate.  Pulmonary:     Effort: Pulmonary effort is normal. No respiratory distress.     Breath sounds: No stridor.  Skin:     Coloration: Skin is not jaundiced or pale.     Findings: No rash.  Neurological:     Mental Status: She is alert.  Psychiatric:        Mood and Affect: Mood normal.        Behavior: Behavior normal.     PE limited by telephone encounter  No results found for this or any previous visit (from the past 72 hour(s)).  Assessment and Plan:   1. Left lower quadrant abdominal pain Suspected recurrent diverticulitis, pain mild to moderate, focal to LLQ, same as past diverticulitis, did not take abx as prescribed 2 weeks ago, she did improve completely but pain starting to return again since yesterday. She cannot tolerate flagyl, she did okay with cipro but still was only taking one abx dose a day Reviewed all the medication options which are indicated for diverticulitis, all of them have Flagyl and then another antibiotic and  patient has a allergy to amoxicillin and Augmentin on the chart.  When we reviewed this today she states that she got chest pain with 1 of those antibiotics because it was a capsule that she feels like it stuck and that causes her to have chest pain she is not sure if it was anxiety or sensation of the pill but she did not have any respiratory distress, stridor, wheeze swelling of her lips tongue airway and no rash on her face.  She believes she had some kind of rash reaction when she was a small child but did not have any rash as an adult taking amoxicillin products. She would rather try amoxicillin, is willing to try it if is it is a tablet or liquid, discussed careful monitoring of any side effects to medications. Patient will need close follow-up if her pain does not resolve she may need GI or a CT concern for worsening infection perforation abscess with recurrent diverticulitis with poor med compliance Unable to do abdominal exam with virtual encounter with patient is well-appearing is afebrile has no diarrhea, bloody stools, vomiting no fever Reglan given to help with any  nausea medication side effects - amoxicillin-clavulanate (AUGMENTIN XR) 1000-62.5 MG 12 hr tablet; Take 2 tablets by mouth 2 (two) times daily.  Dispense: 20 tablet; Refill: 0 - metoCLOPramide (REGLAN) 5 MG tablet; Take 1-2 tablets (5-10 mg total) by mouth 3 (three) times daily as needed for nausea (bloating).  Dispense: 30 tablet; Refill: 1  2. Constipation, chronic She has history of constipation and some gastroparesis, she does not drink water or liquids hardly at all throughout the day may be less than 8 ounces we have discussed this previously and over the past couple months she started to do a keto diet which has made her constipation worse.  I have encouraged her to use stool softeners, encouraged her to add vegetables to her diet.  She states that she only started to use Metamucil as of yesterday and she has not tried any other medications that have suggested in the past.  Strongly encouraged that she push fluids, take MiraLAX and/or Colace to soften stools. - polyethylene glycol powder (GLYCOLAX/MIRALAX) 17 GM/SCOOP powder; Take 17 g by mouth 2 (two) times daily as needed for moderate constipation or severe constipation.  Dispense: 255 g; Refill: 1    -Red flags and when to present for emergency care or RTC including fever >101.60F, chest pain, shortness of breath, new/worsening/un-resolving symptoms, reviewed with patient at time of visit. Follow up and care instructions discussed and provided in AVS. - I discussed the assessment and treatment plan with the patient. The patient was provided an opportunity to ask questions and all were answered. The patient agreed with the plan and demonstrated an understanding of the instructions.  I provided 15 minutes of non-face-to-face time during this encounter.  Delsa Grana, PA-C 07/01/19 8:39 AM  Spent significant amount of time with the patient after appointment attempting to coordinate medications with her allergies with her pharmacy there was  trouble getting her medications and concerns about her side effects.  We have sent several MyChart messages back-and-forth as well total time of this visit was greater than 30 minutes today including her nonface-to-face virtual appointment time, chart review, coordination of care, chart documentation.  Also concerning is resistant infection, medication noncompliance, failed antibiotic treatment for suspected diverticulitis 2 weeks ago and high risk patient with uncontrolled diabetes, obesity, hyperlipidemia, chronic constipation and poor fluid intake with poor patient compliance with  medications treatment plans and follow-up

## 2019-07-01 NOTE — Addendum Note (Signed)
Addended by: Delsa Grana on: 07/01/2019 06:05 PM   Modules accepted: Orders, Level of Service

## 2019-07-02 ENCOUNTER — Encounter: Payer: Self-pay | Admitting: Family Medicine

## 2019-07-02 NOTE — Telephone Encounter (Signed)
Pt is aware and sent in email

## 2019-07-31 ENCOUNTER — Ambulatory Visit: Payer: BLUE CROSS/BLUE SHIELD | Admitting: Family Medicine

## 2019-09-25 ENCOUNTER — Encounter: Payer: Self-pay | Admitting: Family Medicine

## 2019-10-26 NOTE — Progress Notes (Signed)
Patient ID: Caitlin Gomez, female    DOB: 28-Jun-1980, 39 y.o.   MRN: [id number]  PCP: Delsa Grana, PA-C  Chief Complaint  Patient presents with  . Shoulder Pain    Right shoulder pain onset a couple of months ago and has started to get worse    Subjective:   Caitlin Gomez is a 39 y.o. female, presents to clinic with CC of the following:  Chief Complaint  Patient presents with  . Shoulder Pain    Right shoulder pain onset a couple of months ago and has started to get worse    HPI:  Patient is a 39 year old female patient of Delsa Grana She has a history of metabolic syndrome in addition to others noted in her active problem list. She follows up today with complaints of shoulder pain.  Shoulder pain started a couple months ago, with no prior trauma before it began.  She does note she lifts heavy things often. She describes the pain more posterior to the shoulder and the upper back area with pain also in the infrascapular area.  More recently, she has felt some pains up towards her neck.  This is all on the right side.  No pain really at the shoulder joint proper It has been more problematic in the past few days, bothering her at night when she tries to sleep.  Occasionally feels some achiness down her right arm, although no numbness or tingling down the arm, not dropping things, no weakness in the arm. She had a muscle relaxer at home, cyclobenzaprine which she has taken for a couple nights without much relief.  Tried some ibuprofen as well.  Also tried some icy hot rub product to apply. She denies any history of major back or shoulder injuries.  Patient Active Problem List   Diagnosis Date Noted  . Irregular menses 05/04/2019  . Metabolic syndrome 99991111  . Chronic idiopathic urticaria 05/04/2019  . Hypercholesterolemia 03/28/2018  . Morbid obesity with BMI of 40.0-44.9, adult ([place]) 09/05/2017  . History of diverticulitis 09/05/2017  . Incidental lung nodule  02/18/2017  . Allergic rhinitis 09/05/2016  . Fatty liver 06/10/2016  . Bipolar disorder ([place]) 03/05/2016  . Essential hypertension 03/05/2016  . Type 2 diabetes mellitus without complication, without long-term current use of insulin ([place]) 03/05/2016      Current Outpatient Medications:  .  albuterol (VENTOLIN HFA) 108 (90 Base) MCG/ACT inhaler, Inhale 2 puffs into the lungs every 6 (six) hours as needed for wheezing or shortness of breath., Disp: 18 g, Rfl: 1 .  glucose blood test strip, Contour Next Test Strips, Disp: 100 each, Rfl: 11 .  lamoTRIgine (LAMICTAL) 100 MG tablet, Take 250 mg by mouth daily. , Disp: , Rfl:  .  lisinopril (ZESTRIL) 10 MG tablet, Take 1 tablet (10 mg total) by mouth daily., Disp: 90 tablet, Rfl: 3 .  loratadine (CLARITIN) 10 MG tablet, Take 10 mg by mouth daily., Disp: , Rfl:  .  metFORMIN (GLUCOPHAGE) 500 MG tablet, TAKE 2 TABLETS BY MOUTH TWICE A DAY WITHA MEAL., Disp: 360 tablet, Rfl: 3 .  azelastine (ASTELIN) 0.1 % nasal spray, Place 2 sprays into both nostrils 2 (two) times daily. Use in each nostril as directed (Patient not taking: Reported on 10/27/2019), Disp: 30 mL, Rfl: 12 .  QUEtiapine (SEROQUEL) 50 MG tablet, Take 50 mg by mouth as needed. , Disp: , Rfl:    Allergies  Allergen Reactions  . Amoxicillin Other (See  Comments)    Chest pain   . Clindamycin Swelling  . Kale     Tongue swelling  . Oysters [Shellfish Allergy]     Dizzy, nauseated  . Ziprasidone Hcl Swelling  . Amoxicillin-Pot Clavulanate Rash     Past Surgical History:  Procedure Laterality Date  . TONSILLECTOMY AND ADENOIDECTOMY       Family History  Problem Relation Age of Onset  . Alcohol abuse Mother   . Hyperlipidemia Mother   . Hypertension Mother   . Mental illness Mother   . Diabetes Mother   . Diabetes Father   . Mental illness Paternal Aunt   . Cancer Paternal Aunt        lung  . Early death Maternal Grandmother   . Mental illness Maternal Grandmother   .  Stroke Paternal Grandfather   . Diabetes Paternal Grandfather   . Diabetes Paternal Grandmother      Social History   Tobacco Use  . Smoking status: Former Smoker    Types: Cigarettes  . Smokeless tobacco: Never Used  Substance Use Topics  . Alcohol use: Not Currently    With staff assistance, above reviewed with the patient today.  ROS: As per HPI, otherwise no specific complaints on a limited and focused system review   No results found for this or any previous visit (from the past 72 hour(s)).   PHQ2/9: Depression screen [facility] 2/9 10/27/2019 07/01/2019 06/17/2019 04/29/2019 03/03/2019  Decreased Interest 0 0 0 2 2  Down, Depressed, Hopeless 0 1 1 3 2   PHQ - 2 Score 0 1 1 5 4   Altered sleeping 1 0 1 3 2   Tired, decreased energy 1 1 1 2 3   Change in appetite 0 1 1 0 0  Feeling bad or failure about yourself  0 1 0 0 0  Trouble concentrating 0 0 2 0 1  Moving slowly or fidgety/restless 0 0 0 0 0  Suicidal thoughts 0 0 0 0 0  PHQ-9 Score 2 4 6 10 10   Difficult doing work/chores Not difficult at all Not difficult at all Somewhat difficult Not difficult at all Somewhat difficult   PHQ-2/9 Result is   Fall Risk: Fall Risk  10/27/2019 07/01/2019 06/17/2019 04/29/2019 03/03/2019  Falls in the past year? 0 0 0 0 0  Number falls in past yr: 0 0 0 0 0  Injury with Fall? 0 0 0 0 0      Objective:   Vitals:   10/27/19 1451  BP: 116/80  Pulse: 95  Resp: 16  Temp: 98.1 F (36.7 C)  TempSrc: Temporal  SpO2: 98%  Weight: 259 lb 9.6 oz (117.8 kg)  Height: 5' 6.5" (1.689 m)    Body mass index is 41.27 kg/m.  Physical Exam   NAD, masked, obese, pleasant HEENT - [place]/AT, sclera anicteric, Neck - supple, good range of motion, nontender with palpation over the cervical spine. Car - RRR without m/g/r Pulm- RR and effort normal at rest, CTA without wheeze or rales Back - no CVA tenderness, nontender with palpation over the thoracic spine, was tender in the infrascapular region to  palpation, with some tenderness extending up into towards the trap muscle complex.  Also some tenderness towards the shoulder joint proper in the posterior upper back region.  No marked spasm or swelling present. Ext -was nontender to palpation in the anterior and posterior joint lines of the right shoulder, nontender in the subacromial bursa region, nontender over the clavicle anteriorly.  Nontender over the [facility] joint proper.  No pain with abduction, some discomfort with forward elevation as approached 90 degrees and past 90 degrees, with the discomfort felt more in the posterior upper back region. Good arm strength including grip strength, sensation intact to light touch in the distal right upper extremity, Neuro/psychiatric - affect was not flat, appropriate with conversation  Alert and oriented  Grossly non-focal    Results for orders placed or performed in visit on 04/29/19  Urine Culture   Specimen: Urine  Result Value Ref Range   MICRO NUMBER: [id number]    SPECIMEN QUALITY: Adequate    Sample Source URINE    STATUS: FINAL    Result: No Growth   TSH  Result Value Ref Range   TSH 1.62 mIU/L  Lipid Panel  Result Value Ref Range   Cholesterol 224 (H) <200 mg/dL   HDL 41 (L) > OR = 50 mg/dL   Triglycerides 183 (H) <150 mg/dL   LDL Cholesterol (Calc) 151 (H) mg/dL (calc)   Total CHOL/HDL Ratio 5.5 (H) <5.0 (calc)   Non-HDL Cholesterol (Calc) 183 (H) <130 mg/dL (calc)  CMP w GFR  Result Value Ref Range   Glucose, Bld 183 (H) 65 - 99 mg/dL   BUN 8 7 - 25 mg/dL   Creat 0.71 0.50 - 1.10 mg/dL   GFR, Est Non African American 110 > OR = 60 mL/min/1.4m2   GFR, Est African American 127 > OR = 60 mL/min/1.47m2   BUN/Creatinine Ratio NOT APPLICABLE 6 - 22 (calc)   Sodium 137 135 - 146 mmol/L   Potassium 4.9 3.5 - 5.3 mmol/L   Chloride 102 98 - 110 mmol/L   CO2 26 20 - 32 mmol/L   Calcium 9.8 8.6 - 10.2 mg/dL   Total Protein 7.0 6.1 - 8.1 g/dL   Albumin 4.2 3.6 - 5.1 g/dL   Globulin  2.8 1.9 - 3.7 g/dL (calc)   AG Ratio 1.5 1.0 - 2.5 (calc)   Total Bilirubin 0.4 0.2 - 1.2 mg/dL   Alkaline phosphatase (APISO) 48 31 - 125 U/L   AST 28 10 - 30 U/L   ALT 37 (H) 6 - 29 U/L  CBC w/ Diff  Result Value Ref Range   WBC 7.4 3.8 - 10.8 Thousand/uL   RBC 4.60 3.80 - 5.10 Million/uL   Hemoglobin 12.6 11.7 - 15.5 g/dL   HCT 37.6 35.0 - 45.0 %   MCV 81.7 80.0 - 100.0 fL   MCH 27.4 27.0 - 33.0 pg   MCHC 33.5 32.0 - 36.0 g/dL   RDW 13.4 11.0 - 15.0 %   Platelets 285 140 - 400 Thousand/uL   MPV 9.5 7.5 - 12.5 fL   Neutro Abs 4,440 1,500 - 7,800 cells/uL   Lymphs Abs 2,020 850 - 3,900 cells/uL   Absolute Monocytes 540 200 - 950 cells/uL   Eosinophils Absolute 318 15 - 500 cells/uL   Basophils Absolute 81 0 - 200 cells/uL   Neutrophils Relative % 60 %   Total Lymphocyte 27.3 %   Monocytes Relative 7.3 %   Eosinophils Relative 4.3 %   Basophils Relative 1.1 %  A1C  Result Value Ref Range   Hgb A1c MFr Bld 8.0 (H) <5.7 % of total Hgb   Mean Plasma Glucose 183 (calc)   eAG (mmol/L) 10.1 (calc)  UA w/reflex microscopy, LAB  Result Value Ref Range   Color, Urine YELLOW YELLOW   APPearance CLEAR CLEAR   Specific Gravity, Urine  1.017 1.001 - 1.03   pH 5.5 5.0 - 8.0   Glucose, UA NEGATIVE NEGATIVE   Bilirubin Urine NEGATIVE NEGATIVE   Ketones, ur TRACE (A) NEGATIVE   Hgb urine dipstick NEGATIVE NEGATIVE   Protein, ur NEGATIVE NEGATIVE   Nitrite NEGATIVE NEGATIVE   Leukocytes,Ua NEGATIVE NEGATIVE       Assessment & Plan:    1. Upper back pain on right side Educated, do feel more a infrascapular/thoracic muscle strain/spasm as the source of her symptoms.  Often can get a pinched nerve component with this, and has some achiness felt at times down the arm but otherwise no numbness or tingling or weakness.  Relative rest from exertional activities until symptoms improving, recommending staying active below shoulder level, with no heavy lifting, and if symptoms improving, can  gradually progress up above shoulder level. Contrast therapy reviewed with warm compresses, followed by gentle ROM (not ballistic and exercises reviewed), followed by ice after to help lessen inflammation,  Naproxen - 550 mg twice daily with food prn short term Flexeril - 10 mg up to tid prn and warned may make drowsy and take predominantly at bedtime (not to drive if taking) Can apply patches to help, with a lidocaine patch 1 topically that can be applied to help with symptoms in the short-term. F/u if not improving or worsening and may need physical therapy to help, and await her response to the above. - cyclobenzaprine (FLEXERIL) 10 MG tablet; Take 1 tablet (10 mg total) by mouth 3 (three) times daily as needed for muscle spasms. May make drowsy, take predominantly at bedtime. Not to drive after taking  Dispense: 30 tablet; Refill: 1 - naproxen sodium (ANAPROX DS) 550 MG tablet; Take 1 tablet (550 mg total) by mouth 2 (two) times daily as needed. Take with food  Dispense: 20 tablet; Refill: 1  2. Trapezius muscle strain, right, initial encounter As noted above. - cyclobenzaprine (FLEXERIL) 10 MG tablet; Take 1 tablet (10 mg total) by mouth 3 (three) times daily as needed for muscle spasms. May make drowsy, take predominantly at bedtime. Not to drive after taking  Dispense: 30 tablet; Refill: 1 - naproxen sodium (ANAPROX DS) 550 MG tablet; Take 1 tablet (550 mg total) by mouth 2 (two) times daily as needed. Take with food  Dispense: 20 tablet; Refill: 1   We will follow-up if not improving or worsening.    Towanda Malkin, MD 10/27/19 3:04 PM

## 2019-10-27 ENCOUNTER — Encounter: Payer: Self-pay | Admitting: Internal Medicine

## 2019-10-27 ENCOUNTER — Other Ambulatory Visit: Payer: Self-pay

## 2019-10-27 ENCOUNTER — Ambulatory Visit (INDEPENDENT_AMBULATORY_CARE_PROVIDER_SITE_OTHER): Payer: BC Managed Care – PPO | Admitting: Internal Medicine

## 2019-10-27 VITALS — BP 116/80 | HR 95 | Temp 98.1°F | Resp 16 | Ht 66.5 in | Wt 259.6 lb

## 2019-10-27 DIAGNOSIS — S46811A Strain of other muscles, fascia and tendons at shoulder and upper arm level, right arm, initial encounter: Secondary | ICD-10-CM

## 2019-10-27 DIAGNOSIS — M549 Dorsalgia, unspecified: Secondary | ICD-10-CM | POA: Diagnosis not present

## 2019-10-27 MED ORDER — CYCLOBENZAPRINE HCL 10 MG PO TABS: 10.0000 mg | ORAL_TABLET | Freq: Three times a day (TID) | ORAL | 1 refills | Status: DC | PRN

## 2019-10-27 MED ORDER — NAPROXEN SODIUM 550 MG PO TABS: 550.0000 mg | ORAL_TABLET | Freq: Two times a day (BID) | ORAL | 1 refills | Status: DC | PRN

## 2019-10-27 NOTE — Patient Instructions (Signed)
Thoracic Strain A thoracic strain is an injury to the muscles or tendons that attach to the upper back. Tendons are tissues that connect muscle to bone. This injury is sometimes called a mid-back strain. A strain can be mild or very bad. A mild strain may take only 1-2 weeks to heal. A very bad strain involves torn muscles or tendons, so it may take 6-8 weeks to heal. What are the causes? This condition may be caused by:  A fall or a hit to the body.  Twisting or stretching the back too far. This may happen when doing activities that require a lot of energy, such as lifting heavy objects. In some cases, the cause may not be known. What increases the risk? This injury is more common in:  Athletes.  People who are very overweight (obese). What are the signs or symptoms?  Pain in the middle back, especially with movement. This is the main symptom.  Stiffness or limited range of motion.  Sudden muscle tightening (spasms). How is this treated? This condition may be treated with:  Resting the injured area.  Putting heat and cold on the injured area.  Medicines for pain and inflammation, such as NSAIDs.  Prescription medicine for pain or to relax the muscles. These may be used for a short time if needed.  Physical therapy. This will involve doing exercises to stretch and strengthen the middle back. Follow these instructions at home: Managing pain, stiffness, and swelling      If told, put ice on the injured area. ? Put ice in a plastic bag. ? Place a towel between your skin and the bag. ? Leave the ice on for 20 minutes, 2-3 times a day.  If told, put heat on the affected area. Do this as often as told by your doctor. Use the heat source that your doctor recommends, such as a moist heat pack or a heating pad. ? Place a towel between your skin and the heat source. ? Leave the heat on for 20-30 minutes. ? Remove the heat if your skin turns bright red. This is very important if  you are unable to feel pain, heat, or cold. You may have a greater risk of getting burned. Activity  Rest and return to your normal activities as told by your doctor. Ask your doctor what activities are safe for you.  Do exercises as told by your doctor. Medicines  Take over-the-counter and prescription medicines only as told by your doctor.  Ask your doctor if the medicine prescribed to you: ? Requires you to avoid driving or using heavy machinery. ? Can cause trouble pooping (constipation). You may need to take steps to prevent or treat trouble pooping:  Drink enough fluid to keep your pee (urine) pale yellow.  Take over-the-counter or prescription medicines.  Eat foods that are high in fiber. These include beans, whole grains, and fresh fruits and vegetables.  Limit foods that are high in fat and processed sugars. These include fried or sweet foods. Injury prevention To prevent a future mid-back injury:  Always warm up before physical activity or sports.  Cool down and stretch after being active.  Use correct form when playing sports and lifting heavy objects. Bend your knees before you lift heavy objects.  Use good posture when sitting and standing.  Stay physically fit and maintain a healthy weight. ? Do at least 150 minutes of moderate-intensity exercise each week, such as brisk walking or water aerobics. ? Do strength exercises   at least 2 times each week.  General instructions  Do not use any products that contain nicotine or tobacco. These products include cigarettes, e-cigarettes, and chewing tobacco. If you need help quitting, ask your doctor.  Keep all follow-up visits as told by your doctor. This is important. Contact a doctor if:  Your pain is not helped by medicine.  Your pain or stiffness is getting worse.  You have pain or stiffness in your neck or lower back. Get help right away if you:  Have shortness of breath.  Have chest pain.  Have weakness  or loss of feeling (numbness) in your legs or arms.  Cannot control when you pee (urinate). Summary  A thoracic strain is an injury to the muscles or tendons that attach to the upper back.  If told, put ice or heat on the affected area.  Rest and return to your normal activities as told by your doctor.  Keep all follow-up visits as told by your doctor. This information is not intended to replace advice given to you by your health care provider. Make sure you discuss any questions you have with your health care provider. Document Revised: 04/08/2018 Document Reviewed: 04/08/2018 Elsevier Patient Education  2020 [employer]  

## 2019-11-04 ENCOUNTER — Encounter: Payer: Self-pay | Admitting: Family Medicine

## 2020-01-04 ENCOUNTER — Ambulatory Visit: Payer: BC Managed Care – PPO | Admitting: Family Medicine

## 2020-01-08 ENCOUNTER — Other Ambulatory Visit: Payer: Self-pay

## 2020-01-08 DIAGNOSIS — Z20822 Contact with and (suspected) exposure to covid-19: Secondary | ICD-10-CM | POA: Insufficient documentation

## 2020-01-08 DIAGNOSIS — Z711 Person with feared health complaint in whom no diagnosis is made: Secondary | ICD-10-CM | POA: Diagnosis not present

## 2020-01-08 DIAGNOSIS — Z7984 Long term (current) use of oral hypoglycemic drugs: Secondary | ICD-10-CM | POA: Diagnosis not present

## 2020-01-08 DIAGNOSIS — E119 Type 2 diabetes mellitus without complications: Secondary | ICD-10-CM | POA: Insufficient documentation

## 2020-01-08 DIAGNOSIS — K219 Gastro-esophageal reflux disease without esophagitis: Secondary | ICD-10-CM | POA: Insufficient documentation

## 2020-01-08 DIAGNOSIS — R1013 Epigastric pain: Secondary | ICD-10-CM | POA: Diagnosis present

## 2020-01-08 DIAGNOSIS — I10 Essential (primary) hypertension: Secondary | ICD-10-CM | POA: Insufficient documentation

## 2020-01-08 DIAGNOSIS — A09 Infectious gastroenteritis and colitis, unspecified: Secondary | ICD-10-CM | POA: Insufficient documentation

## 2020-01-08 DIAGNOSIS — Z87891 Personal history of nicotine dependence: Secondary | ICD-10-CM | POA: Diagnosis not present

## 2020-01-08 DIAGNOSIS — Z79899 Other long term (current) drug therapy: Secondary | ICD-10-CM | POA: Diagnosis not present

## 2020-01-08 LAB — COMPREHENSIVE METABOLIC PANEL
ALT: 25 U/L (ref 0–44)
AST: 26 U/L (ref 15–41)
Albumin: 3.9 g/dL (ref 3.5–5.0)
Alkaline Phosphatase: 51 U/L (ref 38–126)
Anion gap: 11 (ref 5–15)
BUN: 6 mg/dL (ref 6–20)
CO2: 22 mmol/L (ref 22–32)
Calcium: 8.6 mg/dL — ABNORMAL LOW (ref 8.9–10.3)
Chloride: 101 mmol/L (ref 98–111)
Creatinine, Ser: 0.62 mg/dL (ref 0.44–1.00)
GFR calc Af Amer: >60 mL/min (ref >60)
GFR calc non Af Amer: >60 mL/min (ref >60)
Glucose, Bld: 180 mg/dL — ABNORMAL HIGH (ref 70–99)
Potassium: 3.8 mmol/L (ref 3.5–5.1)
Sodium: 134 mmol/L — ABNORMAL LOW (ref 135–145)
Total Bilirubin: 0.7 mg/dL (ref 0.3–1.2)
Total Protein: 7.4 g/dL (ref 6.5–8.1)

## 2020-01-08 LAB — CBC
HCT: 36.9 % (ref 36.0–46.0)
Hemoglobin: 12.3 g/dL (ref 12.0–15.0)
MCH: 27.5 pg (ref 26.0–34.0)
MCHC: 33.3 g/dL (ref 30.0–36.0)
MCV: 82.6 fL (ref 80.0–100.0)
Platelets: 325 10*3/uL (ref 150–400)
RBC: 4.47 MIL/uL (ref 3.87–5.11)
RDW: 12.8 % (ref 11.5–15.5)
WBC: 13.7 10*3/uL — ABNORMAL HIGH (ref 4.0–10.5)
nRBC: 0 % (ref 0.0–0.2)

## 2020-01-08 LAB — URINALYSIS, COMPLETE (UACMP) WITH MICROSCOPIC
Bacteria, UA: NONE SEEN
Bilirubin Urine: NEGATIVE
Glucose, UA: NEGATIVE mg/dL
Hgb urine dipstick: NEGATIVE
Ketones, ur: NEGATIVE mg/dL
Leukocytes,Ua: NEGATIVE
Nitrite: NEGATIVE
Protein, ur: NEGATIVE mg/dL
Specific Gravity, Urine: 1.017 (ref 1.005–1.030)
pH: 5 (ref 5.0–8.0)

## 2020-01-08 LAB — LIPASE, BLOOD: Lipase: 29 U/L (ref 11–51)

## 2020-01-08 MED ORDER — IOHEXOL 9 MG/ML PO SOLN
500.0000 mL | ORAL | Status: AC
  Administered 2020-01-09 (×2): 500 mL via ORAL

## 2020-01-08 NOTE — ED Triage Notes (Signed)
Patient c/o intermittent generalized abdominal pain, N/V/D beginning Sunday.

## 2020-01-09 ENCOUNTER — Emergency Department
Admission: EM | Admit: 2020-01-09 | Discharge: 2020-01-09 | Disposition: A | Discharge status: Home / Self Care | Payer: BC Managed Care – PPO | Attending: Emergency Medicine | Admitting: Emergency Medicine

## 2020-01-09 ENCOUNTER — Emergency Department: Payer: BC Managed Care – PPO

## 2020-01-09 ENCOUNTER — Encounter: Payer: Self-pay | Admitting: Radiology

## 2020-01-09 DIAGNOSIS — K529 Noninfective gastroenteritis and colitis, unspecified: Secondary | ICD-10-CM

## 2020-01-09 DIAGNOSIS — Z20822 Contact with and (suspected) exposure to covid-19: Secondary | ICD-10-CM

## 2020-01-09 LAB — SARS CORONAVIRUS 2 BY RT PCR (HOSPITAL ORDER, PERFORMED IN ~~LOC~~ HOSPITAL LAB): SARS Coronavirus 2: NEGATIVE

## 2020-01-09 LAB — HCG, QUANTITATIVE, PREGNANCY: hCG, Beta Chain, Quant, S: <1 m[IU]/mL (ref <5)

## 2020-01-09 LAB — PREGNANCY, URINE: Preg Test, Ur: NEGATIVE

## 2020-01-09 MED ORDER — SUCRALFATE 1 GM/10ML PO SUSP
1.0000 g | Freq: Four times a day (QID) | ORAL | 0 refills | Status: DC
Start: 2020-01-09 — End: 2020-04-18

## 2020-01-09 MED ORDER — IOHEXOL 300 MG/ML  SOLN
125.0000 mL | Freq: Once | INTRAMUSCULAR | Status: AC | PRN
  Administered 2020-01-09: 125 mL via INTRAVENOUS

## 2020-01-09 MED ORDER — ALUM & MAG HYDROXIDE-SIMETH 200-200-20 MG/5ML PO SUSP
30.0000 mL | Freq: Once | ORAL | Status: AC
  Administered 2020-01-09: 30 mL via ORAL
  Filled 2020-01-09: qty 30

## 2020-01-09 MED ORDER — DICYCLOMINE HCL 10 MG PO CAPS: 10.0000 mg | ORAL_CAPSULE | Freq: Four times a day (QID) | ORAL | 0 refills | Status: DC

## 2020-01-09 NOTE — ED Provider Notes (Signed)
[facility] Emergency Department Provider Note  ____________________________________________   First MD Initiated Contact with Patient 01/09/20 [phone]     (approximate)  I have reviewed the triage vital signs and the nursing notes.   HISTORY  Chief Complaint Abdominal Pain   HPI Caitlin Gomez is a 39 y.o. female with past medical history of bipolar disorder, DM, GERD, HTN, and PTSD who presents for assessment of approximately 1 week of intermittent epigastric crampy abdominal pain associated with nonbloody nonbilious vomiting and nonbloody diarrhea.  Patient states she currently feels better than she did initially presenting to the ED.  No clear alleviating or aggravating factors including food, alcohol, or NSAIDs.  No fevers, chills, cough, chest pain, back pain, headache, earache, sore throat, burning with urination, vaginal discharge, vaginal bleeding, rash, extremity pain, or other acute complaints.         Past Medical History:  Diagnosis Date  . Bipolar 1 disorder ([place])   . Chicken pox   . Depression   . Diabetes mellitus without complication ([place])   . Diverticulitis    12/2015  . Eating disorder   . Genital warts   . GERD (gastroesophageal reflux disease)   . Hypertension   . PTSD (post-traumatic stress disorder)     Patient Active Problem List   Diagnosis Date Noted  . Irregular menses 05/04/2019  . Metabolic syndrome 12/87/8676  . Chronic idiopathic urticaria 05/04/2019  . Hypercholesterolemia 03/28/2018  . Morbid obesity with BMI of 40.0-44.9, adult ([place]) 09/05/2017  . History of diverticulitis 09/05/2017  . Incidental lung nodule 02/18/2017  . Allergic rhinitis 09/05/2016  . Fatty liver 06/10/2016  . Bipolar disorder ([place]) 03/05/2016  . Essential hypertension 03/05/2016  . Type 2 diabetes mellitus without complication, without long-term current use of insulin ([place]) 03/05/2016    Past Surgical History:  Procedure Laterality  Date  . TONSILLECTOMY AND ADENOIDECTOMY      Prior to Admission medications   Medication Sig Start Date End Date Taking? Authorizing Provider  albuterol (VENTOLIN HFA) 108 (90 Base) MCG/ACT inhaler Inhale 2 puffs into the lungs every 6 (six) hours as needed for wheezing or shortness of breath. 03/12/19   Delsa Grana, PA-C  azelastine (ASTELIN) 0.1 % nasal spray Place 2 sprays into both nostrils 2 (two) times daily. Use in each nostril as directed Patient not taking: Reported on 10/27/2019 05/19/19   Delsa Grana, PA-C  cyclobenzaprine (FLEXERIL) 10 MG tablet Take 1 tablet (10 mg total) by mouth 3 (three) times daily as needed for muscle spasms. May make drowsy, take predominantly at bedtime. Not to drive after taking 12/21/92   Towanda Malkin, MD  dicyclomine (BENTYL) 10 MG capsule Take 1 capsule (10 mg total) by mouth 4 (four) times daily for 5 days. 01/09/20 01/14/20  Lucrezia Starch, MD  glucose blood test strip Contour Next Test Strips 12/16/18   Bedsole, Amy E, MD  lamoTRIgine (LAMICTAL) 100 MG tablet Take 250 mg by mouth daily.     [provider]  lisinopril (ZESTRIL) 10 MG tablet Take 1 tablet (10 mg total) by mouth daily. 03/12/19   Delsa Grana, PA-C  loratadine (CLARITIN) 10 MG tablet Take 10 mg by mouth daily.    [provider]  metFORMIN (GLUCOPHAGE) 500 MG tablet TAKE 2 TABLETS BY MOUTH TWICE A DAY WITHA MEAL. 03/12/19   Delsa Grana, PA-C  naproxen sodium (ANAPROX DS) 550 MG tablet Take 1 tablet (550 mg total) by mouth 2 (two) times daily  as needed. Take with food 10/27/19   Towanda Malkin, MD  QUEtiapine (SEROQUEL) 50 MG tablet Take 50 mg by mouth as needed.     [provider]  sucralfate (CARAFATE) 1 GM/10ML suspension Take 10 mLs (1 g total) by mouth 4 (four) times daily for 3 days. 01/09/20 01/12/20  Lucrezia Starch, MD    Allergies Amoxicillin, Clindamycin, Irene [place], Oysters [shellfish allergy], Ziprasidone hcl, and Amoxicillin-pot  clavulanate  Family History  Problem Relation Age of Onset  . Alcohol abuse Mother   . Hyperlipidemia Mother   . Hypertension Mother   . Mental illness Mother   . Diabetes Mother   . Diabetes Father   . Mental illness Paternal Aunt   . Cancer Paternal Aunt        lung  . Early death Maternal Grandmother   . Mental illness Maternal Grandmother   . Stroke Paternal Grandfather   . Diabetes Paternal Grandfather   . Diabetes Paternal Grandmother     Social History Social History   Tobacco Use  . Smoking status: Former Smoker    Types: Cigarettes  . Smokeless tobacco: Never Used  Vaping Use  . Vaping Use: Never used  Substance Use Topics  . Alcohol use: Not Currently  . Drug use: No    Review of Systems  Review of Systems  Constitutional: Negative for chills and fever.  HENT: Negative for sore throat.   Eyes: Negative for pain.  Respiratory: Negative for cough and stridor.   Cardiovascular: Negative for chest pain.  Gastrointestinal: Positive for abdominal pain, diarrhea, nausea and vomiting.  Skin: Negative for rash.  Neurological: Negative for seizures, loss of consciousness and headaches.  Psychiatric/Behavioral: Negative for suicidal ideas.  All other systems reviewed and are negative.     ____________________________________________   PHYSICAL EXAM:  VITAL SIGNS: ED Triage Vitals  Enc Vitals Group     BP 01/08/20 1916 (!) 139/99     Pulse Rate 01/08/20 1916 99     Resp 01/08/20 1916 18     Temp 01/08/20 1916 99.3 F (37.4 C)     Temp Source 01/08/20 1916 Oral     SpO2 01/08/20 1916 100 %     Weight 01/08/20 1913 248 lb (112.5 kg)     Height 01/08/20 1913 5\' 6"  (1.676 m)     Head Circumference --      Peak Flow --      Pain Score 01/08/20 1913 8     Pain Loc --      Pain Edu? --      Excl. in [place]? --    Vitals:   01/09/20 0510 01/09/20 0819  BP: (!) 159/91 (!) 157/93  Pulse: 84 78  Resp: 16 18  Temp: 97.9 F (36.6 C) 98.1 F (36.7 C)    SpO2: 100% 95%   Physical Exam Vitals and nursing note reviewed.  Constitutional:      General: She is not in acute distress.    Appearance: She is well-developed.  HENT:     Head: Normocephalic and atraumatic.  Eyes:     Conjunctiva/sclera: Conjunctivae normal.  Cardiovascular:     Rate and Rhythm: Normal rate and regular rhythm.     Heart sounds: No murmur heard.   Pulmonary:     Effort: Pulmonary effort is normal. No respiratory distress.     Breath sounds: Normal breath sounds.  Abdominal:     Palpations: Abdomen is soft.     Tenderness: There  is no abdominal tenderness.  Musculoskeletal:     Cervical back: Neck supple.  Skin:    General: Skin is warm and dry.     Capillary Refill: Capillary refill takes less than 2 seconds.  Neurological:     General: No focal deficit present.     Mental Status: She is alert.  Psychiatric:        Mood and Affect: Mood normal.      ____________________________________________   LABS (all labs ordered are listed, but only abnormal results are displayed)  Labs Reviewed  COMPREHENSIVE METABOLIC PANEL - Abnormal; Notable for the following components:      Result Value   Sodium 134 (*)    Glucose, Bld 180 (*)    Calcium 8.6 (*)    All other components within normal limits  CBC - Abnormal; Notable for the following components:   WBC 13.7 (*)    All other components within normal limits  URINALYSIS, COMPLETE (UACMP) WITH MICROSCOPIC - Abnormal; Notable for the following components:   Color, Urine YELLOW (*)    APPearance HAZY (*)    All other components within normal limits  SARS CORONAVIRUS 2 BY RT PCR (HOSPITAL ORDER, [facility])  LIPASE, BLOOD  PREGNANCY, URINE  HCG, QUANTITATIVE, PREGNANCY   ____________________________________________  EKG  Normal sinus rhythm with a ventricular rate of 96, normal axis, unremarkable intervals, no evidence of acute ischemia no significant underlying  arrhythmia. ____________________________________________  RADIOLOGY  Official radiology report(s): CT Abdomen Pelvis W Contrast  Result Date: 01/09/2020 CLINICAL DATA:  Abdominal pain.  Nausea, vomiting and diarrhea. EXAM: CT ABDOMEN AND PELVIS WITH CONTRAST TECHNIQUE: Multidetector CT imaging of the abdomen and pelvis was performed using the standard protocol following bolus administration of intravenous contrast. CONTRAST:  192mL OMNIPAQUE IOHEXOL 300 MG/ML  SOLN COMPARISON:  None. FINDINGS: LOWER CHEST: Normal. HEPATOBILIARY: Normal hepatic contours. No intra- or extrahepatic biliary dilatation. Normal gallbladder. PANCREAS: Normal pancreas. No ductal dilatation or peripancreatic fluid collection. SPLEEN: Normal. ADRENALS/URINARY TRACT: The adrenal glands are normal. No hydronephrosis, nephroureterolithiasis or solid renal mass. The urinary bladder is normal for degree of distention STOMACH/BOWEL: There is no hiatal hernia. Normal duodenal course and caliber. No small bowel dilatation or inflammation. No focal colonic abnormality. Normal appendix. VASCULAR/LYMPHATIC: Normal course and caliber of the major abdominal vessels. No abdominal or pelvic lymphadenopathy. REPRODUCTIVE: Normal uterus. No adnexal mass. MUSCULOSKELETAL. No bony spinal canal stenosis or focal osseous abnormality. OTHER: None. IMPRESSION: No acute abnormality of the abdomen or pelvis. Electronically Signed   By: Ulyses Jarred M.D.   On: 01/09/2020 05:48    ____________________________________________   PROCEDURES  Procedure(s) performed (including Critical Care):  Procedures   ____________________________________________   INITIAL IMPRESSION / ASSESSMENT AND PLAN / ED COURSE        Overall patient's history, exam, and ED work-up is most consistent with gastritis most likely infectious.  No evidence of acute pancreatitis, acute cholestatic process, appendicitis, cystitis, pyelonephritis, diverticulitis, or other  acute intra-abdominal pathology on work-up or CT.  Patient stated she already had had nausea medicine at home and declined any at this time.  Covid PCR sent patient instructed on appropriate follow-up regarding this test and that she should consider self infectious until she get the result.  Rx written for Bentyl and Carafate.  Discharged stable condition.  Strict return precautions advised discussed.          ____________________________________________   FINAL CLINICAL IMPRESSION(S) / ED DIAGNOSES  Final diagnoses:  Gastroenteritis  Person under investigation for COVID-19     ED Discharge Orders         Ordered    sucralfate (CARAFATE) 1 GM/10ML suspension  4 times daily     Discontinue  Reprint     01/09/20 0900    dicyclomine (BENTYL) 10 MG capsule  4 times daily     Discontinue  Reprint     01/09/20 0900           Note:  This document was prepared using Dragon voice recognition software and may include unintentional dictation errors.   Lucrezia Starch, MD 01/09/20 [phone]

## 2020-01-11 ENCOUNTER — Encounter: Payer: Self-pay | Admitting: Family Medicine

## 2020-01-12 LAB — POCT PREGNANCY, URINE: Preg Test, Ur: NEGATIVE

## 2020-01-12 NOTE — Telephone Encounter (Signed)
Pt should get f/up appt after ER visits to allow [place] to review everything and f/up on what we need to, cannot do through mychart or pt messages Please ask her to schedule an appt

## 2020-01-18 ENCOUNTER — Ambulatory Visit: Payer: BC Managed Care – PPO | Admitting: Family Medicine

## 2020-01-18 ENCOUNTER — Encounter: Payer: Self-pay | Admitting: Family Medicine

## 2020-01-18 ENCOUNTER — Other Ambulatory Visit: Payer: Self-pay

## 2020-01-18 VITALS — BP 124/82 | HR 90 | Temp 98.3°F | Resp 18 | Ht 66.0 in | Wt 254.1 lb

## 2020-01-18 DIAGNOSIS — Z09 Encounter for follow-up examination after completed treatment for conditions other than malignant neoplasm: Secondary | ICD-10-CM

## 2020-01-18 DIAGNOSIS — E1165 Type 2 diabetes mellitus with hyperglycemia: Secondary | ICD-10-CM

## 2020-01-18 DIAGNOSIS — I10 Essential (primary) hypertension: Secondary | ICD-10-CM

## 2020-01-18 DIAGNOSIS — R109 Unspecified abdominal pain: Secondary | ICD-10-CM

## 2020-01-18 DIAGNOSIS — E7800 Pure hypercholesterolemia, unspecified: Secondary | ICD-10-CM | POA: Diagnosis not present

## 2020-01-18 MED ORDER — GLUCOSE BLOOD VI STRP: ORAL_STRIP | 3 refills | Status: DC

## 2020-01-18 MED ORDER — LISINOPRIL 10 MG PO TABS: 10.0000 mg | ORAL_TABLET | Freq: Every day | ORAL | 3 refills | Status: DC

## 2020-01-18 MED ORDER — PANTOPRAZOLE SODIUM 40 MG PO TBEC: 40.0000 mg | DELAYED_RELEASE_TABLET | Freq: Every day | ORAL | 3 refills | Status: DC

## 2020-01-18 MED ORDER — DICYCLOMINE HCL 20 MG PO TABS: 20.0000 mg | ORAL_TABLET | Freq: Three times a day (TID) | ORAL | 2 refills | Status: DC

## 2020-01-18 MED ORDER — METFORMIN HCL 500 MG PO TABS: ORAL_TABLET | ORAL | 3 refills | Status: DC

## 2020-01-18 NOTE — Progress Notes (Signed)
Patient ID: Caitlin Gomez, female    DOB: 10/28/1980, 39 y.o.   MRN: [id number]  PCP: Delsa Grana, PA-C  Chief Complaint  Patient presents with  . ER Follow Up  . Abdominal Pain    Subjective:   Caitlin Gomez is a 39 y.o. female, presents to clinic with CC of the following:  HPI  abd pain - ER f/up from 01/09/2020 - encounter, labs, imaging, EKG all reviewed today and reviewed with the pt  Hx of DM poorly controlled, HLD, abd pain recurrent - poor f/up   All over abd pain sharp and cramping with N, V and diarrhea, different from her other times of abd pain  Diarrhea improved and she cut out dairy and BMs better, but she is developing some constipation  She feels like she has a band around her entire upper abdomen.  Carafate helped.  She endorses some reflux sx.   She was unable to tolerate taking bentyl cause capsules get stuck and make her sick  She has not seen GI before Denies urinary sx  DM - not checking sugars - out of strips  She went to [facility] to est care a few weeks ago, but would like to continue to come here.  She did labs there and is willing to sign for records.    Patient Active Problem List   Diagnosis Date Noted  . Irregular menses 05/04/2019  . Metabolic syndrome 56/31/4970  . Chronic idiopathic urticaria 05/04/2019  . Hypercholesterolemia 03/28/2018  . Morbid obesity with BMI of 40.0-44.9, adult ([place]) 09/05/2017  . History of diverticulitis 09/05/2017  . Incidental lung nodule 02/18/2017  . Allergic rhinitis 09/05/2016  . Fatty liver 06/10/2016  . Bipolar disorder ([place]) 03/05/2016  . Essential hypertension 03/05/2016  . Type 2 diabetes mellitus without complication, without long-term current use of insulin ([place]) 03/05/2016      Current Outpatient Medications:  .  albuterol (VENTOLIN HFA) 108 (90 Base) MCG/ACT inhaler, Inhale 2 puffs into the lungs every 6 (six) hours as needed for wheezing or shortness of breath., Disp: 18  g, Rfl: 1 .  lamoTRIgine (LAMICTAL) 100 MG tablet, Take 250 mg by mouth daily. , Disp: , Rfl:  .  lisinopril (ZESTRIL) 10 MG tablet, Take 1 tablet (10 mg total) by mouth daily., Disp: 90 tablet, Rfl: 3 .  loratadine (CLARITIN) 10 MG tablet, Take 10 mg by mouth daily., Disp: , Rfl:  .  metFORMIN (GLUCOPHAGE) 500 MG tablet, TAKE 2 TABLETS BY MOUTH TWICE A DAY WITHA MEAL., Disp: 360 tablet, Rfl: 3 .  naproxen sodium (ANAPROX DS) 550 MG tablet, Take 1 tablet (550 mg total) by mouth 2 (two) times daily as needed. Take with food, Disp: 20 tablet, Rfl: 1 .  QUEtiapine (SEROQUEL) 50 MG tablet, Take 50 mg by mouth as needed. , Disp: , Rfl:  .  azelastine (ASTELIN) 0.1 % nasal spray, Place 2 sprays into both nostrils 2 (two) times daily. Use in each nostril as directed (Patient not taking: Reported on 10/27/2019), Disp: 30 mL, Rfl: 12 .  cyclobenzaprine (FLEXERIL) 10 MG tablet, Take 1 tablet (10 mg total) by mouth 3 (three) times daily as needed for muscle spasms. May make drowsy, take predominantly at bedtime. Not to drive after taking (Patient not taking: Reported on 01/18/2020), Disp: 30 tablet, Rfl: 1 .  dicyclomine (BENTYL) 10 MG capsule, Take 1 capsule (10 mg total) by mouth 4 (four) times daily for 5 days., Disp: 20 capsule,  Rfl: 0 .  glucose blood test strip, Contour Next Test Strips (Patient not taking: Reported on 01/18/2020), Disp: 100 each, Rfl: 11 .  sucralfate (CARAFATE) 1 GM/10ML suspension, Take 10 mLs (1 g total) by mouth 4 (four) times daily for 3 days., Disp: 120 mL, Rfl: 0   Allergies  Allergen Reactions  . Amoxicillin Other (See Comments)    Chest pain   . Clindamycin Swelling  . Kale     Tongue swelling  . Oysters [Shellfish Allergy]     Dizzy, nauseated  . Ziprasidone Hcl Swelling  . Amoxicillin-Pot Clavulanate Rash     Social History   Tobacco Use  . Smoking status: Former Smoker    Types: Cigarettes  . Smokeless tobacco: Never Used  Vaping Use  . Vaping Use: Never used    Substance Use Topics  . Alcohol use: Not Currently  . Drug use: No      Chart Review Today: I personally reviewed active problem list, medication list, allergies, family history, social history, health maintenance, notes from last encounter, lab results, imaging with the patient/caregiver today.   Review of Systems 10 Systems reviewed and are negative for acute change except as noted in the HPI.     Objective:   Vitals:   01/18/20 1128  BP: 124/82  Pulse: 90  Resp: 18  Temp: 98.3 F (36.8 C)  TempSrc: Oral  SpO2: 99%  Weight: 254 lb 1.6 oz (115.3 kg)  Height: 5\' 6"  (1.676 m)    Body mass index is 41.01 kg/m.  Physical Exam Vitals and nursing note reviewed.  Constitutional:      General: She is not in acute distress.    Appearance: She is obese. She is not ill-appearing, toxic-appearing or diaphoretic.  HENT:     Head: Normocephalic and atraumatic.  Eyes:     General: No scleral icterus.       Right eye: No discharge.        Left eye: No discharge.     Conjunctiva/sclera: Conjunctivae normal.  Cardiovascular:     Rate and Rhythm: Normal rate and regular rhythm.     Pulses: Normal pulses.     Heart sounds: Normal heart sounds. No murmur heard.  No friction rub. No gallop.   Pulmonary:     Effort: Pulmonary effort is normal. No respiratory distress.     Breath sounds: Normal breath sounds. No stridor. No wheezing, rhonchi or rales.  Abdominal:     General: Abdomen is protuberant. Bowel sounds are normal. There is no distension.     Palpations: Abdomen is soft. There is no pulsatile mass.     Tenderness: There is no abdominal tenderness. There is no right CVA tenderness, left CVA tenderness, guarding or rebound. Negative signs include Murphy's sign.  Skin:    General: Skin is warm and dry.     Coloration: Skin is not jaundiced or pale.  Neurological:     Mental Status: She is alert. Mental status is at baseline.  Psychiatric:        Mood and Affect: Mood  normal.        Behavior: Behavior normal.      Results for orders placed or performed during the hospital encounter of 01/09/20  SARS Coronavirus 2 by RT PCR (hospital order, performed in [facility] hospital lab) Nasopharyngeal Nasopharyngeal Swab   Specimen: Nasopharyngeal Swab  Result Value Ref Range   SARS Coronavirus 2 NEGATIVE NEGATIVE  Lipase, blood  Result Value Ref Range  Lipase 29 11 - 51 U/L  Comprehensive metabolic panel  Result Value Ref Range   Sodium 134 (L) 135 - 145 mmol/L   Potassium 3.8 3.5 - 5.1 mmol/L   Chloride 101 98 - 111 mmol/L   CO2 22 22 - 32 mmol/L   Glucose, Bld 180 (H) 70 - 99 mg/dL   BUN 6 6 - 20 mg/dL   Creatinine, Ser 0.62 0.44 - 1.00 mg/dL   Calcium 8.6 (L) 8.9 - 10.3 mg/dL   Total Protein 7.4 6.5 - 8.1 g/dL   Albumin 3.9 3.5 - 5.0 g/dL   AST 26 15 - 41 U/L   ALT 25 0 - 44 U/L   Alkaline Phosphatase 51 38 - 126 U/L   Total Bilirubin 0.7 0.3 - 1.2 mg/dL   GFR calc non Af Amer >60 >60 mL/min   GFR calc Af Amer >60 >60 mL/min   Anion gap 11 5 - 15  CBC  Result Value Ref Range   WBC 13.7 (H) 4.0 - 10.5 K/uL   RBC 4.47 3.87 - 5.11 MIL/uL   Hemoglobin 12.3 12.0 - 15.0 g/dL   HCT 36.9 36 - 46 %   MCV 82.6 80.0 - 100.0 fL   MCH 27.5 26.0 - 34.0 pg   MCHC 33.3 30.0 - 36.0 g/dL   RDW 12.8 11.5 - 15.5 %   Platelets 325 150 - 400 K/uL   nRBC 0.0 0.0 - 0.2 %  Urinalysis, Complete w Microscopic Urine, Clean Catch  Result Value Ref Range   Color, Urine YELLOW (A) YELLOW   APPearance HAZY (A) CLEAR   Specific Gravity, Urine 1.017 1.005 - 1.030   pH 5.0 5.0 - 8.0   Glucose, UA NEGATIVE NEGATIVE mg/dL   Hgb urine dipstick NEGATIVE NEGATIVE   Bilirubin Urine NEGATIVE NEGATIVE   Ketones, ur NEGATIVE NEGATIVE mg/dL   Protein, ur NEGATIVE NEGATIVE mg/dL   Nitrite NEGATIVE NEGATIVE   Leukocytes,Ua NEGATIVE NEGATIVE   RBC / HPF 0-5 0 - 5 RBC/hpf   WBC, UA 0-5 0 - 5 WBC/hpf   Bacteria, UA NONE SEEN NONE SEEN   Squamous Epithelial / LPF 0-5 0 - 5     Mucus PRESENT   hCG, quantitative, pregnancy  Result Value Ref Range   hCG, Beta Chain, Quant, S <1 <5 mIU/mL  Pregnancy, urine  Result Value Ref Range   Preg Test, Ur NEGATIVE NEGATIVE  Pregnancy, urine POC  Result Value Ref Range   Preg Test, Ur NEGATIVE NEGATIVE       Assessment & Plan:   1. Abdominal pain, unspecified abdominal location Patient has continued abdominal pain, states that this occurs abdominal pain with different than prior episodes. She was diagnosed with gastroenteritis in the ER She has cut out dairy and this has improved her chronic diarrhea significantly She does endorse some GERD and reflux symptoms notes a feeling of a band across her upper abdomen. ddx includes GERD, gastritis, peptic ulcer disease, gastroparesis or biliary colic Bentyl in tablet form was prescribed for her today Trial of PPI-Protonix prescribed Patient was instructed to follow-up in about a month-if no improvement GI specialist would be very helpful  - CBC with Differential/Platelet - COMPLETE METABOLIC PANEL WITH GFR - Lipid panel - Hemoglobin A1c - Lipase - dicyclomine (BENTYL) 20 MG tablet; Take 1 tablet (20 mg total) by mouth 4 (four) times daily -  before meals and at bedtime.  Dispense: 40 tablet; Refill: 2 - pantoprazole (PROTONIX) 40 MG tablet; Take  1 tablet (40 mg total) by mouth daily.  Dispense: 30 tablet; Refill: 3  2. Type 2 diabetes mellitus with hyperglycemia, without long-term current use of insulin ([place]) Patient is not checking blood sugars, is compliant with Metformin She does not want to do labs today requested we get records from [facility] which did labs a few weeks ago. Her blood sugar in the ER was 180 Suspect that it may continue to be uncontrolled She was hesitant to try any other medications other than Metformin She has continued to work on diet and exercise  - CBC with Differential/Platelet - COMPLETE METABOLIC PANEL WITH GFR - Hemoglobin  A1c - Microalbumin, urine - glucose blood test strip; Contour Next Test Strips - check CBG once daily  Dispense: 100 each; Refill: 3 - metFORMIN (GLUCOPHAGE) 500 MG tablet; TAKE 2 TABLETS BY MOUTH TWICE A DAY WITHA MEAL.  Dispense: 360 tablet; Refill: 3  3. Essential hypertension Blood pressure was well controlled today, stable She denies any palpitations, chest pain, headaches, not monitoring her blood pressure at home, no side effects or concerns of her medications, refill sent in today She refused labs Will need to review her kidney function and electrolytes from records review - COMPLETE METABOLIC PANEL WITH GFR - lisinopril (ZESTRIL) 10 MG tablet; Take 1 tablet (10 mg total) by mouth daily.  Dispense: 90 tablet; Refill: 3  4. Hypercholesterolemia - COMPLETE METABOLIC PANEL WITH GFR - Lipid panel  5. Encounter for examination following treatment at hospital Encounter reviewed -her labs EKG and imaging all reviewed with patient today and she was reassured. - CBC with Differential/Platelet - COMPLETE METABOLIC PANEL WITH GFR - Hemoglobin A1c      Delsa Grana, PA-C 01/18/20 11:43 AM

## 2020-01-18 NOTE — Patient Instructions (Signed)
Call me in 4 weeks after protonix trial - if stomach symptoms are not better we need you to go see a GI specialists for further evaluation

## 2020-03-22 ENCOUNTER — Ambulatory Visit: Admit: 2020-03-22 | Discharge status: Absent without leave | Payer: BC Managed Care – PPO

## 2020-04-05 ENCOUNTER — Ambulatory Visit: Payer: BC Managed Care – PPO | Admitting: Family Medicine

## 2020-04-18 ENCOUNTER — Encounter: Payer: Self-pay | Admitting: Family Medicine

## 2020-04-18 ENCOUNTER — Other Ambulatory Visit: Payer: Self-pay

## 2020-04-18 ENCOUNTER — Other Ambulatory Visit: Payer: Self-pay | Admitting: Family Medicine

## 2020-04-18 ENCOUNTER — Ambulatory Visit: Payer: BC Managed Care – PPO | Admitting: Family Medicine

## 2020-04-18 VITALS — BP 160/100 | HR 90 | Temp 98.6°F | Resp 16 | Ht 66.0 in | Wt 253.7 lb

## 2020-04-18 DIAGNOSIS — L501 Idiopathic urticaria: Secondary | ICD-10-CM

## 2020-04-18 DIAGNOSIS — I10 Essential (primary) hypertension: Secondary | ICD-10-CM | POA: Diagnosis not present

## 2020-04-18 DIAGNOSIS — J309 Allergic rhinitis, unspecified: Secondary | ICD-10-CM

## 2020-04-18 DIAGNOSIS — Z6841 Body Mass Index (BMI) 40.0 and over, adult: Secondary | ICD-10-CM

## 2020-04-18 DIAGNOSIS — Z1159 Encounter for screening for other viral diseases: Secondary | ICD-10-CM | POA: Diagnosis not present

## 2020-04-18 DIAGNOSIS — E8881 Metabolic syndrome: Secondary | ICD-10-CM

## 2020-04-18 DIAGNOSIS — F319 Bipolar disorder, unspecified: Secondary | ICD-10-CM

## 2020-04-18 DIAGNOSIS — R21 Rash and other nonspecific skin eruption: Secondary | ICD-10-CM

## 2020-04-18 DIAGNOSIS — E1165 Type 2 diabetes mellitus with hyperglycemia: Secondary | ICD-10-CM | POA: Diagnosis not present

## 2020-04-18 DIAGNOSIS — N926 Irregular menstruation, unspecified: Secondary | ICD-10-CM

## 2020-04-18 DIAGNOSIS — E7800 Pure hypercholesterolemia, unspecified: Secondary | ICD-10-CM

## 2020-04-18 LAB — POCT URINE PREGNANCY: Preg Test, Ur: NEGATIVE

## 2020-04-18 MED ORDER — FAMOTIDINE 20 MG PO TABS
20.0000 mg | ORAL_TABLET | Freq: Two times a day (BID) | ORAL | 3 refills | Status: DC
Start: 2020-04-18 — End: 2020-11-15

## 2020-04-18 MED ORDER — DESLORATADINE 5 MG PO TABS: 5.0000 mg | ORAL_TABLET | Freq: Every day | ORAL | 2 refills | Status: DC

## 2020-04-18 MED ORDER — TRIAMCINOLONE ACETONIDE 0.1 % EX OINT: 1.0000 "application " | TOPICAL_OINTMENT | Freq: Two times a day (BID) | CUTANEOUS | 1 refills | Status: DC | PRN

## 2020-04-18 NOTE — Progress Notes (Signed)
  Patient ID: Caitlin Gomez, female    DOB: 07/12/1980, 39 y.o.   MRN: [id number]  PCP: Tapia, Leisa, PA-C  Chief Complaint  Patient presents with  . Menstrual Problem    Only spotting. Not having her full period  . Pruritis    She has been having some itching x 1 year and it has worsened over the past month. Itching comes and goes.  . Rash    She has a scaly rash on her but x 1 week. Rash is itchy and painful.    Subjective:   Caitlin Gomez is a 39 y.o. female, presents to clinic with CC of the following - multiple chronic conditions needing f/up and several acute complaints   Pruritis/hives- ongoing for years, chronic intermittent on clarinex in the past with allergy specialists, previously said it was too expensive, today notes that it may not have been refilled? On other antihistamines, singulair sometimes seems to trigger HA's Hydroxyzine and Benadryl she does use for when her itching is severe but it is very sedating so she tries to avoid using frequently  Rash -patient also reports recurrent rash to her buttocks area is raised in a linear distribution to red scaly, she does not want to have me examine or look at it today.  She states that it is consistent with eczema and it has gotten a little bit better with over-the-counter eczema cream.  Menses very light/spotting, not on birth control, sexually active, hx of regular menses with normal bleeding, much lighter, no cramping, N, urinary sx   DM:   Pt managing DM with  1000 mg BID A1C 7 something at [facility] - asked for labs byut not received  Reports good med compliance Pt has no SE from meds - gi sx better Blood sugars not checking, needs new meter Denies: Polyuria, polydipsia, vision changes, neuropathy, hypoglycemia Recent pertinent labs: Lab Results  Component Value Date   HGBA1C 8.0 (H) 04/29/2019   HGBA1C 8.4 (H) 12/09/2018   HGBA1C 8.3 (H) 03/21/2018    Obesity - has been working on diet, loosing  weight  Wt Readings from Last 5 Encounters:  04/18/20 253 lb 11.2 oz (115.1 kg)  01/18/20 254 lb 1.6 oz (115.3 kg)  01/08/20 248 lb (112.5 kg)  10/27/19 259 lb 9.6 oz (117.8 kg)  07/01/19 249 lb (112.9 kg)   BMI Readings from Last 5 Encounters:  04/18/20 40.95 kg/m  01/18/20 41.01 kg/m  01/08/20 40.03 kg/m  10/27/19 41.27 kg/m  07/01/19 39.59 kg/m   Hypertension:  Currently managed on lisinopril 10 mg Pt reports good med compliance and denies any SE - didn't take today and anxious Blood pressure today is elevated BP Readings from Last 3 Encounters:  04/18/20 (!) 160/100  01/18/20 124/82  01/09/20 (!) 157/93   Pt denies CP, SOB, exertional sx, LE edema, palpitation, Ha's, visual disturbances, lightheadedness, hypotension, syncope.    Bipolar - moods well controlled Managed by psych On lamictal, seroquel prn     Patient Active Problem List   Diagnosis Date Noted  . Irregular menses 05/04/2019  . Metabolic syndrome 05/04/2019  . Chronic idiopathic urticaria 05/04/2019  . Hypercholesterolemia 03/28/2018  . Morbid obesity with BMI of 40.0-44.9, adult ([facility]) 09/05/2017  . History of diverticulitis 09/05/2017  . Incidental lung nodule 02/18/2017  . Allergic rhinitis 09/05/2016  . Fatty liver 06/10/2016  . Bipolar disorder ([facility]) 03/05/2016  . Essential hypertension 03/05/2016  . Type 2 diabetes mellitus without complication, without long-term   current use of insulin ([facility]) 03/05/2016      Current Outpatient Medications:  .  albuterol (VENTOLIN HFA) 108 (90 Base) MCG/ACT inhaler, Inhale 2 puffs into the lungs every 6 (six) hours as needed for wheezing or shortness of breath., Disp: 18 g, Rfl: 1 .  azelastine (ASTELIN) 0.1 % nasal spray, Place 2 sprays into both nostrils 2 (two) times daily. Use in each nostril as directed, Disp: 30 mL, Rfl: 12 .  glucose blood test strip, Contour Next Test Strips - check CBG once daily, Disp: 100 each, Rfl: 3 .  hydrOXYzine  (ATARAX/VISTARIL) 10 MG tablet, Take 10 mg by mouth 2 (two) times daily., Disp: , Rfl:  .  lamoTRIgine (LAMICTAL) 100 MG tablet, Take 250 mg by mouth daily. , Disp: , Rfl:  .  lisinopril (ZESTRIL) 10 MG tablet, Take 1 tablet (10 mg total) by mouth daily., Disp: 90 tablet, Rfl: 3 .  loratadine (CLARITIN) 10 MG tablet, Take 10 mg by mouth daily., Disp: , Rfl:  .  metFORMIN (GLUCOPHAGE) 1000 MG tablet, metformin 1,000 mg tablet, Disp: , Rfl:  .  pantoprazole (PROTONIX) 40 MG tablet, Take 1 tablet (40 mg total) by mouth daily., Disp: 30 tablet, Rfl: 3 .  QUEtiapine (SEROQUEL) 50 MG tablet, Take 50 mg by mouth as needed. , Disp: , Rfl:    Allergies  Allergen Reactions  . Amoxicillin Other (See Comments)    Chest pain   . Clindamycin Swelling  . Kale     Tongue swelling  . Oysters [Shellfish Allergy]     Dizzy, nauseated  . Ziprasidone Hcl Swelling  . Amoxicillin-Pot Clavulanate Rash     Social History   Tobacco Use  . Smoking status: Former Smoker    Types: Cigarettes  . Smokeless tobacco: Never Used  Vaping Use  . Vaping Use: Never used  Substance Use Topics  . Alcohol use: Not Currently  . Drug use: No      Chart Review Today: I personally reviewed active problem list, medication list, allergies, family history, social history, health maintenance, notes from last encounter, lab results, imaging with the patient/caregiver today.   Review of Systems 10 Systems reviewed and are negative for acute change except as noted in the HPI.     Objective:   Vitals:   04/18/20 0952 04/18/20 0959  BP: (!) 160/100 (!) 160/100  Pulse: 90   Resp: 16   Temp: 98.6 F (37 C)   TempSrc: Oral   SpO2: 99%   Weight: 253 lb 11.2 oz (115.1 kg)   Height: 5' 6" (1.676 m)     Body mass index is 40.95 kg/m.  Physical Exam Vitals and nursing note reviewed.  Constitutional:      General: She is not in acute distress.    Appearance: Normal appearance. She is well-developed. She is obese.  She is not ill-appearing, toxic-appearing or diaphoretic.     Interventions: Face mask in place.  HENT:     Head: Normocephalic and atraumatic.     Right Ear: External ear normal.     Left Ear: External ear normal.  Eyes:     General: Lids are normal. No scleral icterus.       Right eye: No discharge.        Left eye: No discharge.     Conjunctiva/sclera: Conjunctivae normal.  Neck:     Trachea: Phonation normal. No tracheal deviation.  Cardiovascular:     Rate and Rhythm: Normal rate and regular rhythm.       Pulses: Normal pulses.          Radial pulses are 2+ on the right side and 2+ on the left side.       Posterior tibial pulses are 2+ on the right side and 2+ on the left side.     Heart sounds: Normal heart sounds. No murmur heard.  No friction rub. No gallop.   Pulmonary:     Effort: Pulmonary effort is normal. No respiratory distress.     Breath sounds: Normal breath sounds. No stridor. No wheezing, rhonchi or rales.  Chest:     Chest wall: No tenderness.  Abdominal:     General: Bowel sounds are normal. There is no distension.     Palpations: Abdomen is soft. There is no mass.     Tenderness: There is no abdominal tenderness. There is no right CVA tenderness, left CVA tenderness, guarding or rebound.  Musculoskeletal:     Right lower leg: No edema.     Left lower leg: No edema.  Skin:    General: Skin is warm and dry.     Coloration: Skin is not jaundiced or pale.     Findings: No rash.     Comments: Pt refused exam of area with noted rash  Neurological:     Mental Status: She is alert.     Motor: No abnormal muscle tone.     Gait: Gait normal.  Psychiatric:        Mood and Affect: Mood normal.        Speech: Speech normal.        Behavior: Behavior normal.           Assessment & Plan:   Avoiding dairy GI sx better Tolerating metformin  1. Essential hypertension Elevated today - did not take meds, remained high with rechecks today No concerning  sx Recheck BP with nurse f/up visit or check at home and pt asked to notify us of average BP  2. Type 2 diabetes mellitus with hyperglycemia, without long-term current use of insulin ([facility]) Compliant with meds, last A1C at outside clinic in Aug over 7 Labs overdue here and previously ordered - blood glucose meter kit and supplies KIT; Dispense based on patient and insurance preference. Use daily prn for blood sugar monitoring for diabetes ICD-10-E11.65  Dispense: 1 each; Refill: 0  3. Need for hepatitis C screening test Labs previously ordered, will do today  4. Irregular menses Lighter menses, no pain or cramping, urine preg negative Monitor - can get GYN f/up if needed May be due to weight loss?  I do suspect she has PCOS - POCT urine pregnancy  5. Metabolic syndrome Labs previously ordered  6. Chronic idiopathic urticaria Double up on 2nd generation antihistamine daily, add pepcid bid, retry singulair - though she thinks is causes HA as a SE She probably needs to reestablish with allergist again - desloratadine (CLARINEX) 5 MG tablet; Take 1 tablet (5 mg total) by mouth daily.  Dispense: 30 tablet; Refill: 2 - loratadine (CLARITIN) 10 MG tablet; Take 1 tablet (10 mg total) by mouth daily.  Dispense: 90 tablet; Refill: 3 - montelukast (SINGULAIR) 10 MG tablet; Take 1 tablet (10 mg total) by mouth at bedtime.  Dispense: 30 tablet; Refill: 2  7. Hypercholesterolemia Labs due and ordered previously - not on statin, with DM she should be, we discussed medications today, she is very adverse to pills/meds  9. Rash and nonspecific skin eruption Suspected eczema rash to buttock -   continue hydration, can use topical steroid ointment BID prn  10. Allergic rhinitis, unspecified seasonality, unspecified trigger - azelastine (ASTELIN) 0.1 % nasal spray; Place 2 sprays into both nostrils 2 (two) times daily. Use in each nostril as directed  Dispense: 30 mL; Refill: 12 - loratadine (CLARITIN) 10  MG tablet; Take 1 tablet (10 mg total) by mouth daily.  Dispense: 90 tablet; Refill: 3 - montelukast (SINGULAIR) 10 MG tablet; Take 1 tablet (10 mg total) by mouth at bedtime.  Dispense: 30 tablet; Refill: 2  11. Bipolar affective disorder, remission status unspecified ([place]) Sx well controlled and managed with lamictal and seroquel - managed per psychiatrist  12. Morbid obesity with BMI of 40.0-44.9, adult ([place]) She is loosing weight, encouraged to continue working on diet and exercise I think adding a GLP-1 would be very beneficial for pt, but she is very hesitant   Labs ordered and done today  Urine micro, poc urine pregnancy Lipid panel, CMP, A1C, lipase, CBC with diff  Lab results reviewed - see result note: Results for orders placed or performed during the hospital encounter of 01/09/20  SARS Coronavirus 2 by RT PCR (hospital order, performed in [place] hospital lab) Nasopharyngeal Nasopharyngeal Swab   Specimen: Nasopharyngeal Swab  Result Value Ref Range   SARS Coronavirus 2 NEGATIVE NEGATIVE  Lipase, blood  Result Value Ref Range   Lipase 29 11 - 51 U/L  Comprehensive metabolic panel  Result Value Ref Range   Sodium 134 (L) 135 - 145 mmol/L   Potassium 3.8 3.5 - 5.1 mmol/L   Chloride 101 98 - 111 mmol/L   CO2 22 22 - 32 mmol/L   Glucose, Bld 180 (H) 70 - 99 mg/dL   BUN 6 6 - 20 mg/dL   Creatinine, Ser 0.62 0.44 - 1.00 mg/dL   Calcium 8.6 (L) 8.9 - 10.3 mg/dL   Total Protein 7.4 6.5 - 8.1 g/dL   Albumin 3.9 3.5 - 5.0 g/dL   AST 26 15 - 41 U/L   ALT 25 0 - 44 U/L   Alkaline Phosphatase 51 38 - 126 U/L   Total Bilirubin 0.7 0.3 - 1.2 mg/dL   GFR calc non Af Amer >60 >60 mL/min   GFR calc Af Amer >60 >60 mL/min   Anion gap 11 5 - 15  CBC  Result Value Ref Range   WBC 13.7 (H) 4.0 - 10.5 K/uL   RBC 4.47 3.87 - 5.11 MIL/uL   Hemoglobin 12.3 12.0 - 15.0 g/dL   HCT 36.9 36 - 46 %   MCV 82.6 80.0 - 100.0 fL   MCH 27.5 26.0 - 34.0 pg   MCHC 33.3 30.0 - 36.0 g/dL    RDW 12.8 11.5 - 15.5 %   Platelets 325 150 - 400 K/uL   nRBC 0.0 0.0 - 0.2 %  Urinalysis, Complete w Microscopic Urine, Clean Catch  Result Value Ref Range   Color, Urine YELLOW (A) YELLOW   APPearance HAZY (A) CLEAR   Specific Gravity, Urine 1.017 1.005 - 1.030   pH 5.0 5.0 - 8.0   Glucose, UA NEGATIVE NEGATIVE mg/dL   Hgb urine dipstick NEGATIVE NEGATIVE   Bilirubin Urine NEGATIVE NEGATIVE   Ketones, ur NEGATIVE NEGATIVE mg/dL   Protein, ur NEGATIVE NEGATIVE mg/dL   Nitrite NEGATIVE NEGATIVE   Leukocytes,Ua NEGATIVE NEGATIVE   RBC / HPF 0-5 0 - 5 RBC/hpf   WBC, UA 0-5 0 - 5 WBC/hpf   Bacteria, UA NONE SEEN NONE SEEN  Squamous Epithelial / LPF 0-5 0 - 5   Mucus PRESENT   hCG, quantitative, pregnancy  Result Value Ref Range   hCG, Beta Chain, Quant, S <1 <5 mIU/mL  Pregnancy, urine  Result Value Ref Range   Preg Test, Ur NEGATIVE NEGATIVE  Pregnancy, urine POC  Result Value Ref Range   Preg Test, Ur NEGATIVE NEGATIVE   4 month routine f/up    Delsa Grana, PA-C 04/18/20 10:17 AM

## 2020-04-19 ENCOUNTER — Encounter: Payer: Self-pay | Admitting: Family Medicine

## 2020-04-19 LAB — CBC WITH DIFFERENTIAL/PLATELET
Absolute Monocytes: 721 cells/uL (ref 200–950)
Basophils Absolute: 89 cells/uL (ref 0–200)
Basophils Relative: 1 %
Eosinophils Absolute: 516 cells/uL — ABNORMAL HIGH (ref 15–500)
Eosinophils Relative: 5.8 %
HCT: 37.5 % (ref 35.0–45.0)
Hemoglobin: 12.7 g/dL (ref 11.7–15.5)
Lymphs Abs: 2581 cells/uL (ref 850–3900)
MCH: 28 pg (ref 27.0–33.0)
MCHC: 33.9 g/dL (ref 32.0–36.0)
MCV: 82.8 fL (ref 80.0–100.0)
MPV: 9.7 fL (ref 7.5–12.5)
Monocytes Relative: 8.1 %
Neutro Abs: 4993 cells/uL (ref 1500–7800)
Neutrophils Relative %: 56.1 %
Platelets: 323 10*3/uL (ref 140–400)
RBC: 4.53 10*6/uL (ref 3.80–5.10)
RDW: 13.3 % (ref 11.0–15.0)
Total Lymphocyte: 29 %
WBC: 8.9 10*3/uL (ref 3.8–10.8)

## 2020-04-19 LAB — COMPLETE METABOLIC PANEL WITH GFR
AG Ratio: 1.7 (calc) (ref 1.0–2.5)
ALT: 21 U/L (ref 6–29)
AST: 19 U/L (ref 10–30)
Albumin: 4.5 g/dL (ref 3.6–5.1)
Alkaline phosphatase (APISO): 52 U/L (ref 31–125)
BUN: 11 mg/dL (ref 7–25)
CO2: 24 mmol/L (ref 20–32)
Calcium: 9.6 mg/dL (ref 8.6–10.2)
Chloride: 104 mmol/L (ref 98–110)
Creat: 0.63 mg/dL (ref 0.50–1.10)
GFR, Est African American: 133 mL/min/{1.73_m2} (ref >=60)
GFR, Est Non African American: 115 mL/min/{1.73_m2} (ref >=60)
Globulin: 2.7 g/dL (calc) (ref 1.9–3.7)
Glucose, Bld: 160 mg/dL — ABNORMAL HIGH (ref 65–99)
Potassium: 4.4 mmol/L (ref 3.5–5.3)
Sodium: 136 mmol/L (ref 135–146)
Total Bilirubin: 0.4 mg/dL (ref 0.2–1.2)
Total Protein: 7.2 g/dL (ref 6.1–8.1)

## 2020-04-19 LAB — HEMOGLOBIN A1C
Hgb A1c MFr Bld: 7.3 % of total Hgb — ABNORMAL HIGH (ref <5.7)
Mean Plasma Glucose: 163 (calc)
eAG (mmol/L): 9 (calc)

## 2020-04-19 LAB — LIPID PANEL
Cholesterol: 214 mg/dL — ABNORMAL HIGH (ref <200)
HDL: 38 mg/dL — ABNORMAL LOW (ref >=50)
LDL Cholesterol (Calc): 144 mg/dL (calc) — ABNORMAL HIGH
Non-HDL Cholesterol (Calc): 176 mg/dL (calc) — ABNORMAL HIGH (ref <130)
Total CHOL/HDL Ratio: 5.6 (calc) — ABNORMAL HIGH (ref <5.0)
Triglycerides: 181 mg/dL — ABNORMAL HIGH (ref <150)

## 2020-04-19 LAB — MICROALBUMIN, URINE: Microalb, Ur: 2.2 mg/dL

## 2020-04-19 LAB — LIPASE: Lipase: 15 U/L (ref 7–60)

## 2020-04-19 MED ORDER — AZELASTINE HCL 0.1 % NA SOLN: 2.0000 | Freq: Two times a day (BID) | NASAL | 12 refills | Status: DC

## 2020-04-19 MED ORDER — LORATADINE 10 MG PO TABS: 10.0000 mg | ORAL_TABLET | Freq: Every day | ORAL | 3 refills | Status: DC

## 2020-04-19 MED ORDER — METFORMIN HCL 1000 MG PO TABS: 1000.0000 mg | ORAL_TABLET | Freq: Two times a day (BID) | ORAL | 1 refills | Status: DC

## 2020-04-19 MED ORDER — MONTELUKAST SODIUM 10 MG PO TABS: 10.0000 mg | ORAL_TABLET | Freq: Every day | ORAL | 0 refills | Status: DC

## 2020-04-19 MED ORDER — BLOOD GLUCOSE MONITOR KIT: PACK | 0 refills | Status: DC

## 2020-04-21 MED ORDER — MONTELUKAST SODIUM 10 MG PO TABS: 10.0000 mg | ORAL_TABLET | Freq: Every day | ORAL | 2 refills | Status: DC

## 2020-05-19 ENCOUNTER — Ambulatory Visit: Payer: BC Managed Care – PPO | Admitting: Family Medicine

## 2020-06-28 ENCOUNTER — Encounter: Payer: Self-pay | Admitting: Family Medicine

## 2020-06-28 MED ORDER — FLUCONAZOLE 100 MG PO TABS: ORAL_TABLET | ORAL | 0 refills | Status: DC

## 2020-07-15 NOTE — Progress Notes (Deleted)
Name: Caitlin Gomez   MRN: [id number]    DOB: 1981/04/24   Date:07/15/2020       Progress Note  Subjective  Chief Complaint  Itching  HPI    Patient Active Problem List   Diagnosis Date Noted  . Irregular menses 05/04/2019  . Metabolic syndrome 56/38/9373  . Chronic idiopathic urticaria 05/04/2019  . Hypercholesterolemia 03/28/2018  . Morbid obesity with BMI of 40.0-44.9, adult ([place]) 09/05/2017  . History of diverticulitis 09/05/2017  . Incidental lung nodule 02/18/2017  . Allergic rhinitis 09/05/2016  . Fatty liver 06/10/2016  . Bipolar disorder ([place]) 03/05/2016  . Essential hypertension 03/05/2016  . Type 2 diabetes mellitus without complication, without long-term current use of insulin ([place]) 03/05/2016    Past Surgical History:  Procedure Laterality Date  . TONSILLECTOMY AND ADENOIDECTOMY      Family History  Problem Relation Age of Onset  . Alcohol abuse Mother   . Hyperlipidemia Mother   . Hypertension Mother   . Mental illness Mother   . Diabetes Mother   . Diabetes Father   . Mental illness Paternal Aunt   . Cancer Paternal Aunt        lung  . Early death Maternal Grandmother   . Mental illness Maternal Grandmother   . Stroke Paternal Grandfather   . Diabetes Paternal Grandfather   . Diabetes Paternal Grandmother     Social History   Tobacco Use  . Smoking status: Former Smoker    Types: Cigarettes  . Smokeless tobacco: Never Used  Substance Use Topics  . Alcohol use: Not Currently     Current Outpatient Medications:  .  albuterol (VENTOLIN HFA) 108 (90 Base) MCG/ACT inhaler, Inhale 2 puffs into the lungs every 6 (six) hours as needed for wheezing or shortness of breath., Disp: 18 g, Rfl: 1 .  azelastine (ASTELIN) 0.1 % nasal spray, Place 2 sprays into both nostrils 2 (two) times daily. Use in each nostril as directed, Disp: 30 mL, Rfl: 12 .  blood glucose meter kit and supplies KIT, Dispense based on patient and insurance preference. Use daily  prn for blood sugar monitoring for diabetes ICD-10-E11.65, Disp: 1 each, Rfl: 0 .  desloratadine (CLARINEX) 5 MG tablet, Take 1 tablet (5 mg total) by mouth daily., Disp: 30 tablet, Rfl: 2 .  famotidine (PEPCID) 20 MG tablet, Take 1 tablet (20 mg total) by mouth 2 (two) times daily., Disp: 60 tablet, Rfl: 3 .  fluconazole (DIFLUCAN) 100 MG tablet, Take 200 mg po daily x 1 d, then take 100 mg po daily x 13 d, Disp: 15 tablet, Rfl: 0 .  glucose blood test strip, Contour Next Test Strips - check CBG once daily, Disp: 100 each, Rfl: 3 .  hydrOXYzine (ATARAX/VISTARIL) 10 MG tablet, Take 10 mg by mouth 2 (two) times daily., Disp: , Rfl:  .  lamoTRIgine (LAMICTAL) 100 MG tablet, Take 250 mg by mouth daily. , Disp: , Rfl:  .  lisinopril (ZESTRIL) 10 MG tablet, Take 1 tablet (10 mg total) by mouth daily., Disp: 90 tablet, Rfl: 3 .  loratadine (CLARITIN) 10 MG tablet, Take 1 tablet (10 mg total) by mouth daily., Disp: 90 tablet, Rfl: 3 .  metFORMIN (GLUCOPHAGE) 1000 MG tablet, Take 1 tablet (1,000 mg total) by mouth 2 (two) times daily with a meal., Disp: 180 tablet, Rfl: 1 .  montelukast (SINGULAIR) 10 MG tablet, Take 1 tablet (10 mg total) by mouth at bedtime., Disp: 30 tablet, Rfl: 2 .  QUEtiapine (SEROQUEL) 50 MG tablet, Take 50 mg by mouth as needed. , Disp: , Rfl:  .  triamcinolone ointment (KENALOG) 0.1 %, Apply 1 application topically 2 (two) times daily as needed., Disp: 30 g, Rfl: 1  Allergies  Allergen Reactions  . Amoxicillin Other (See Comments)    Chest pain   . Clindamycin Swelling  . Kale     Tongue swelling  . Oysters [Shellfish Allergy]     Dizzy, nauseated  . Ziprasidone Hcl Swelling  . Amoxicillin-Pot Clavulanate Rash    I personally reviewed {Reviewed:14835} with the patient/caregiver today.   ROS  ***  Objective  There were no vitals filed for this visit.  There is no height or weight on file to calculate BMI.  Physical Exam ***  Recent Results (from the past  2160 hour(s))  Microalbumin, urine     Status: None   Collection Time: 04/18/20 10:51 AM  Result Value Ref Range   Microalb, Ur 2.2 mg/dL    Comment: Reference Range Not established    RAM      Comment: . The ADA defines abnormalities in albumin excretion as follows: Marland Kitchen Albuminuria Category         Result (mcg/mg creatinine) . Normal to Mildly increased    <30 Moderately increased          30-299  Severely increased            > OR = 300 . The ADA recommends that at least two of three specimens collected within a 3-6 month period be abnormal before considering a patient to be within a diagnostic category.   Lipid panel     Status: Abnormal   Collection Time: 04/18/20 10:51 AM  Result Value Ref Range   Cholesterol 214 (H) <200 mg/dL   HDL 38 (L) > OR = 50 mg/dL   Triglycerides 181 (H) <150 mg/dL   LDL Cholesterol (Calc) 144 (H) mg/dL (calc)    Comment: Reference range: <100 . Desirable range <100 mg/dL for primary prevention;   <70 mg/dL for patients with CHD or diabetic patients  with > or = 2 CHD risk factors. Marland Kitchen LDL-C is now calculated using the Martin-Hopkins  calculation, which is a validated novel method providing  better accuracy than the Friedewald equation in the  estimation of LDL-C.  Cresenciano Genre et al. Annamaria Helling. 6761;950(93): 2061-2068  (http://education.[facility].com/faq/FAQ164)    Total CHOL/HDL Ratio 5.6 (H) <5.0 (calc)   Non-HDL Cholesterol (Calc) 176 (H) <130 mg/dL (calc)    Comment: For patients with diabetes plus 1 major ASCVD risk  factor, treating to a non-HDL-C goal of <100 mg/dL  (LDL-C of <70 mg/dL) is considered a therapeutic  option.   COMPLETE METABOLIC PANEL WITH GFR     Status: Abnormal   Collection Time: 04/18/20 10:51 AM  Result Value Ref Range   Glucose, Bld 160 (H) 65 - 99 mg/dL    Comment: .            Fasting reference interval . For someone without known diabetes, a glucose value >125 mg/dL indicates that they may  have diabetes and this should be confirmed with a follow-up test. .    BUN 11 7 - 25 mg/dL   Creat 0.63 0.50 - 1.10 mg/dL   GFR, Est Non African American 115 > OR = 60 mL/min/1.61m2   GFR, Est African American 133 > OR = 60 mL/min/1.79m2   BUN/Creatinine Ratio NOT APPLICABLE 6 - 22 (calc)   Sodium  136 135 - 146 mmol/L   Potassium 4.4 3.5 - 5.3 mmol/L   Chloride 104 98 - 110 mmol/L   CO2 24 20 - 32 mmol/L   Calcium 9.6 8.6 - 10.2 mg/dL   Total Protein 7.2 6.1 - 8.1 g/dL   Albumin 4.5 3.6 - 5.1 g/dL   Globulin 2.7 1.9 - 3.7 g/dL (calc)   AG Ratio 1.7 1.0 - 2.5 (calc)   Total Bilirubin 0.4 0.2 - 1.2 mg/dL   Alkaline phosphatase (APISO) 52 31 - 125 U/L   AST 19 10 - 30 U/L   ALT 21 6 - 29 U/L  Hemoglobin A1c     Status: Abnormal   Collection Time: 04/18/20 10:51 AM  Result Value Ref Range   Hgb A1c MFr Bld 7.3 (H) <5.7 % of total Hgb    Comment: For someone without known diabetes, a hemoglobin A1c value of 6.5% or greater indicates that they may have  diabetes and this should be confirmed with a follow-up  test. . For someone with known diabetes, a value <7% indicates  that their diabetes is well controlled and a value  greater than or equal to 7% indicates suboptimal  control. A1c targets should be individualized based on  duration of diabetes, age, comorbid conditions, and  other considerations. . Currently, no consensus exists regarding use of hemoglobin A1c for diagnosis of diabetes for children. .    Mean Plasma Glucose 163 (calc)   eAG (mmol/L) 9.0 (calc)  Lipase     Status: None   Collection Time: 04/18/20 10:51 AM  Result Value Ref Range   Lipase 15 7 - 60 U/L  CBC with Differential/Platelet     Status: Abnormal   Collection Time: 04/18/20 10:51 AM  Result Value Ref Range   WBC 8.9 3.8 - 10.8 Thousand/uL   RBC 4.53 3.80 - 5.10 Million/uL   Hemoglobin 12.7 11.7 - 15.5 g/dL   HCT 37.5 35.0 - 45.0 %   MCV 82.8 80.0 - 100.0 fL   MCH 28.0 27.0 - 33.0 pg   MCHC  33.9 32.0 - 36.0 g/dL   RDW 13.3 11.0 - 15.0 %   Platelets 323 140 - 400 Thousand/uL   MPV 9.7 7.5 - 12.5 fL   Neutro Abs 4,993 1,500 - 7,800 cells/uL   Lymphs Abs 2,581 850 - 3,900 cells/uL   Absolute Monocytes 721 200 - 950 cells/uL   Eosinophils Absolute 516 (H) 15 - 500 cells/uL   Basophils Absolute 89 0 - 200 cells/uL   Neutrophils Relative % 56.1 %   Total Lymphocyte 29.0 %   Monocytes Relative 8.1 %   Eosinophils Relative 5.8 %   Basophils Relative 1.0 %  POCT urine pregnancy     Status: Normal   Collection Time: 04/18/20 11:48 AM  Result Value Ref Range   Preg Test, Ur Negative Negative    Diabetic Foot Exam: Diabetic Foot Exam - Simple   No data filed    ***  PHQ2/9: Depression screen [facility] 2/9 04/18/2020 01/18/2020 10/27/2019 07/01/2019 06/17/2019  Decreased Interest 1 0 0 0 0  Down, Depressed, Hopeless 1 0 0 1 1  PHQ - 2 Score 2 0 0 1 1  Altered sleeping 0 1 1 0 1  Tired, decreased energy 1 0 $R'1 1 1  'Ye$ Change in appetite 1 0 0 1 1  Feeling bad or failure about yourself  0 0 0 1 0  Trouble concentrating 0 0 0 0 2  Moving slowly  or fidgety/restless 0 0 0 0 0  Suicidal thoughts 0 0 0 0 0  PHQ-9 Score $RemoveBef'4 1 2 4 6  'YQSTRFAIgv$ Difficult doing work/chores Not difficult at all Not difficult at all Not difficult at all Not difficult at all Somewhat difficult  Some recent data might be hidden    phq 9 is {gen pos [id number]} ***  Fall Risk: Fall Risk  01/18/2020 10/27/2019 07/01/2019 06/17/2019 04/29/2019  Falls in the past year? 0 0 0 0 0  Number falls in past yr: 0 0 0 0 0  Injury with Fall? 0 0 0 0 0  Follow up Falls evaluation completed - - - -   ***   Functional Status Survey:   ***   Assessment & Plan  *** There are no diagnoses linked to this encounter.

## 2020-07-18 ENCOUNTER — Other Ambulatory Visit: Payer: Self-pay

## 2020-07-18 ENCOUNTER — Encounter: Payer: Self-pay | Admitting: Family Medicine

## 2020-07-18 ENCOUNTER — Ambulatory Visit: Payer: BC Managed Care – PPO | Admitting: Family Medicine

## 2020-07-18 VITALS — BP 112/72 | HR 87 | Temp 98.4°F | Resp 18 | Ht 66.0 in | Wt 257.5 lb

## 2020-07-18 DIAGNOSIS — L509 Urticaria, unspecified: Secondary | ICD-10-CM | POA: Diagnosis not present

## 2020-07-18 DIAGNOSIS — L299 Pruritus, unspecified: Secondary | ICD-10-CM

## 2020-07-18 DIAGNOSIS — Z1159 Encounter for screening for other viral diseases: Secondary | ICD-10-CM

## 2020-07-18 DIAGNOSIS — E1165 Type 2 diabetes mellitus with hyperglycemia: Secondary | ICD-10-CM

## 2020-07-18 DIAGNOSIS — I10 Essential (primary) hypertension: Secondary | ICD-10-CM

## 2020-07-18 DIAGNOSIS — E7800 Pure hypercholesterolemia, unspecified: Secondary | ICD-10-CM

## 2020-07-18 DIAGNOSIS — Z5181 Encounter for therapeutic drug level monitoring: Secondary | ICD-10-CM

## 2020-07-18 MED ORDER — PREDNISONE 10 MG PO TABS: ORAL_TABLET | ORAL | 0 refills | Status: DC

## 2020-07-18 NOTE — Progress Notes (Signed)
Name: Caitlin Gomez   MRN: [id number]    DOB: 11-03-1980   Date:07/18/2020       Progress Note  Chief Complaint  Patient presents with  . itching    Several areas     Subjective:   Caitlin Gomez is a 40 y.o. female, presents to clinic for itching, rash hives She has a history of severe seasonal and environmental allergies, but was previously managed by specialist, was on allergy shots and special medication, for several weeks and months she has had worsening diffuse rash and itching no new soaps detergents or medications.  She has been taking Claritin and sometimes Pepcid even with good compliance of these medications mostly daily she has not had any improvement in her rash (she has Singulair but may be out of medication) She previously was on Clarinex from the allergist but it was expensive.  Previously was given hydroxyzine to try but that made her too sleepy even with a low dose.  She has been trying Benadryl as well  She is overdue for regular follow-up on multiple medications and blood work for diabetes, hypertension and monitoring of her other medications which psych Kia tree manages including her Seroquel, propranolol and Lamictal     Current Outpatient Medications:  .  albuterol (VENTOLIN HFA) 108 (90 Base) MCG/ACT inhaler, Inhale 2 puffs into the lungs every 6 (six) hours as needed for wheezing or shortness of breath., Disp: 18 g, Rfl: 1 .  azelastine (ASTELIN) 0.1 % nasal spray, Place 2 sprays into both nostrils 2 (two) times daily. Use in each nostril as directed, Disp: 30 mL, Rfl: 12 .  blood glucose meter kit and supplies KIT, Dispense based on patient and insurance preference. Use daily prn for blood sugar monitoring for diabetes ICD-10-E11.65, Disp: 1 each, Rfl: 0 .  desloratadine (CLARINEX) 5 MG tablet, Take 1 tablet (5 mg total) by mouth daily., Disp: 30 tablet, Rfl: 2 .  famotidine (PEPCID) 20 MG tablet, Take 1 tablet (20 mg total) by mouth 2 (two) times daily.,  Disp: 60 tablet, Rfl: 3 .  fluconazole (DIFLUCAN) 100 MG tablet, Take 200 mg po daily x 1 d, then take 100 mg po daily x 13 d, Disp: 15 tablet, Rfl: 0 .  glucose blood test strip, Contour Next Test Strips - check CBG once daily, Disp: 100 each, Rfl: 3 .  hydrOXYzine (ATARAX/VISTARIL) 10 MG tablet, Take 10 mg by mouth 2 (two) times daily., Disp: , Rfl:  .  lamoTRIgine (LAMICTAL) 100 MG tablet, Take 250 mg by mouth daily. , Disp: , Rfl:  .  lisinopril (ZESTRIL) 10 MG tablet, Take 1 tablet (10 mg total) by mouth daily., Disp: 90 tablet, Rfl: 3 .  loratadine (CLARITIN) 10 MG tablet, Take 1 tablet (10 mg total) by mouth daily., Disp: 90 tablet, Rfl: 3 .  metFORMIN (GLUCOPHAGE) 1000 MG tablet, Take 1 tablet (1,000 mg total) by mouth 2 (two) times daily with a meal., Disp: 180 tablet, Rfl: 1 .  montelukast (SINGULAIR) 10 MG tablet, Take 1 tablet (10 mg total) by mouth at bedtime., Disp: 30 tablet, Rfl: 2 .  QUEtiapine (SEROQUEL) 50 MG tablet, Take 50 mg by mouth as needed. , Disp: , Rfl:  .  triamcinolone ointment (KENALOG) 0.1 %, Apply 1 application topically 2 (two) times daily as needed., Disp: 30 g, Rfl: 1  Patient Active Problem List   Diagnosis Date Noted  . Irregular menses 05/04/2019  . Metabolic syndrome 37/54/3606  . Chronic  idiopathic urticaria 05/04/2019  . Hypercholesterolemia 03/28/2018  . Morbid obesity with BMI of 40.0-44.9, adult ([place]) 09/05/2017  . History of diverticulitis 09/05/2017  . Incidental lung nodule 02/18/2017  . Allergic rhinitis 09/05/2016  . Fatty liver 06/10/2016  . Bipolar disorder ([place]) 03/05/2016  . Essential hypertension 03/05/2016  . Type 2 diabetes mellitus without complication, without long-term current use of insulin ([place]) 03/05/2016    Past Surgical History:  Procedure Laterality Date  . TONSILLECTOMY AND ADENOIDECTOMY      Family History  Problem Relation Age of Onset  . Alcohol abuse Mother   . Hyperlipidemia Mother   . Hypertension Mother    . Mental illness Mother   . Diabetes Mother   . Diabetes Father   . Mental illness Paternal Aunt   . Cancer Paternal Aunt        lung  . Early death Maternal Grandmother   . Mental illness Maternal Grandmother   . Stroke Paternal Grandfather   . Diabetes Paternal Grandfather   . Diabetes Paternal Grandmother     Social History   Tobacco Use  . Smoking status: Former Smoker    Types: Cigarettes  . Smokeless tobacco: Never Used  Vaping Use  . Vaping Use: Never used  Substance Use Topics  . Alcohol use: Not Currently  . Drug use: No     Allergies  Allergen Reactions  . Amoxicillin Other (See Comments)    Chest pain   . Clindamycin Swelling  . Oysters [Shellfish Allergy]     Dizzy, nauseated  . Ziprasidone Hcl Swelling  . Amoxicillin-Pot Clavulanate Rash    Health Maintenance  Topic Date Due  . Hepatitis C Screening  Never done  . FOOT EXAM  03/29/2019  . OPHTHALMOLOGY EXAM  01/27/2020  . COVID-19 Vaccine (3 - Booster for Pfizer series) 03/07/2020  . PAP SMEAR-Modifier  07/13/2020  . HEMOGLOBIN A1C  10/16/2020  . TETANUS/TDAP  03/11/2024  . INFLUENZA VACCINE  Completed  . PNEUMOCOCCAL POLYSACCHARIDE VACCINE AGE 68-64 HIGH RISK  Completed  . HIV Screening  Completed    Chart Review Today: I personally reviewed active problem list, medication list, allergies, family history, social history, health maintenance, notes from last encounter, lab results, imaging with the patient/caregiver today.   Review of Systems  Constitutional: Negative.   HENT: Negative.   Eyes: Negative.   Respiratory: Negative.   Cardiovascular: Negative.   Gastrointestinal: Negative.   Endocrine: Negative.   Genitourinary: Negative.   Musculoskeletal: Negative.   Skin: Negative.   Allergic/Immunologic: Negative.   Neurological: Negative.   Hematological: Negative.   Psychiatric/Behavioral: Negative.   All other systems reviewed and are negative.    Objective:   Vitals:    07/18/20 1032  BP: 112/72  Pulse: 87  Resp: 18  Temp: 98.4 F (36.9 C)  SpO2: 99%  Weight: 257 lb 8 oz (116.8 kg)  Height: 5' 6" (1.676 m)    Body mass index is 41.56 kg/m.  Physical Exam Vitals and nursing note reviewed.  Constitutional:      General: She is not in acute distress.    Appearance: Normal appearance. She is well-developed. She is obese. She is not ill-appearing, toxic-appearing or diaphoretic.     Interventions: Face mask in place.  HENT:     Head: Normocephalic and atraumatic.     Nose: Nose normal.  Eyes:     General:        Right eye: No discharge.  Left eye: No discharge.     Conjunctiva/sclera: Conjunctivae normal.  Neck:     Trachea: No tracheal deviation.  Cardiovascular:     Rate and Rhythm: Normal rate and regular rhythm.  Pulmonary:     Effort: Pulmonary effort is normal. No respiratory distress.     Breath sounds: No stridor.  Musculoskeletal:        General: Normal range of motion.  Skin:    General: Skin is warm and dry.     Findings: No rash.  Neurological:     Mental Status: She is alert.     Motor: No abnormal muscle tone.     Coordination: Coordination normal.  Psychiatric:        Behavior: Behavior normal. Behavior is cooperative.         Assessment & Plan:     ICD-10-CM   1. Hives  L50.9 CBC with Differential/Platelet    TSH    Ambulatory referral to Allergy   see below, hx of allergies, maxed out on antihistamines, restart singulair, hydrate, steroid taper to manage flare  2. Pruritus  L29.9 CBC with Differential/Platelet    TSH    Ambulatory referral to Allergy   itching and rash - some located to buttock area/groin - pt refused to allow me to examine - offered allergiest vs derm referral - no improvement w mulitple meds  3. Essential hypertension  J69 COMPLETE METABOLIC PANEL WITH GFR   Blood pressure at goal today, due for labs  4. Type 2 diabetes mellitus with hyperglycemia, without long-term current use of  insulin ([facility])  C78.93 COMPLETE METABOLIC PANEL WITH GFR    Hemoglobin A1c   Uncontrolled, patient has been resistant to adding medications but she has not been able to improve with diet and Metformin due for labs  5. Hypercholesterolemia  Y10.17 COMPLETE METABOLIC PANEL WITH GFR    Lipid panel  6. Encounter for hepatitis C screening test for low risk patient  Z11.59 Hepatitis C antibody  7. Encounter for medication monitoring  Z51.81 CBC with Differential/Platelet    COMPLETE METABOLIC PANEL WITH GFR    Lipid panel    Hemoglobin A1c     Return for keep your next routine f/up appt.   Delsa Grana, PA-C 07/18/20 10:34 AM

## 2020-07-19 LAB — CBC WITH DIFFERENTIAL/PLATELET
Absolute Monocytes: 581 cells/uL (ref 200–950)
Basophils Absolute: 88 cells/uL (ref 0–200)
Basophils Relative: 1 %
Eosinophils Absolute: 1135 cells/uL — ABNORMAL HIGH (ref 15–500)
Eosinophils Relative: 12.9 %
HCT: 37.1 % (ref 35.0–45.0)
Hemoglobin: 12.6 g/dL (ref 11.7–15.5)
Lymphs Abs: 2746 cells/uL (ref 850–3900)
MCH: 27.6 pg (ref 27.0–33.0)
MCHC: 34 g/dL (ref 32.0–36.0)
MCV: 81.4 fL (ref 80.0–100.0)
MPV: 9.8 fL (ref 7.5–12.5)
Monocytes Relative: 6.6 %
Neutro Abs: 4250 cells/uL (ref 1500–7800)
Neutrophils Relative %: 48.3 %
Platelets: 299 10*3/uL (ref 140–400)
RBC: 4.56 10*6/uL (ref 3.80–5.10)
RDW: 13.6 % (ref 11.0–15.0)
Total Lymphocyte: 31.2 %
WBC: 8.8 10*3/uL (ref 3.8–10.8)

## 2020-07-19 LAB — COMPLETE METABOLIC PANEL WITH GFR
AG Ratio: 1.4 (calc) (ref 1.0–2.5)
ALT: 25 U/L (ref 6–29)
AST: 19 U/L (ref 10–30)
Albumin: 4.2 g/dL (ref 3.6–5.1)
Alkaline phosphatase (APISO): 52 U/L (ref 31–125)
BUN: 7 mg/dL (ref 7–25)
CO2: 26 mmol/L (ref 20–32)
Calcium: 9.3 mg/dL (ref 8.6–10.2)
Chloride: 104 mmol/L (ref 98–110)
Creat: 0.67 mg/dL (ref 0.50–1.10)
GFR, Est African American: 130 mL/min/{1.73_m2} (ref >=60)
GFR, Est Non African American: 112 mL/min/{1.73_m2} (ref >=60)
Globulin: 2.9 g/dL (calc) (ref 1.9–3.7)
Glucose, Bld: 148 mg/dL — ABNORMAL HIGH (ref 65–99)
Potassium: 4.2 mmol/L (ref 3.5–5.3)
Sodium: 138 mmol/L (ref 135–146)
Total Bilirubin: 0.4 mg/dL (ref 0.2–1.2)
Total Protein: 7.1 g/dL (ref 6.1–8.1)

## 2020-07-19 LAB — LIPID PANEL
Cholesterol: 209 mg/dL — ABNORMAL HIGH (ref <200)
HDL: 44 mg/dL — ABNORMAL LOW (ref >=50)
LDL Cholesterol (Calc): 133 mg/dL (calc) — ABNORMAL HIGH
Non-HDL Cholesterol (Calc): 165 mg/dL (calc) — ABNORMAL HIGH (ref <130)
Total CHOL/HDL Ratio: 4.8 (calc) (ref <5.0)
Triglycerides: 185 mg/dL — ABNORMAL HIGH (ref <150)

## 2020-07-19 LAB — TSH: TSH: 2.44 mIU/L

## 2020-07-19 LAB — HEMOGLOBIN A1C
Hgb A1c MFr Bld: 7.8 % of total Hgb — ABNORMAL HIGH (ref <5.7)
Mean Plasma Glucose: 177 mg/dL
eAG (mmol/L): 9.8 mmol/L

## 2020-07-19 LAB — HEPATITIS C ANTIBODY
Hepatitis C Ab: NONREACTIVE
SIGNAL TO CUT-OFF: 0.02 (ref <1.00)

## 2020-08-01 ENCOUNTER — Ambulatory Visit: Payer: BC Managed Care – PPO | Admitting: Family Medicine

## 2020-08-15 ENCOUNTER — Encounter: Payer: Self-pay | Admitting: Family Medicine

## 2020-08-26 ENCOUNTER — Telehealth: Payer: Self-pay

## 2020-08-26 ENCOUNTER — Other Ambulatory Visit: Payer: Self-pay | Admitting: Family Medicine

## 2020-08-26 DIAGNOSIS — E1165 Type 2 diabetes mellitus with hyperglycemia: Secondary | ICD-10-CM

## 2020-08-26 NOTE — Telephone Encounter (Signed)
Left vm with pharmacy

## 2020-08-26 NOTE — Telephone Encounter (Signed)
Copied from [place] [id number]. Topic: General - Other >> Aug 26, 2020 11:02 AM Pawlus, Brayton Layman A wrote: Reason for CRM: Pharmacy called needing clarification regarding ONETOUCH VERIO test strip. Pharmacist needed to know how many times a day the Pt will be testing. Please advise.

## 2020-10-24 ENCOUNTER — Ambulatory Visit: Payer: BC Managed Care – PPO | Admitting: Family Medicine

## 2020-10-24 ENCOUNTER — Telehealth: Payer: Self-pay

## 2020-10-24 NOTE — Telephone Encounter (Signed)
Labs was going to  be repeated today. CBC, A1c. Can she still come in for that since it was elevated or do she need to wait

## 2020-10-24 NOTE — Telephone Encounter (Signed)
Pt has an appt with Delsa Grana today and her appt had to be cancelled due to [facility] not being in the office. Pt states she was only supposed to get labs done today. Pt would like for her labs to be ordered. Please advise

## 2020-10-25 NOTE — Telephone Encounter (Signed)
appt resch'd

## 2020-10-25 NOTE — Telephone Encounter (Signed)
Please call and reschedule her appointment with Leisa to repeat labs. It is ok to have done at that time

## 2020-11-07 ENCOUNTER — Ambulatory Visit: Payer: BC Managed Care – PPO | Admitting: Family Medicine

## 2020-11-15 ENCOUNTER — Encounter: Payer: Self-pay | Admitting: Unknown Physician Specialty

## 2020-11-15 ENCOUNTER — Ambulatory Visit: Payer: BC Managed Care – PPO | Admitting: Unknown Physician Specialty

## 2020-11-15 ENCOUNTER — Other Ambulatory Visit: Payer: Self-pay

## 2020-11-15 VITALS — BP 124/78 | HR 80 | Temp 98.2°F | Resp 16 | Ht 66.0 in | Wt 249.3 lb

## 2020-11-15 DIAGNOSIS — D721 Eosinophilia, unspecified: Secondary | ICD-10-CM

## 2020-11-15 DIAGNOSIS — E7800 Pure hypercholesterolemia, unspecified: Secondary | ICD-10-CM

## 2020-11-15 DIAGNOSIS — E1165 Type 2 diabetes mellitus with hyperglycemia: Secondary | ICD-10-CM

## 2020-11-15 DIAGNOSIS — F319 Bipolar disorder, unspecified: Secondary | ICD-10-CM

## 2020-11-15 DIAGNOSIS — Z6841 Body Mass Index (BMI) 40.0 and over, adult: Secondary | ICD-10-CM

## 2020-11-15 DIAGNOSIS — I10 Essential (primary) hypertension: Secondary | ICD-10-CM

## 2020-11-15 DIAGNOSIS — E119 Type 2 diabetes mellitus without complications: Secondary | ICD-10-CM

## 2020-11-15 LAB — POCT GLYCOSYLATED HEMOGLOBIN (HGB A1C): Hemoglobin A1C: 7.2 % — AB (ref 4.0–5.6)

## 2020-11-15 NOTE — Assessment & Plan Note (Signed)
Lost 8 pounds from last visit due to lifestyle changes.

## 2020-11-15 NOTE — Assessment & Plan Note (Signed)
Check levels today.  If elevated willing to go on statin

## 2020-11-15 NOTE — Assessment & Plan Note (Signed)
Stable, continue present medications.   

## 2020-11-15 NOTE — Assessment & Plan Note (Signed)
Hgb A1C is 7.2.  Down from 7.8.  Recheck in 3 months and consider GLP1 if weight loss attempts level off

## 2020-11-15 NOTE — Assessment & Plan Note (Signed)
Per psychiatry 

## 2020-11-15 NOTE — Progress Notes (Signed)
BP 124/78   Pulse 80   Temp 98.2 F (36.8 C) (Oral)   Resp 16   Ht 5\' 6"  (1.676 m)   Wt 249 lb 4.8 oz (113.1 kg)   LMP 11/03/2020   SpO2 99%   BMI 40.24 kg/m    Subjective:    Patient ID: Caitlin Gomez, female    DOB: [birth year], 40 y.o.   MRN: [id number]  HPI: Caitlin Gomez is a 40 y.o. female  Chief Complaint  Patient presents with   Diabetes   Hypertension   Diabetes: Using medications without difficulties No hypoglycemic episodes No hyperglycemic episodes Feet problems: Blood Sugars averaging:130 at home eye exam within last year 2 months ago Last Hgb A1C: 7.8  Hypertension  On Ace: Using medications without difficulty Average home BPs   Using medication without problems or lightheadedness No chest pain with exertion or shortness of breath No Edema  Elevated Cholesterol Using medications without problems No Muscle aches  Diet: Working hard by focusing on decreasing calories and healthier substitutions Exercise: Walking over a mile/day  Hypereosinophilia Noted on CBC last visit and here for recheck.  Appt with allergist indicates no reason for elevated levels.  Psychiatrist thinks perhaps related to Lithium  Depression screen [facility] 2/9 11/15/2020 07/18/2020 04/18/2020 01/18/2020 10/27/2019  Decreased Interest 1 2 1  0 0  Down, Depressed, Hopeless 1 2 1  0 0  PHQ - 2 Score 2 4 2  0 0  Altered sleeping 0 0 0 1 1  Tired, decreased energy 0 1 1 0 1  Change in appetite 0 2 1 0 0  Feeling bad or failure about yourself  0 1 0 0 0  Trouble concentrating 1 1 0 0 0  Moving slowly or fidgety/restless 0 0 0 0 0  Suicidal thoughts 0 0 0 0 0  PHQ-9 Score 3 9 4 1 2   Difficult doing work/chores Not difficult at all Somewhat difficult Not difficult at all Not difficult at all Not difficult at all  Some recent data might be hidden     The ASCVD Risk score Mikey Bussing DC Jr., et al., 2013) failed to calculate for the following reasons:   The 2013 ASCVD risk score is only  valid for ages 64 to 63   Relevant past medical, surgical, family and social history reviewed and updated as indicated. Interim medical history since our last visit reviewed. Allergies and medications reviewed and updated.  Review of Systems  Per HPI unless specifically indicated above     Objective:    BP 124/78   Pulse 80   Temp 98.2 F (36.8 C) (Oral)   Resp 16   Ht 5\' 6"  (1.676 m)   Wt 249 lb 4.8 oz (113.1 kg)   LMP 11/03/2020   SpO2 99%   BMI 40.24 kg/m   Wt Readings from Last 3 Encounters:  11/15/20 249 lb 4.8 oz (113.1 kg)  07/18/20 257 lb 8 oz (116.8 kg)  04/18/20 253 lb 11.2 oz (115.1 kg)    Physical Exam Constitutional:      General: She is not in acute distress.    Appearance: Normal appearance. She is well-developed.  HENT:     Head: Normocephalic and atraumatic.  Eyes:     General: Lids are normal. No scleral icterus.       Right eye: No discharge.        Left eye: No discharge.     Conjunctiva/sclera: Conjunctivae normal.  Neck:  Vascular: No carotid bruit or JVD.  Cardiovascular:     Rate and Rhythm: Normal rate and regular rhythm.     Heart sounds: Normal heart sounds.  Pulmonary:     Effort: Pulmonary effort is normal. No respiratory distress.     Breath sounds: Normal breath sounds.  Abdominal:     Palpations: There is no hepatomegaly or splenomegaly.  Musculoskeletal:        General: Normal range of motion.     Cervical back: Normal range of motion and neck supple.  Skin:    General: Skin is warm and dry.     Coloration: Skin is not pale.     Findings: No rash.  Neurological:     Mental Status: She is alert and oriented to person, place, and time.  Psychiatric:        Behavior: Behavior normal.        Thought Content: Thought content normal.        Judgment: Judgment normal.   Diabetic Foot Exam - Simple   Simple Foot Form Visual Inspection No deformities, no ulcerations, no other skin breakdown bilaterally: Yes Sensation  Testing Intact to touch and monofilament testing bilaterally: Yes Pulse Check Posterior Tibialis and Dorsalis pulse intact bilaterally: Yes Comments      Results for orders placed or performed in visit on 11/15/20  POCT HgB A1C  Result Value Ref Range   Hemoglobin A1C 7.2 (A) 4.0 - 5.6 %   HbA1c POC (<> result, manual entry)     HbA1c, POC (prediabetic range)     HbA1c, POC (controlled diabetic range)        Assessment & Plan:   Problem List Items Addressed This Visit       Unprioritized   Bipolar disorder [facility])    Per psychiatry       Essential hypertension    Stable, continue present medications.         Hypercholesterolemia    Check levels today.  If elevated willing to go on statin       Relevant Orders   Lipid panel   Morbid obesity with BMI of 40.0-44.9, adult ([place])    Lost 8 pounds from last visit due to lifestyle changes.         Type 2 diabetes mellitus without complication, without long-term current use of insulin (HCC)    Hgb A1C is 7.2.  Down from 7.8.  Recheck in 3 months and consider GLP1 if weight loss attempts level off       Other Visit Diagnoses     Type 2 diabetes mellitus with hyperglycemia, without long-term current use of insulin (HCC)    -  Primary   Relevant Orders   POCT HgB A1C (Completed)   Comprehensive metabolic panel   Eosinophilia, unspecified type       elevated levels last visit.  Recheck.  Consider referral to allergist.  Check alpha gal as stomach and hives improved after avoiding red meat   Relevant Orders   CBC with Differential/Platelet   Alpha-Gal Panel        Follow up plan: Return in about 3 months (around 02/15/2021).

## 2020-11-21 LAB — COMPREHENSIVE METABOLIC PANEL
AG Ratio: 1.9 (calc) (ref 1.0–2.5)
ALT: 12 U/L (ref 6–29)
AST: 12 U/L (ref 10–30)
Albumin: 4.5 g/dL (ref 3.6–5.1)
Alkaline phosphatase (APISO): 41 U/L (ref 31–125)
BUN: 11 mg/dL (ref 7–25)
CO2: 22 mmol/L (ref 20–32)
Calcium: 9.3 mg/dL (ref 8.6–10.2)
Chloride: 106 mmol/L (ref 98–110)
Creat: 0.68 mg/dL (ref 0.50–1.10)
Globulin: 2.4 g/dL (calc) (ref 1.9–3.7)
Glucose, Bld: 156 mg/dL — ABNORMAL HIGH (ref 65–99)
Potassium: 4.6 mmol/L (ref 3.5–5.3)
Sodium: 137 mmol/L (ref 135–146)
Total Bilirubin: 0.4 mg/dL (ref 0.2–1.2)
Total Protein: 6.9 g/dL (ref 6.1–8.1)

## 2020-11-21 LAB — CBC WITH DIFFERENTIAL/PLATELET
Absolute Monocytes: 494 cells/uL (ref 200–950)
Basophils Absolute: 72 cells/uL (ref 0–200)
Basophils Relative: 1.1 %
Eosinophils Absolute: 221 cells/uL (ref 15–500)
Eosinophils Relative: 3.4 %
HCT: 37.3 % (ref 35.0–45.0)
Hemoglobin: 12.1 g/dL (ref 11.7–15.5)
Lymphs Abs: 2093 cells/uL (ref 850–3900)
MCH: 27.2 pg (ref 27.0–33.0)
MCHC: 32.4 g/dL (ref 32.0–36.0)
MCV: 83.8 fL (ref 80.0–100.0)
MPV: 9.7 fL (ref 7.5–12.5)
Monocytes Relative: 7.6 %
Neutro Abs: 3621 cells/uL (ref 1500–7800)
Neutrophils Relative %: 55.7 %
Platelets: 295 10*3/uL (ref 140–400)
RBC: 4.45 10*6/uL (ref 3.80–5.10)
RDW: 13.4 % (ref 11.0–15.0)
Total Lymphocyte: 32.2 %
WBC: 6.5 10*3/uL (ref 3.8–10.8)

## 2020-11-21 LAB — ALPHA-GAL PANEL
Beef IgE: <0.1 kU/L (ref <0.35)
Class: 0
Class: 0
Class: 0
Galactose-alpha-1,3-galactose IgE: <0.1 kU/L (ref <0.10)
LAMB/MUTTON IGE: <0.1 kU/L (ref <0.35)
Pork IgE: <0.1 kU/L (ref <0.35)

## 2020-11-21 LAB — LIPID PANEL
Cholesterol: 179 mg/dL (ref <200)
HDL: 39 mg/dL — ABNORMAL LOW (ref >=50)
LDL Cholesterol (Calc): 111 mg/dL (calc) — ABNORMAL HIGH
Non-HDL Cholesterol (Calc): 140 mg/dL (calc) — ABNORMAL HIGH (ref <130)
Total CHOL/HDL Ratio: 4.6 (calc) (ref <5.0)
Triglycerides: 174 mg/dL — ABNORMAL HIGH (ref <150)

## 2020-12-01 ENCOUNTER — Ambulatory Visit (INDEPENDENT_AMBULATORY_CARE_PROVIDER_SITE_OTHER): Payer: BC Managed Care – PPO | Admitting: Family Medicine

## 2020-12-01 ENCOUNTER — Encounter: Payer: Self-pay | Admitting: Family Medicine

## 2020-12-01 ENCOUNTER — Other Ambulatory Visit (HOSPITAL_COMMUNITY)
Admission: RE | Admit: 2020-12-01 | Discharge: 2020-12-01 | Disposition: A | Discharge status: Home / Self Care | Payer: BC Managed Care – PPO | Source: Ambulatory Visit | Attending: Family Medicine | Admitting: Family Medicine

## 2020-12-01 ENCOUNTER — Other Ambulatory Visit: Payer: Self-pay

## 2020-12-01 VITALS — BP 142/81 | HR 79 | Ht 66.0 in | Wt 244.4 lb

## 2020-12-01 DIAGNOSIS — Z01419 Encounter for gynecological examination (general) (routine) without abnormal findings: Secondary | ICD-10-CM | POA: Diagnosis not present

## 2020-12-01 DIAGNOSIS — Z124 Encounter for screening for malignant neoplasm of cervix: Secondary | ICD-10-CM

## 2020-12-01 NOTE — Progress Notes (Signed)
Subjective:     Caitlin Gomez is a 40 y.o. female and is here for a comprehensive physical exam. The patient reports problems - some vulvar itching . Does not want more kids. Considering Vasectomy. She reports regular cycles with weight loss. No h/o abnormal pap.  The following portions of the patient's history were reviewed and updated as appropriate: allergies, current medications, past family history, past medical history, past social history, past surgical history, and problem list.  Review of Systems Pertinent items noted in HPI and remainder of comprehensive ROS otherwise negative.   Objective:    BP (!) 142/81   Pulse 79   Ht 5\' 6"  (1.676 m)   Wt 244 lb 6.4 oz (110.9 kg)   LMP 11/03/2020   BMI 39.45 kg/m  General appearance: alert, cooperative, and appears stated age Head: Normocephalic, without obvious abnormality, atraumatic Neck: no adenopathy, supple, symmetrical, trachea midline, and thyroid not enlarged, symmetric, no tenderness/mass/nodules Lungs: clear to auscultation bilaterally Breasts: normal appearance, no masses or tenderness Heart: regular rate and rhythm, S1, S2 normal, no murmur, click, rub or gallop Abdomen: soft, non-tender; bowel sounds normal; no masses,  no organomegaly Pelvic: cervix normal in appearance, external genitalia normal, no adnexal masses or tenderness, no cervical motion tenderness, uterus normal size, shape, and consistency, and vagina normal without discharge Extremities: extremities normal, atraumatic, no cyanosis or edema Pulses: 2+ and symmetric Skin: Skin color, texture, turgor normal. No rashes or lesions Lymph nodes: Cervical, supraclavicular, and axillary nodes normal. Neurologic: Grossly normal    Assessment:    Healthy female exam.      Plan:  Encounter for gynecological examination without abnormal finding  Screening for malignant neoplasm of cervix - Plan: Cytology - PAP  Return in 1 year (on 12/01/2021).    See After  Visit Summary for Counseling Recommendations

## 2020-12-01 NOTE — Patient Instructions (Signed)
Preventive Care 21-39 Years Old, Female Preventive care refers to lifestyle choices and visits with your health care provider that can promote health and wellness. This includes: A yearly physical exam. This is also called an annual wellness visit. Regular dental and eye exams. Immunizations. Screening for certain conditions. Healthy lifestyle choices, such as: Eating a healthy diet. Getting regular exercise. Not using drugs or products that contain nicotine and tobacco. Limiting alcohol use. What can I expect for my preventive care visit? Physical exam Your health care provider may check your: Height and weight. These may be used to calculate your BMI (body mass index). BMI is a measurement that tells if you are at a healthy weight. Heart rate and blood pressure. Body temperature. Skin for abnormal spots. Counseling Your health care provider may ask you questions about your: Past medical problems. Family's medical history. Alcohol, tobacco, and drug use. Emotional well-being. Home life and relationship well-being. Sexual activity. Diet, exercise, and sleep habits. Work and work environment. Access to firearms. Method of birth control. Menstrual cycle. Pregnancy history. What immunizations do I need?  Vaccines are usually given at various ages, according to a schedule. Your health care provider will recommend vaccines for you based on your age, medicalhistory, and lifestyle or other factors, such as travel or where you work. What tests do I need?  Blood tests Lipid and cholesterol levels. These may be checked every 5 years starting at age 20. Hepatitis C test. Hepatitis B test. Screening Diabetes screening. This is done by checking your blood sugar (glucose) after you have not eaten for a while (fasting). STD (sexually transmitted disease) testing, if you are at risk. BRCA-related cancer screening. This may be done if you have a family history of breast, ovarian, tubal, or  peritoneal cancers. Pelvic exam and Pap test. This may be done every 3 years starting at age 21. Starting at age 30, this may be done every 5 years if you have a Pap test in combination with an HPV test. Talk with your health care provider about your test results, treatment options,and if necessary, the need for more tests. Follow these instructions at home: Eating and drinking  Eat a healthy diet that includes fresh fruits and vegetables, whole grains, lean protein, and low-fat dairy products. Take vitamin and mineral supplements as recommended by your health care provider. Do not drink alcohol if: Your health care provider tells you not to drink. You are pregnant, may be pregnant, or are planning to become pregnant. If you drink alcohol: Limit how much you have to 0-1 drink a day. Be aware of how much alcohol is in your drink. In the [place] one drink equals one 12 oz bottle of beer (355 mL), one 5 oz glass of wine (148 mL), or one 1 oz glass of hard liquor (44 mL).  Lifestyle Take daily care of your teeth and gums. Brush your teeth every morning and night with fluoride toothpaste. Floss one time each day. Stay active. Exercise for at least 30 minutes 5 or more days each week. Do not use any products that contain nicotine or tobacco, such as cigarettes, e-cigarettes, and chewing tobacco. If you need help quitting, ask your health care provider. Do not use drugs. If you are sexually active, practice safe sex. Use a condom or other form of protection to prevent STIs (sexually transmitted infections). If you do not wish to become pregnant, use a form of birth control. If you plan to become pregnant, see your health care   provider for a prepregnancy visit. Find healthy ways to cope with stress, such as: Meditation, yoga, or listening to music. Journaling. Talking to a trusted person. Spending time with friends and family. Safety Always wear your seat belt while driving or riding in a  vehicle. Do not drive: If you have been drinking alcohol. Do not ride with someone who has been drinking. When you are tired or distracted. While texting. Wear a helmet and other protective equipment during sports activities. If you have firearms in your house, make sure you follow all gun safety procedures. Seek help if you have been physically or sexually abused. What's next? Go to your health care provider once a year for an annual wellness visit. Ask your health care provider how often you should have your eyes and teeth checked. Stay up to date on all vaccines. This information is not intended to replace advice given to you by your health care provider. Make sure you discuss any questions you have with your healthcare provider. Document Revised: 01/17/2020 Document Reviewed: 01/30/2018 Elsevier Patient Education  2022 [employer].

## 2020-12-02 LAB — CYTOLOGY - PAP
Comment: NEGATIVE
Diagnosis: NEGATIVE
High risk HPV: NEGATIVE

## 2021-01-10 ENCOUNTER — Other Ambulatory Visit: Payer: Self-pay

## 2021-01-10 ENCOUNTER — Ambulatory Visit
Admission: RE | Admit: 2021-01-10 | Discharge: 2021-01-10 | Disposition: A | Discharge status: Home / Self Care | Payer: BC Managed Care – PPO | Source: Ambulatory Visit | Attending: Emergency Medicine | Admitting: Emergency Medicine

## 2021-01-10 VITALS — BP 137/81 | HR 84 | Temp 98.8°F | Resp 18

## 2021-01-10 DIAGNOSIS — L03031 Cellulitis of right toe: Secondary | ICD-10-CM

## 2021-01-10 MED ORDER — SULFAMETHOXAZOLE-TRIMETHOPRIM 800-160 MG PO TABS: 1.0000 | ORAL_TABLET | Freq: Two times a day (BID) | ORAL | 0 refills | Status: AC

## 2021-01-10 NOTE — ED Triage Notes (Signed)
Pt here with right great toe redness and drainage x 1 week. Describes a spot above the nail that was red and itchy for a few days then he scratched it and it started to open and drain. Reports it is getting better, but was concerned due to her hx of DM.

## 2021-01-10 NOTE — Discharge Instructions (Addendum)
Take the antibiotic as directed.  Keep your wound clean and dry.  Wash it gently with soap and water twice a day.  Then apply an antibiotic ointment and bandage.    Follow-up with your primary care provider for recheck of the area in 1 week.

## 2021-01-10 NOTE — ED Provider Notes (Signed)
Roderic Palau    CSN: [id number] Arrival date & time: 01/10/21  1443      History   Chief Complaint Chief Complaint  Patient presents with   Wound Check    HPI Caitlin Gomez is a 40 y.o. female.  Patient presents with redness at the base of her right great toe for 1 week.  No known injury.  The area had a small amount of purulent drainage but this resolved.  She states she has been soaking it in Epsom salts and it is getting better but she is concerned because she is diabetic.  She denies fever, chills, numbness, weakness, paresthesias, or other symptoms.  Her medical history includes diabetes and hypertension.  The history is provided by the patient and medical records.   Past Medical History:  Diagnosis Date   Bipolar 1 disorder ([place])    Chicken pox    Depression    Diabetes mellitus without complication ([place])    Diverticulitis    12/2015   Eating disorder    Genital warts    GERD (gastroesophageal reflux disease)    Hypertension    PTSD (post-traumatic stress disorder)     Patient Active Problem List   Diagnosis Date Noted   Irregular menses 46/27/0350   Metabolic syndrome 09/38/1829   Chronic idiopathic urticaria 05/04/2019   Hypercholesterolemia 03/28/2018   Morbid obesity with BMI of 40.0-44.9, adult ([place]) 09/05/2017   History of diverticulitis 09/05/2017   Incidental lung nodule 02/18/2017   Allergic rhinitis 09/05/2016   Fatty liver 06/10/2016   Bipolar disorder ([place]) 03/05/2016   Essential hypertension 03/05/2016   Type 2 diabetes mellitus without complication, without long-term current use of insulin ([place]) 03/05/2016    Past Surgical History:  Procedure Laterality Date   TONSILLECTOMY AND ADENOIDECTOMY      OB History     Gravida  1   Para  1   Term  1   Preterm      AB      Living  1      SAB      IAB      Ectopic      Multiple      Live Births  1            Home Medications    Prior to Admission medications    Medication Sig Start Date End Date Taking? Authorizing Provider  sulfamethoxazole-trimethoprim (BACTRIM DS) 800-160 MG tablet Take 1 tablet by mouth 2 (two) times daily for 7 days. 01/10/21 01/17/21 Yes Sharion Balloon, NP  albuterol (VENTOLIN HFA) 108 (90 Base) MCG/ACT inhaler Inhale 2 puffs into the lungs every 6 (six) hours as needed for wheezing or shortness of breath. 03/12/19   Delsa Grana, PA-C  blood glucose meter kit and supplies KIT Dispense based on patient and insurance preference. Use daily prn for blood sugar monitoring for diabetes ICD-10-E11.65 Patient not taking: Reported on 11/15/2020 04/19/20   Delsa Grana, PA-C  lamoTRIgine (LAMICTAL) 100 MG tablet Take 250 mg by mouth daily.     [provider]  lisinopril (ZESTRIL) 10 MG tablet Take 1 tablet (10 mg total) by mouth daily. 01/18/20   Delsa Grana, PA-C  metFORMIN (GLUCOPHAGE) 1000 MG tablet Take 1 tablet (1,000 mg total) by mouth 2 (two) times daily with a meal. 04/19/20   Delsa Grana, PA-C  ONETOUCH VERIO test strip USE AS DIRECTED Patient not taking: Reported on 11/15/2020 08/26/20   Delsa Grana, PA-C  propranolol (INDERAL)  20 MG tablet Take 20 mg by mouth daily. 07/13/20   [provider]  QUEtiapine (SEROQUEL) 50 MG tablet Take 50 mg by mouth as needed.     [provider]    Family History Family History  Problem Relation Age of Onset   Stroke Paternal Grandfather    Diabetes Paternal Grandfather    Diabetes Paternal Grandmother    Early death Maternal Grandmother    Mental illness Maternal Grandmother    Diabetes Father    Alcohol abuse Mother    Hyperlipidemia Mother    Hypertension Mother    Mental illness Mother    Diabetes Mother    Breast cancer Paternal Aunt    Mental illness Paternal Aunt    Cancer Paternal Aunt        lung   Breast cancer Paternal Aunt 109    Social History Social History   Tobacco Use   Smoking status: Former    Types: Cigarettes   Smokeless tobacco:  Never  Vaping Use   Vaping Use: Never used  Substance Use Topics   Alcohol use: Not Currently   Drug use: No     Allergies   Amoxicillin, Clindamycin, Oysters [shellfish allergy], Ziprasidone hcl, and Amoxicillin-pot clavulanate   Review of Systems Review of Systems  Constitutional:  Negative for chills and fever.  Respiratory:  Negative for cough and shortness of breath.   Cardiovascular:  Negative for chest pain and palpitations.  Musculoskeletal:  Negative for gait problem and joint swelling.  Skin:  Positive for color change and wound.  Neurological:  Negative for weakness and numbness.  All other systems reviewed and are negative.   Physical Exam Triage Vital Signs ED Triage Vitals  Enc Vitals Group     BP 01/10/21 1456 137/81     Pulse Rate 01/10/21 1456 84     Resp 01/10/21 1456 18     Temp 01/10/21 1456 98.8 F (37.1 C)     Temp Source 01/10/21 1456 Oral     SpO2 01/10/21 1456 97 %     Weight --      Height --      Head Circumference --      Peak Flow --      Pain Score 01/10/21 1501 2     Pain Loc --      Pain Edu? --      Excl. in [place]? --    No data found.  Updated Vital Signs BP 137/81 (BP Location: Left Arm)   Pulse 84   Temp 98.8 F (37.1 C) (Oral)   Resp 18   SpO2 97%   Visual Acuity Right Eye Distance:   Left Eye Distance:   Bilateral Distance:    Right Eye Near:   Left Eye Near:    Bilateral Near:     Physical Exam Vitals and nursing note reviewed.  Constitutional:      General: She is not in acute distress.    Appearance: She is well-developed. She is not ill-appearing.  HENT:     Head: Normocephalic and atraumatic.     Mouth/Throat:     Mouth: Mucous membranes are moist.  Eyes:     Conjunctiva/sclera: Conjunctivae normal.  Cardiovascular:     Rate and Rhythm: Normal rate and regular rhythm.     Heart sounds: Normal heart sounds.  Pulmonary:     Effort: Pulmonary effort is normal. No respiratory distress.     Breath  sounds: Normal breath sounds.  Abdominal:     Palpations: Abdomen is soft.     Tenderness: There is no abdominal tenderness.  Musculoskeletal:        General: No swelling or deformity. Normal range of motion.     Cervical back: Neck supple.  Skin:    General: Skin is warm and dry.     Capillary Refill: Capillary refill takes less than 2 seconds.     Findings: Erythema and lesion present.     Comments: Mild healing superficial wound just below right great toenail; no drainage; faint erythema. RLE: 2+ pulses, FROM, sensation intact, strength 5/5.    Neurological:     General: No focal deficit present.     Mental Status: She is alert and oriented to person, place, and time.     Sensory: No sensory deficit.     Motor: No weakness.     Gait: Gait normal.  Psychiatric:        Mood and Affect: Mood normal.        Behavior: Behavior normal.     UC Treatments / Results  Labs (all labs ordered are listed, but only abnormal results are displayed) Labs Reviewed - No data to display  EKG   Radiology No results found.  Procedures Procedures (including critical care time)  Medications Ordered in UC Medications - No data to display  Initial Impression / Assessment and Plan / UC Course  I have reviewed the triage vital signs and the nursing notes.  Pertinent labs & imaging results that were available during my care of the patient were reviewed by me and considered in my medical decision making (see chart for details).  Cellulitis of right great toe.  The wound appears to be healing and only has mild erythema.  Patient is diabetic.  Treating with Bactrim DS.  Wound care instructions discussed.  Also discussed follow-up with her PCP in 1 week for recheck of the area; Sooner if she notes signs of worsening infection.  Patient agrees to plan of care.   Final Clinical Impressions(s) / UC Diagnoses   Final diagnoses:  Cellulitis of great toe, right     Discharge Instructions       Take the antibiotic as directed.  Keep your wound clean and dry.  Wash it gently with soap and water twice a day.  Then apply an antibiotic ointment and bandage.    Follow-up with your primary care provider for recheck of the area in 1 week.         ED Prescriptions     Medication Sig Dispense Auth. Provider   sulfamethoxazole-trimethoprim (BACTRIM DS) 800-160 MG tablet Take 1 tablet by mouth 2 (two) times daily for 7 days. 14 tablet Sharion Balloon, NP      PDMP not reviewed this encounter.   Sharion Balloon, NP 01/10/21 [phone]

## 2021-01-24 ENCOUNTER — Other Ambulatory Visit: Payer: Self-pay | Admitting: Family Medicine

## 2021-01-24 DIAGNOSIS — I10 Essential (primary) hypertension: Secondary | ICD-10-CM

## 2021-02-16 ENCOUNTER — Ambulatory Visit: Payer: BC Managed Care – PPO | Admitting: Unknown Physician Specialty

## 2021-02-16 ENCOUNTER — Ambulatory Visit: Payer: BC Managed Care – PPO | Admitting: Family Medicine

## 2021-02-23 ENCOUNTER — Encounter: Payer: Self-pay | Admitting: Unknown Physician Specialty

## 2021-02-23 ENCOUNTER — Ambulatory Visit: Payer: BC Managed Care – PPO | Admitting: Unknown Physician Specialty

## 2021-02-23 ENCOUNTER — Other Ambulatory Visit: Payer: Self-pay

## 2021-02-23 VITALS — BP 136/82 | HR 78 | Temp 98.1°F | Resp 16 | Ht 66.0 in | Wt 237.0 lb

## 2021-02-23 DIAGNOSIS — I10 Essential (primary) hypertension: Secondary | ICD-10-CM | POA: Diagnosis not present

## 2021-02-23 DIAGNOSIS — E119 Type 2 diabetes mellitus without complications: Secondary | ICD-10-CM

## 2021-02-23 DIAGNOSIS — E7800 Pure hypercholesterolemia, unspecified: Secondary | ICD-10-CM

## 2021-02-23 LAB — COMPLETE METABOLIC PANEL WITH GFR
AG Ratio: 1.5 (calc) (ref 1.0–2.5)
ALT: 13 U/L (ref 6–29)
AST: 14 U/L (ref 10–30)
Albumin: 4.4 g/dL (ref 3.6–5.1)
Alkaline phosphatase (APISO): 42 U/L (ref 31–125)
BUN: 11 mg/dL (ref 7–25)
CO2: 25 mmol/L (ref 20–32)
Calcium: 9.6 mg/dL (ref 8.6–10.2)
Chloride: 106 mmol/L (ref 98–110)
Creat: 0.76 mg/dL (ref 0.50–0.97)
Globulin: 2.9 g/dL (calc) (ref 1.9–3.7)
Glucose, Bld: 130 mg/dL — ABNORMAL HIGH (ref 65–99)
Potassium: 4.8 mmol/L (ref 3.5–5.3)
Sodium: 140 mmol/L (ref 135–146)
Total Bilirubin: 0.4 mg/dL (ref 0.2–1.2)
Total Protein: 7.3 g/dL (ref 6.1–8.1)
eGFR: 103 mL/min/{1.73_m2} (ref >=60)

## 2021-02-23 LAB — LIPID PANEL
Cholesterol: 192 mg/dL (ref <200)
HDL: 33 mg/dL — ABNORMAL LOW (ref >=50)
LDL Cholesterol (Calc): 125 mg/dL (calc) — ABNORMAL HIGH
Non-HDL Cholesterol (Calc): 159 mg/dL (calc) — ABNORMAL HIGH (ref <130)
Total CHOL/HDL Ratio: 5.8 (calc) — ABNORMAL HIGH (ref <5.0)
Triglycerides: 215 mg/dL — ABNORMAL HIGH (ref <150)

## 2021-02-23 LAB — POCT GLYCOSYLATED HEMOGLOBIN (HGB A1C): Hemoglobin A1C: 6.4 % — AB (ref 4.0–5.6)

## 2021-02-23 NOTE — Assessment & Plan Note (Signed)
Hesitant to start statins with declining blood sugar and LDL.  Will continue monitoring

## 2021-02-23 NOTE — Assessment & Plan Note (Signed)
Continues high normal BP.  Continue Lisinopril

## 2021-02-23 NOTE — Progress Notes (Signed)
BP 136/82   Pulse 78   Temp 98.1 F (36.7 C)   Resp 16   Ht 5\' 6"  (1.676 m)   Wt 237 lb (107.5 kg)   SpO2 99%   BMI 38.25 kg/m    Subjective:    Patient ID: Caitlin Gomez, female    DOB: 07/29/1980, 40 y.o.   MRN: [id number]  HPI: Caitlin Gomez is a 40 y.o. female  Chief Complaint  Patient presents with   Check Up   Pt is here for f/u BS and cholesterol control as she is losing weight and making lifestyle changes.  She is taking Metformin and has not been willing to take a statin at this time  Diabetes: Using medications without difficulties No hypoglycemic episodes No hyperglycemic episodes Feet problems: Had cellulitis of her toe.  Went to the ER.  That has resolved Blood Sugars averaging: 130's eye exam within last year Last Hgb A1C: 7.3  Hypertension  Using medications without difficulty Average home BPs Not checking   Using medication without problems or lightheadedness No chest pain with exertion or shortness of breath No Edema  Elevated Cholesterol Using medications without problems No Muscle aches  Diet: Exercise: Working hard on lifestyle changes and losing weight     Relevant past medical, surgical, family and social history reviewed and updated as indicated. Interim medical history since our last visit reviewed. Allergies and medications reviewed and updated.  Per HPI unless specifically indicated above     Objective:    BP 136/82   Pulse 78   Temp 98.1 F (36.7 C)   Resp 16   Ht 5\' 6"  (1.676 m)   Wt 237 lb (107.5 kg)   SpO2 99%   BMI 38.25 kg/m   Wt Readings from Last 3 Encounters:  02/23/21 237 lb (107.5 kg)  12/01/20 244 lb 6.4 oz (110.9 kg)  11/15/20 249 lb 4.8 oz (113.1 kg)    Physical Exam Constitutional:      General: She is not in acute distress.    Appearance: Normal appearance. She is well-developed.  HENT:     Head: Normocephalic and atraumatic.  Eyes:     General: Lids are normal. No scleral icterus.        Right eye: No discharge.        Left eye: No discharge.     Conjunctiva/sclera: Conjunctivae normal.  Cardiovascular:     Rate and Rhythm: Normal rate.  Pulmonary:     Effort: Pulmonary effort is normal.  Abdominal:     Palpations: There is no hepatomegaly or splenomegaly.  Musculoskeletal:        General: Normal range of motion.  Skin:    Coloration: Skin is not pale.     Findings: No rash.  Neurological:     Mental Status: She is alert and oriented to person, place, and time.  Psychiatric:        Behavior: Behavior normal.        Thought Content: Thought content normal.        Judgment: Judgment normal.    Results for orders placed or performed in visit on 02/23/21  POCT HgB A1C  Result Value Ref Range   Hemoglobin A1C 6.4 (A) 4.0 - 5.6 %   HbA1c POC (<> result, manual entry)     HbA1c, POC (prediabetic range)     HbA1c, POC (controlled diabetic range)        Assessment & Plan:   Problem List  Items Addressed This Visit       Unprioritized   Essential hypertension    Continues high normal BP.  Continue Lisinopril      Hypercholesterolemia    Hesitant to start statins with declining blood sugar and LDL.  Will continue monitoring      Type 2 diabetes mellitus without complication, without long-term current use of insulin (HCC) - Primary    Hgb A1C is now 6.4.  Encouraged to continue Metformin      Relevant Orders   COMPLETE METABOLIC PANEL WITH GFR   Lipid panel   POCT HgB A1C (Completed)     Follow up plan: Return if symptoms worsen or fail to improve.

## 2021-02-23 NOTE — Assessment & Plan Note (Signed)
Hgb A1C is now 6.4.  Encouraged to continue Metformin

## 2021-03-22 DIAGNOSIS — S62639A Displaced fracture of distal phalanx of unspecified finger, initial encounter for closed fracture: Secondary | ICD-10-CM | POA: Insufficient documentation

## 2021-04-14 DIAGNOSIS — S62609A Fracture of unspecified phalanx of unspecified finger, initial encounter for closed fracture: Secondary | ICD-10-CM | POA: Insufficient documentation

## 2021-04-14 HISTORY — DX: Fracture of unspecified phalanx of unspecified finger, initial encounter for closed fracture: S62.609A

## 2021-05-09 ENCOUNTER — Other Ambulatory Visit: Payer: Self-pay | Admitting: Family Medicine

## 2021-05-11 ENCOUNTER — Other Ambulatory Visit: Payer: Self-pay | Admitting: Family Medicine

## 2021-06-13 ENCOUNTER — Ambulatory Visit: Payer: BC Managed Care – PPO | Admitting: Unknown Physician Specialty

## 2021-07-11 ENCOUNTER — Ambulatory Visit: Payer: BC Managed Care – PPO | Admitting: Unknown Physician Specialty

## 2021-07-24 ENCOUNTER — Other Ambulatory Visit: Payer: Self-pay | Admitting: Unknown Physician Specialty

## 2021-07-25 NOTE — Telephone Encounter (Signed)
Requested Prescriptions  Pending Prescriptions Disp Refills   metFORMIN (GLUCOPHAGE) 500 MG tablet [Pharmacy Med Name: METFORMIN HCL 500 MG TAB] 360 tablet 1    Sig: TAKE 2 TABLETS BY MOUTH TWICE A DAY [place]     Endocrinology:  Diabetes - Biguanides Failed - 07/24/2021  2:08 PM      Failed - B12 Level in normal range and within 720 days    No results found for: VITAMINB12       Passed - Cr in normal range and within 360 days    Creat  Date Value Ref Range Status  02/23/2021 0.76 0.50 - 0.97 mg/dL Final   Creatinine, Urine  Date Value Ref Range Status  05/04/2009 90.7 mg/dL Final         Passed - HBA1C is between 0 and 7.9 and within 180 days    Hemoglobin A1C  Date Value Ref Range Status  02/23/2021 6.4 (A) 4.0 - 5.6 % Final   Hgb A1c MFr Bld  Date Value Ref Range Status  07/18/2020 7.8 (H) <5.7 % of total Hgb Final    Comment:    For someone without known diabetes, a hemoglobin A1c value of 6.5% or greater indicates that they may have  diabetes and this should be confirmed with a follow-up  test. . For someone with known diabetes, a value <7% indicates  that their diabetes is well controlled and a value  greater than or equal to 7% indicates suboptimal  control. A1c targets should be individualized based on  duration of diabetes, age, comorbid conditions, and  other considerations. . Currently, no consensus exists regarding use of hemoglobin A1c for diagnosis of diabetes for children. .          Passed - eGFR in normal range and within 360 days    GFR, Est African American  Date Value Ref Range Status  07/18/2020 130 > OR = 60 mL/min/1.79m Final   GFR, Est Non African American  Date Value Ref Range Status  07/18/2020 112 > OR = 60 mL/min/1.779mFinal   GFR  Date Value Ref Range Status  12/18/2018 102.95 >60.00 mL/min Final   eGFR  Date Value Ref Range Status  02/23/2021 103 > OR = 60 mL/min/1.7334minal    Comment:    The eGFR is based on the  CKD-EPI 2021 equation. To calculate  the new eGFR from a previous Creatinine or Cystatin C result, go to https://www.kidney.org/professionals/ kdoqi/gfr%5Fcalculator          Passed - Valid encounter within last 6 months    Recent Outpatient Visits          5 months ago Type 2 diabetes mellitus without complication, without long-term current use of insulin [facility]   8 months ago Type 2 diabetes mellitus with hyperglycemia, without long-term current use of insulin [facility]   [facility]   1 year ago Need for hepatitis C screening test   [facility]   1 year ago Abdominal pain, unspecified abdominal location   [facility]             Passed - CBC within normal limits and completed in the last 12 months    WBC  Date Value Ref Range Status  11/15/2020 6.5 3.8 -  10.8 Thousand/uL Final   RBC  Date Value Ref Range Status  11/15/2020 4.45 3.80 - 5.10 Million/uL Final   Hemoglobin  Date Value Ref Range Status  11/15/2020 12.1 11.7 - 15.5 g/dL Final   HGB  Date Value Ref Range Status  11/21/2012 14.5 12.0 - 16.0 g/dL Final   HCT  Date Value Ref Range Status  11/15/2020 37.3 35.0 - 45.0 % Final  11/21/2012 40.9 35.0 - 47.0 % Final   MCHC  Date Value Ref Range Status  11/15/2020 32.4 32.0 - 36.0 g/dL Final   [facility]  Date Value Ref Range Status  11/15/2020 27.2 27.0 - 33.0 pg Final   MCV  Date Value Ref Range Status  11/15/2020 83.8 80.0 - 100.0 fL Final  11/21/2012 84 80 - 100 fL Final   No results found for: PLTCOUNTKUC, LABPLAT, POCPLA RDW  Date Value Ref Range Status  11/15/2020 13.4 11.0 - 15.0 % Final  11/21/2012 12.9 11.5 - 14.5 % Final

## 2021-08-14 LAB — HM DIABETES EYE EXAM

## 2021-09-14 ENCOUNTER — Other Ambulatory Visit: Payer: Self-pay | Admitting: Family Medicine

## 2021-09-14 DIAGNOSIS — E1165 Type 2 diabetes mellitus with hyperglycemia: Secondary | ICD-10-CM

## 2021-09-15 NOTE — Telephone Encounter (Signed)
Requested Prescriptions  ?Pending Prescriptions Disp Refills  ?? ONETOUCH VERIO test strip [Pharmacy Med Name: Roma Schanz STRIP] 50 each 6  ?  Sig: USE AS DIRECTED TO CHECK BLOOD SUGAR ONCE DAILY.  ?  ? Endocrinology: Diabetes - Testing Supplies Passed - 09/14/2021  5:13 PM  ?  ?  Passed - Valid encounter within last 12 months  ?  Recent Outpatient Visits   ?      ? 6 months ago Type 2 diabetes mellitus without complication, without long-term current use of insulin ([place])  ? [facility] Kathrine Haddock, NP  ? 10 months ago Type 2 diabetes mellitus with hyperglycemia, without long-term current use of insulin ([place])  ? [facility] Kathrine Haddock, NP  ? 1 year ago Hives  ? [facility] Delsa Grana, PA-C  ? 1 year ago Need for hepatitis C screening test  ? [facility] Delsa Grana, [place]  ? 1 year ago Abdominal pain, unspecified abdominal location  ? [facility] Delsa Grana, [place]  ?  ?  ?Future Appointments   ?        ? In 2 weeks Delsa Grana, PA-C [facility]  ?  ? ?  ?  ?  ? ? ?

## 2021-10-02 ENCOUNTER — Ambulatory Visit: Payer: BC Managed Care – PPO | Admitting: Family Medicine

## 2021-10-02 ENCOUNTER — Encounter: Payer: Self-pay | Admitting: Family Medicine

## 2021-10-02 VITALS — BP 128/80 | HR 78 | Temp 98.0°F | Resp 18 | Ht 66.0 in | Wt 249.6 lb

## 2021-10-02 DIAGNOSIS — E1165 Type 2 diabetes mellitus with hyperglycemia: Secondary | ICD-10-CM

## 2021-10-02 DIAGNOSIS — E7800 Pure hypercholesterolemia, unspecified: Secondary | ICD-10-CM

## 2021-10-02 DIAGNOSIS — Z0000 Encounter for general adult medical examination without abnormal findings: Secondary | ICD-10-CM | POA: Diagnosis not present

## 2021-10-02 DIAGNOSIS — Z6841 Body Mass Index (BMI) 40.0 and over, adult: Secondary | ICD-10-CM

## 2021-10-02 DIAGNOSIS — E119 Type 2 diabetes mellitus without complications: Secondary | ICD-10-CM | POA: Diagnosis not present

## 2021-10-02 DIAGNOSIS — I10 Essential (primary) hypertension: Secondary | ICD-10-CM

## 2021-10-02 DIAGNOSIS — F319 Bipolar disorder, unspecified: Secondary | ICD-10-CM

## 2021-10-02 NOTE — Progress Notes (Signed)
? ? ?Patient: Caitlin Gomez, Female    DOB: 1981/03/11, 41 y.o.   MRN: [id number] ?Delsa Grana, PA-C ?Visit Date: 10/02/2021 ? ?Today's Provider: Delsa Grana, PA-C  ? ?Chief Complaint  ?Patient presents with  ? Annual Exam  ? ?Subjective:  ? ?Annual physical exam: ? ?Caitlin Gomez is a 41 y.o. female who presents today for complete physical exam: ? ?Exercise/Activity:  walks every day dog, not too strenuous  ?Diet/nutrition:  working with nutritionist and adjusted foods/diet about 2 weeks ago ?Sleep:  poor sleep 4-7 hours  ? ?She has gained weight back with recent depressive episode, sugars were nearly 300 but over the past 2 weeks diet changes has improved fasting CBG to 160's ?On metformin due for DM foot exam ? ?SDOH Screenings  ? ?Alcohol Screen: Low Risk   ? Last Alcohol Screening Score (AUDIT): 0  ?Depression (PHQ2-9): Medium Risk  ? PHQ-2 Score: 13  ?Financial Resource Strain: Low Risk   ? Difficulty of Paying Living Expenses: Not hard at all  ?Food Insecurity: No Food Insecurity  ? Worried About Charity fundraiser in the Last Year: Never true  ? Ran Out of Food in the Last Year: Never true  ?Housing: Low Risk   ? Last Housing Risk Score: 0  ?Physical Activity: Inactive  ? Days of Exercise per Week: 0 days  ? Minutes of Exercise per Session: 0 min  ?Social Connections: Moderately Integrated  ? Frequency of Communication with Friends and Family: More than three times a week  ? Frequency of Social Gatherings with Friends and Family: Once a week  ? Attends Religious Services: Never  ? Active Member of Clubs or Organizations: Yes  ? Attends Archivist Meetings: More than 4 times per year  ? Marital Status: Married  ?Stress: Stress Concern Present  ? Feeling of Stress : Very much  ?Tobacco Use: Medium Risk  ? Smoking Tobacco Use: Former  ? Smokeless Tobacco Use: Never  ? Passive Exposure: Not on file  ?Transportation Needs: No Transportation Needs  ? Lack of Transportation (Medical): No  ? Lack of  Transportation (Non-Medical): No  ? ? ? ? ?USPSTF grade A and B recommendations - reviewed and addressed today ? ?Depression:  ?Phq 9 completed today by patient, was reviewed by me with patient in the room ?PHQ score is positive, pt feels down - per psychiatry and therapist ? ?  10/02/2021  ?  2:13 PM 02/23/2021  ?  8:54 AM 11/15/2020  ?  8:17 AM 07/18/2020  ? 10:31 AM  ?PHQ 2/9 Scores  ?PHQ - 2 Score 5 2 2 4   ?PHQ- 9 Score 13 4 3 9   ? ? ?  10/02/2021  ?  2:13 PM 02/23/2021  ?  8:54 AM 11/15/2020  ?  8:17 AM 07/18/2020  ? 10:31 AM 04/18/2020  ? 10:03 AM  ?Depression screen PHQ 2/9  ?Decreased Interest 2 1 1 2 1   ?Down, Depressed, Hopeless 3 1 1 2 1   ?PHQ - 2 Score 5 2 2 4 2   ?Altered sleeping 2 1 0 0 0  ?Tired, decreased energy 2 1 0 1 1  ?Change in appetite 2 0 0 2 1  ?Feeling bad or failure about yourself  0 0 0 1 0  ?Trouble concentrating 2 0 1 1 0  ?Moving slowly or fidgety/restless 0 0 0 0 0  ?Suicidal thoughts 0 0 0 0 0  ?PHQ-9 Score 13 4 3 9 4   ?Difficult  doing work/chores Somewhat difficult  Not difficult at all Somewhat difficult Not difficult at all  ? ? ? ?Alcohol screening: ?[place] Office Visit from 11/15/2020 [facility]  ?AUDIT-C Score 0  ? ?  ? ? ?Immunizations and Health Maintenance: ?Health Maintenance  ?Topic Date Due  ? FOOT EXAM  03/29/2019  ? HEMOGLOBIN A1C  08/23/2021  ? COVID-19 Vaccine (5 - Booster for [place] series) 10/18/2021 (Originally 10/20/2020)  ? OPHTHALMOLOGY EXAM  11/03/2021  ? INFLUENZA VACCINE  01/02/2022  ? TETANUS/TDAP  03/11/2024  ? PAP SMEAR-Modifier  12/01/2025  ? Hepatitis C Screening  Completed  ? HIV Screening  Completed  ? HPV VACCINES  Aged Out  ?  ? ?Hep C Screening: done previously ? ?STD testing and prevention (HIV/chl/gon/syphilis):  see above, no additional testing desired by pt today ? ?Intimate partner violence:  safe denies abuse  ? ?Sexual History/Pain during Intercourse: Married ? ?Menstrual History/LMP/Abnormal Bleeding: normal no  concerns ?No LMP recorded. ? ?Incontinence Symptoms: none ? ?Breast cancer: not due per age and family hx ?Last Mammogram: *see HM list above ?BRCA gene screening: none known in fam ?Paternal aunts x 2 with BCA - no known genetic testing ? ?Cervical cancer screening: UTD per GYN ?Pt denies family hx of cancers -, ovarian, uterine, colon:    ? ?Osteoporosis:   ?Discussion on osteoporosis per age, including high calcium and vitamin D supplementation, weight bearing exercises ?Pt is not supplementing with daily calcium/Vit D. ?N/a Bone scan/dexa ? ? ?Skin cancer:  Hx of skin CA -  NO ?Discussed atypical lesions  ? ?Colorectal cancer:   ?Colonoscopy is not due per age   ?Discussed concerning signs and sx of CRC, pt denies blood in stool, change in bowels ? ?Lung cancer:   ?Low Dose CT Chest recommended if Age 45-80 years, 20 pack-year currently smoking OR have quit w/in 15years. Patient does not qualify.   ? ?Social History  ? ?Tobacco Use  ? Smoking status: Former  ?  Types: Cigarettes  ? Smokeless tobacco: Never  ?Vaping Use  ? Vaping Use: Never used  ?Substance Use Topics  ? Alcohol use: Not Currently  ? Drug use: No  ?  ? ?[place] Office Visit from [facility]15/2020 [facility]  ?AUDIT-C Score 0  ? ?  ? ? ?Family History  ?Problem Relation Age of Onset  ? Stroke Paternal Grandfather   ? Diabetes Paternal Grandfather   ? Diabetes Paternal Grandmother   ? Early death Maternal Grandmother   ? Mental illness Maternal Grandmother   ? Diabetes Father   ? Alcohol abuse Mother   ? Hyperlipidemia Mother   ? Hypertension Mother   ? Mental illness Mother   ? Diabetes Mother   ? Breast cancer Paternal Aunt   ? Mental illness Paternal Aunt   ? Cancer Paternal Aunt   ?     lung  ? Breast cancer Paternal Aunt 40  ?  ? ?Blood pressure/Hypertension: ?BP Readings from Last 3 Encounters:  ?10/02/21 128/80  ?02/23/21 136/82  ?01/10/21 137/81  ? ? ?Weight/Obesity: ?Wt Readings from Last 3 Encounters:  ?10/02/21 249  lb 9.6 oz (113.2 kg)  ?02/23/21 237 lb (107.5 kg)  ?12/01/20 244 lb 6.4 oz (110.9 kg)  ? ?BMI Readings from Last 3 Encounters:  ?10/02/21 40.29 kg/m?  ?02/23/21 38.25 kg/m?  ?12/01/20 39.45 kg/m?  ?  ? ?Lipids:  ?Lab Results  ?Component Value Date  ? CHOL 192 02/23/2021  ?  CHOL 179 11/15/2020  ? CHOL 209 (H) 07/18/2020  ? ?Lab Results  ?Component Value Date  ? HDL 33 (L) 02/23/2021  ? HDL 39 (L) 11/15/2020  ? HDL 44 (L) 07/18/2020  ? ?Lab Results  ?Component Value Date  ? LDLCALC 125 (H) 02/23/2021  ? LDLCALC 111 (H) 11/15/2020  ? LDLCALC 133 (H) 07/18/2020  ? ?Lab Results  ?Component Value Date  ? TRIG 215 (H) 02/23/2021  ? TRIG 174 (H) 11/15/2020  ? TRIG 185 (H) 07/18/2020  ? ?Lab Results  ?Component Value Date  ? CHOLHDL 5.8 (H) 02/23/2021  ? CHOLHDL 4.6 11/15/2020  ? CHOLHDL 4.8 07/18/2020  ? ?Lab Results  ?Component Value Date  ? LDLDIRECT 129.0 12/09/2018  ? LDLDIRECT 140.0 03/21/2018  ? LDLDIRECT 118.0 12/20/2017  ? ?Based on the results of lipid panel his/her cardiovascular risk factor ( using [facility] )  in the next 10 years is: ?The ASCVD Risk score (Arnett DK, et al., 2019) failed to calculate for the following reasons: ?  The 2019 ASCVD risk score is only valid for ages 74 to 56 ? ?Glucose:  ?Glucose  ?Date Value Ref Range Status  ?11/21/2012 188 (H) 65 - 99 mg/dL Final  ? ?Glucose, Bld  ?Date Value Ref Range Status  ?02/23/2021 130 (H) 65 - 99 mg/dL Final  ?  Comment:  ?  . ?           Fasting reference interval ?. ?For someone without known diabetes, a glucose ?value >125 mg/dL indicates that they may have ?diabetes and this should be confirmed with a ?follow-up test. ?. ?  ?11/15/2020 156 (H) 65 - 99 mg/dL Final  ?  Comment:  ?  . ?           Fasting reference interval ?. ?For someone without known diabetes, a glucose ?value >125 mg/dL indicates that they may have ?diabetes and this should be confirmed with a ?follow-up test. ?. ?  ?07/18/2020 148 (H) 65 - 99 mg/dL Final  ?  Comment:  ?  . ?            Fasting reference interval ?. ?For someone without known diabetes, a glucose ?value >125 mg/dL indicates that they may have ?diabetes and this should be confirmed with a ?follow-up test. ?. ?  ? ?Glucose-[place]

## 2021-10-03 LAB — CBC WITH DIFFERENTIAL/PLATELET
Absolute Monocytes: 571 cells/uL (ref 200–950)
Basophils Absolute: 101 cells/uL (ref 0–200)
Basophils Relative: 1.2 %
Eosinophils Absolute: 311 cells/uL (ref 15–500)
Eosinophils Relative: 3.7 %
HCT: 40.6 % (ref 35.0–45.0)
Hemoglobin: 13.6 g/dL (ref 11.7–15.5)
Lymphs Abs: 2990 cells/uL (ref 850–3900)
MCH: 28.8 pg (ref 27.0–33.0)
MCHC: 33.5 g/dL (ref 32.0–36.0)
MCV: 85.8 fL (ref 80.0–100.0)
MPV: 10.1 fL (ref 7.5–12.5)
Monocytes Relative: 6.8 %
Neutro Abs: 4427 cells/uL (ref 1500–7800)
Neutrophils Relative %: 52.7 %
Platelets: 353 10*3/uL (ref 140–400)
RBC: 4.73 10*6/uL (ref 3.80–5.10)
RDW: 13.2 % (ref 11.0–15.0)
Total Lymphocyte: 35.6 %
WBC: 8.4 10*3/uL (ref 3.8–10.8)

## 2021-10-03 LAB — COMPLETE METABOLIC PANEL WITH GFR
AG Ratio: 1.5 (calc) (ref 1.0–2.5)
ALT: 16 U/L (ref 6–29)
AST: 12 U/L (ref 10–30)
Albumin: 4.4 g/dL (ref 3.6–5.1)
Alkaline phosphatase (APISO): 45 U/L (ref 31–125)
BUN: 11 mg/dL (ref 7–25)
CO2: 27 mmol/L (ref 20–32)
Calcium: 9.7 mg/dL (ref 8.6–10.2)
Chloride: 102 mmol/L (ref 98–110)
Creat: 0.79 mg/dL (ref 0.50–0.97)
Globulin: 2.9 g/dL (calc) (ref 1.9–3.7)
Glucose, Bld: 145 mg/dL — ABNORMAL HIGH (ref 65–99)
Potassium: 5 mmol/L (ref 3.5–5.3)
Sodium: 139 mmol/L (ref 135–146)
Total Bilirubin: 0.4 mg/dL (ref 0.2–1.2)
Total Protein: 7.3 g/dL (ref 6.1–8.1)
eGFR: 98 mL/min/{1.73_m2} (ref >=60)

## 2021-10-03 LAB — LIPID PANEL
Cholesterol: 206 mg/dL — ABNORMAL HIGH (ref <200)
HDL: 37 mg/dL — ABNORMAL LOW (ref >=50)
LDL Cholesterol (Calc): 121 mg/dL (calc) — ABNORMAL HIGH
Non-HDL Cholesterol (Calc): 169 mg/dL (calc) — ABNORMAL HIGH (ref <130)
Total CHOL/HDL Ratio: 5.6 (calc) — ABNORMAL HIGH (ref <5.0)
Triglycerides: 334 mg/dL — ABNORMAL HIGH (ref <150)

## 2021-10-03 LAB — HEMOGLOBIN A1C
Hgb A1c MFr Bld: 8.7 % of total Hgb — ABNORMAL HIGH (ref <5.7)
Mean Plasma Glucose: 203 mg/dL
eAG (mmol/L): 11.2 mmol/L

## 2021-10-04 ENCOUNTER — Other Ambulatory Visit: Payer: Self-pay | Admitting: Family Medicine

## 2021-10-04 DIAGNOSIS — E7800 Pure hypercholesterolemia, unspecified: Secondary | ICD-10-CM

## 2021-10-04 DIAGNOSIS — E119 Type 2 diabetes mellitus without complications: Secondary | ICD-10-CM

## 2021-10-04 MED ORDER — ROSUVASTATIN CALCIUM 5 MG PO TABS
5.0000 mg | ORAL_TABLET | Freq: Every day | ORAL | 3 refills | Status: DC
Start: 2021-10-04 — End: 2022-04-10

## 2021-10-20 ENCOUNTER — Other Ambulatory Visit: Payer: Self-pay | Admitting: Family Medicine

## 2021-10-20 IMAGING — CT CT ABD-PELV W/ CM
2 of 4 series · 16 of 46 positions shown, 18 images · IV contrast (APPLIED)
Comparison: None.

CLINICAL DATA: Abdominal pain.  Nausea, vomiting and diarrhea.

EXAM:
CT ABDOMEN AND PELVIS WITH CONTRAST
TECHNIQUE: Multidetector CT imaging of the abdomen and pelvis was performed
using the standard protocol following bolus administration of
intravenous contrast.
CONTRAST:  125mL OMNIPAQUE IOHEXOL 300 MG/ML  SOLN

[Series 2: routine abd/pel with · axial · 0.88mm/px · z∈[-1008,-493]mm · 13 of 115 slices shown, 15 images]
[im 6/115  soft-tissue]
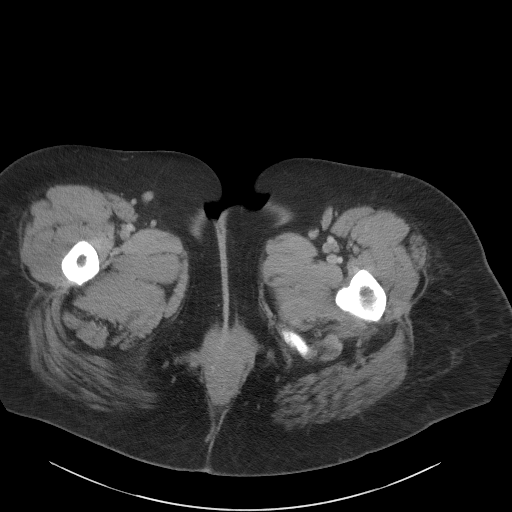
[im 6/115  bone]
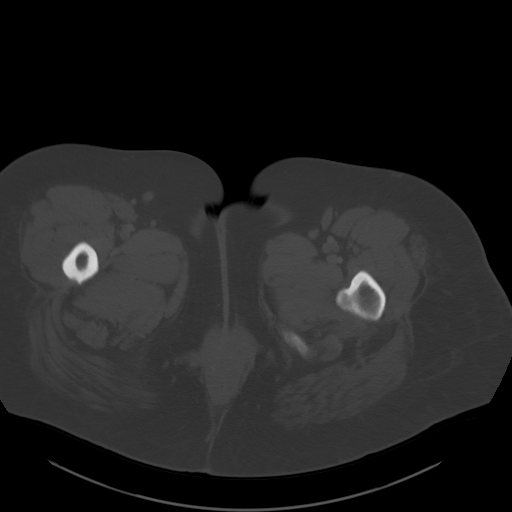
[im 16/115  soft-tissue]
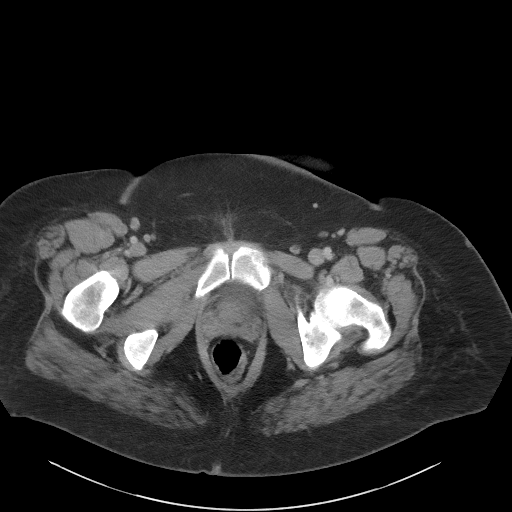
[im 26/115  soft-tissue]
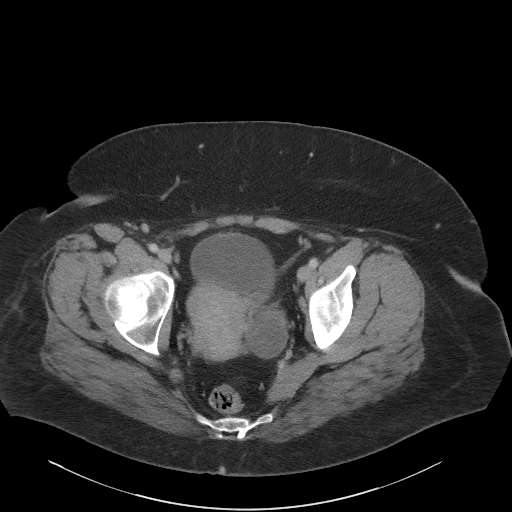
[im 32/115  soft-tissue]
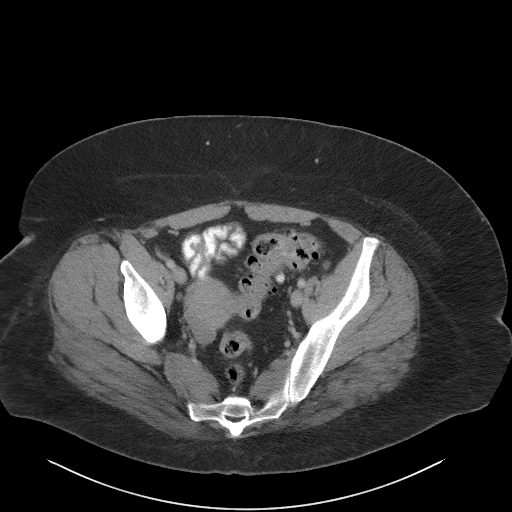
[im 42/115  soft-tissue]
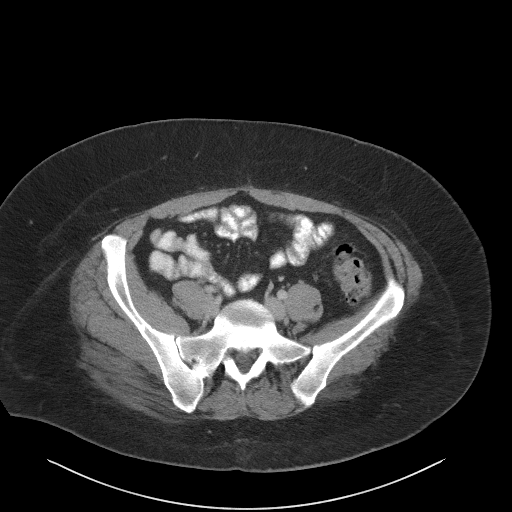
[im 47/115  soft-tissue]
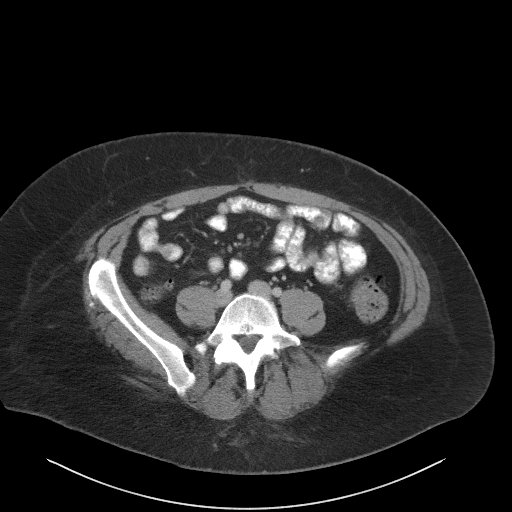
[im 58/115  soft-tissue]
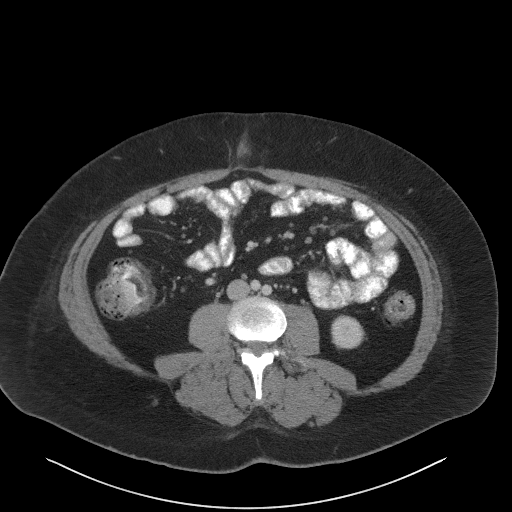
[im 68/115  soft-tissue]
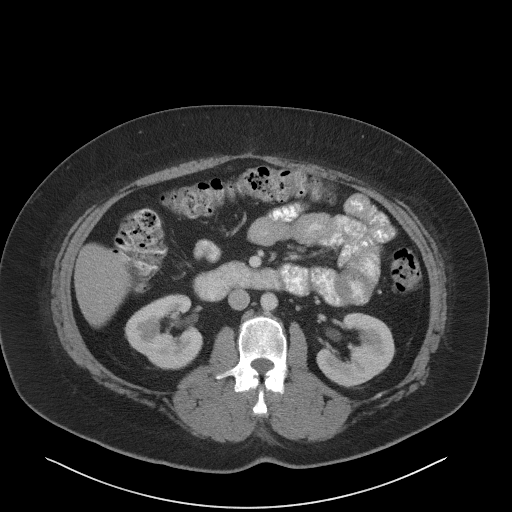
[im 73/115  soft-tissue]
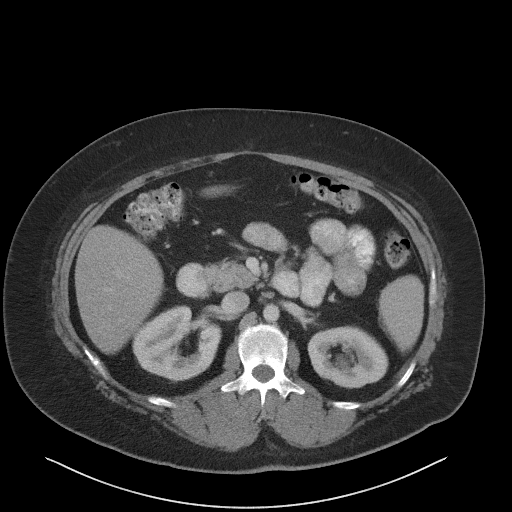
[im 73/115  bone]
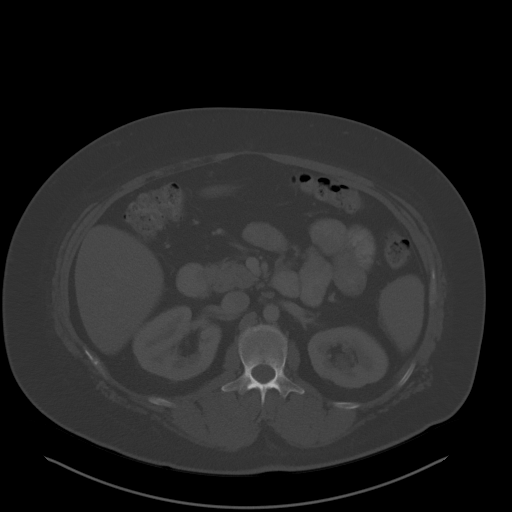
[im 83/115  soft-tissue]
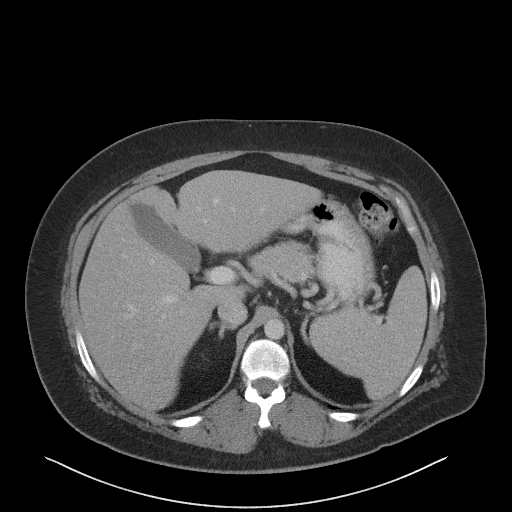
[im 89/115  soft-tissue]
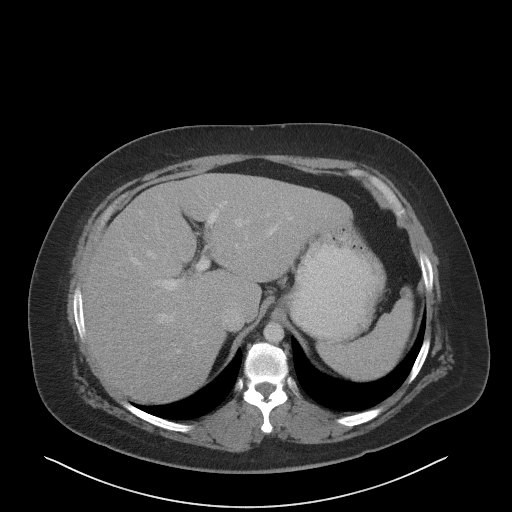
[im 99/115  soft-tissue]
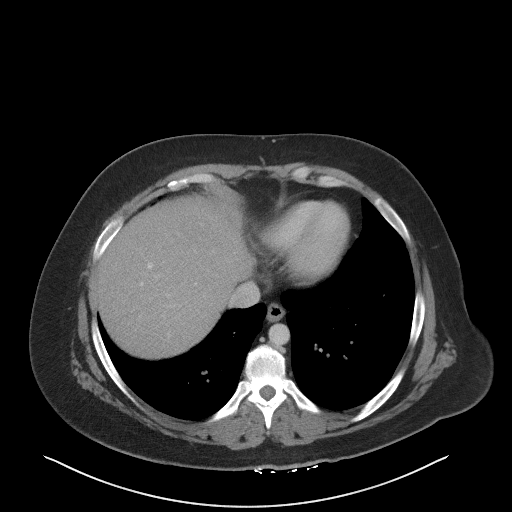
[im 109/115  soft-tissue]
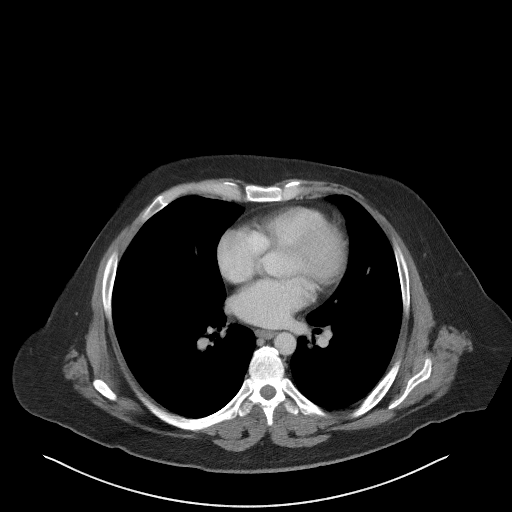

[Series 5: coronal st · coronal · 0.85mm/px · 3 of 105 slices shown]
[im 35/105  soft-tissue]
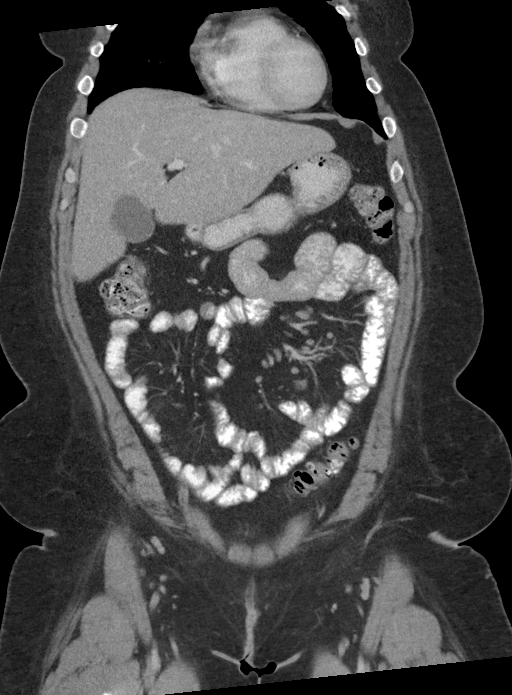
[im 47/105  soft-tissue]
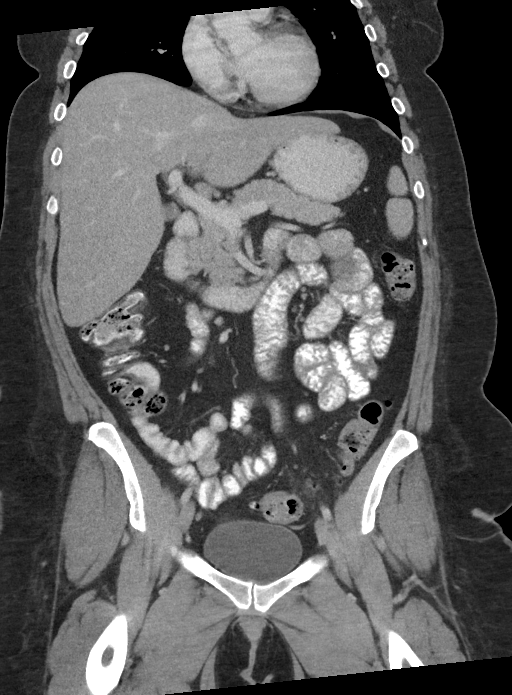
[im 58/105  soft-tissue]
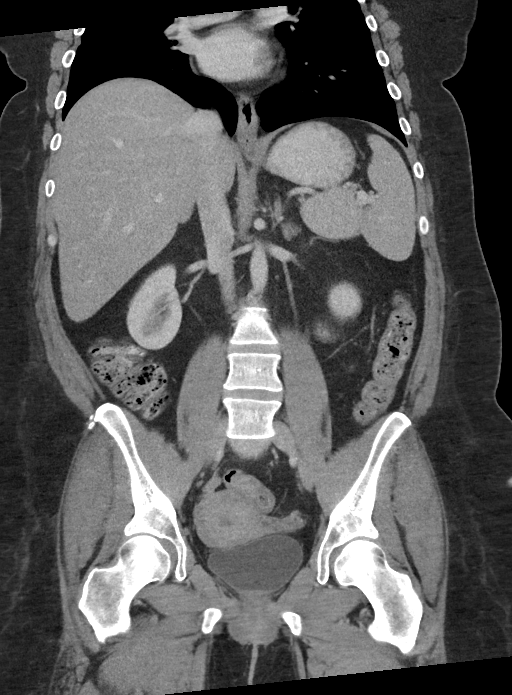

[16 of 46 positions shown; findings below may reference images not displayed]

FINDINGS: LOWER CHEST: Normal.

HEPATOBILIARY: Normal hepatic contours. No intra- or extrahepatic
biliary dilatation. Normal gallbladder.

PANCREAS: Normal pancreas. No ductal dilatation or peripancreatic
fluid collection.

SPLEEN: Normal.

ADRENALS/URINARY TRACT: The adrenal glands are normal. No
hydronephrosis, nephroureterolithiasis or solid renal mass. The
urinary bladder is normal for degree of distention

STOMACH/BOWEL: There is no hiatal hernia. Normal duodenal course and
caliber. No small bowel dilatation or inflammation. No focal colonic
abnormality. Normal appendix.

VASCULAR/LYMPHATIC: Normal course and caliber of the major abdominal
vessels. No abdominal or pelvic lymphadenopathy.

REPRODUCTIVE: Normal uterus. No adnexal mass.

MUSCULOSKELETAL. No bony spinal canal stenosis or focal osseous
abnormality.

OTHER: None.
IMPRESSION: No acute abnormality of the abdomen or pelvis.

## 2021-11-10 ENCOUNTER — Ambulatory Visit: Payer: BC Managed Care – PPO | Admitting: Family Medicine

## 2021-11-10 ENCOUNTER — Encounter: Payer: Self-pay | Admitting: Family Medicine

## 2021-11-10 VITALS — BP 138/84 | HR 99 | Temp 98.3°F | Resp 16 | Ht 66.5 in | Wt 246.1 lb

## 2021-11-10 DIAGNOSIS — R1032 Left lower quadrant pain: Secondary | ICD-10-CM | POA: Diagnosis not present

## 2021-11-10 DIAGNOSIS — Z8719 Personal history of other diseases of the digestive system: Secondary | ICD-10-CM | POA: Diagnosis not present

## 2021-11-10 MED ORDER — CIPROFLOXACIN HCL 500 MG PO TABS
500.0000 mg | ORAL_TABLET | Freq: Two times a day (BID) | ORAL | 0 refills | Status: AC
Start: 2021-11-10 — End: 2021-11-20

## 2021-11-10 MED ORDER — METRONIDAZOLE 500 MG PO TABS: 500.0000 mg | ORAL_TABLET | Freq: Three times a day (TID) | ORAL | 0 refills | Status: AC

## 2021-11-10 NOTE — Progress Notes (Signed)
Patient ID: Caitlin Gomez, female    DOB: 07-23-1980, 41 y.o.   MRN: [id number]  PCP: Delsa Grana, PA-C  Chief Complaint  Patient presents with   Abdominal Pain    Pt believes is exeriencing diverticulitis, pain is about 2. Denies nausea/fever    Subjective:   Caitlin Gomez is a 41 y.o. female, presents to clinic with CC of the following:  HPI  Left lower abd pain and pelvic pain and change in bowels with change calibuer of stool - thin BM consistent with her diverticulitis  Usually abx help She ate strawberries yesterday which has triggered it in the past No fever, urinary sx, vaginal sx, sweats, chills, vomiting    Patient Active Problem List   Diagnosis Date Noted   Type 2 diabetes mellitus with hyperglycemia, without long-term current use of insulin ([place]) 10/02/2021   Fracture of phalanx of finger 04/14/2021   Closed fracture of distal phalanx of finger 03/22/2021   Irregular menses 40/98/1191   Metabolic syndrome 47/82/9562   Chronic idiopathic urticaria 05/04/2019   Hypercholesterolemia 03/28/2018   Morbid obesity with BMI of 40.0-44.9, adult ([place]) 09/05/2017   History of diverticulitis 09/05/2017   Incidental lung nodule 02/18/2017   Allergic rhinitis 09/05/2016   Fatty liver 06/10/2016   Bipolar disorder ([place]) 03/05/2016   Essential hypertension 03/05/2016   Type 2 diabetes mellitus without complication, without long-term current use of insulin ([place]) 03/05/2016      Current Outpatient Medications:    albuterol (VENTOLIN HFA) 108 (90 Base) MCG/ACT inhaler, Inhale 2 puffs into the lungs every 6 (six) hours as needed for wheezing or shortness of breath., Disp: 18 g, Rfl: 1   blood glucose meter kit and supplies KIT, Dispense based on patient and insurance preference. Use daily prn for blood sugar monitoring for diabetes ICD-10-E11.65, Disp: 1 each, Rfl: 0   famotidine (PEPCID) 40 MG tablet, Pepcid, Disp: , Rfl:    glucose blood test strip, OneTouch Verio  test strips, Disp: , Rfl:    lamoTRIgine (LAMICTAL) 100 MG tablet, Take 250 mg by mouth daily. , Disp: , Rfl:    lisinopril (ZESTRIL) 10 MG tablet, TAKE 1 TABLET BY MOUTH ONCE DAILY, Disp: 90 tablet, Rfl: 3   metFORMIN (GLUCOPHAGE) 500 MG tablet, Take 2 tablets (1,000 mg total) by mouth 2 (two) times daily with a meal., Disp: 360 tablet, Rfl: 1   ONETOUCH VERIO test strip, USE AS DIRECTED TO CHECK BLOOD SUGAR ONCE DAILY., Disp: 50 each, Rfl: 6   propranolol (INDERAL) 20 MG tablet, Take 20 mg by mouth daily., Disp: , Rfl:    QUEtiapine (SEROQUEL) 50 MG tablet, Take 50 mg by mouth as needed. , Disp: , Rfl:    rosuvastatin (CRESTOR) 5 MG tablet, Take 1 tablet (5 mg total) by mouth at bedtime., Disp: 90 tablet, Rfl: 3   Allergies  Allergen Reactions   Amoxicillin Other (See Comments)    Chest pain    Clindamycin Swelling   Oysters [Shellfish Allergy]     Dizzy, nauseated   Ziprasidone Hcl Swelling   Amoxicillin-Pot Clavulanate Rash     Social History   Tobacco Use   Smoking status: Former    Types: Cigarettes   Smokeless tobacco: Never  Vaping Use   Vaping Use: Never used  Substance Use Topics   Alcohol use: Not Currently   Drug use: No      Chart Review Today: I personally reviewed active problem list, medication list, allergies, family history,  social history, health maintenance, notes from last encounter, lab results, imaging with the patient/caregiver today.   Review of Systems  Constitutional: Negative.   HENT: Negative.    Eyes: Negative.   Respiratory: Negative.    Cardiovascular: Negative.   Gastrointestinal: Negative.   Endocrine: Negative.   Genitourinary: Negative.   Musculoskeletal: Negative.   Skin: Negative.   Allergic/Immunologic: Negative.   Neurological: Negative.   Hematological: Negative.   Psychiatric/Behavioral: Negative.    All other systems reviewed and are negative.      Objective:   Vitals:   11/10/21 1118  BP: 138/84  Pulse: 99   Resp: 16  Temp: 98.3 F (36.8 C)  TempSrc: Oral  SpO2: 99%  Weight: 246 lb 1.6 oz (111.6 kg)  Height: 5' 6.5" (1.689 m)    Body mass index is 39.13 kg/m.  Physical Exam Vitals and nursing note reviewed.  Constitutional:      General: She is not in acute distress.    Appearance: Normal appearance. She is well-developed. She is obese. She is not ill-appearing, toxic-appearing or diaphoretic.     Interventions: Face mask in place.  HENT:     Head: Normocephalic and atraumatic.     Right Ear: External ear normal.     Left Ear: External ear normal.  Eyes:     General: Lids are normal. No scleral icterus.       Right eye: No discharge.        Left eye: No discharge.     Conjunctiva/sclera: Conjunctivae normal.  Neck:     Trachea: Phonation normal. No tracheal deviation.  Cardiovascular:     Rate and Rhythm: Normal rate and regular rhythm.     Pulses: Normal pulses.          Radial pulses are 2+ on the right side and 2+ on the left side.       Posterior tibial pulses are 2+ on the right side and 2+ on the left side.     Heart sounds: Normal heart sounds. No murmur heard.    No friction rub. No gallop.  Pulmonary:     Effort: Pulmonary effort is normal. No respiratory distress.     Breath sounds: Normal breath sounds. No stridor. No wheezing, rhonchi or rales.  Chest:     Chest wall: No tenderness.  Abdominal:     General: Bowel sounds are normal. There is no distension.     Palpations: Abdomen is soft.     Tenderness: There is abdominal tenderness (llq). There is no guarding or rebound.  Musculoskeletal:     Right lower leg: No edema.     Left lower leg: No edema.  Skin:    General: Skin is warm and dry.     Coloration: Skin is not jaundiced or pale.     Findings: No rash.  Neurological:     Mental Status: She is alert.     Motor: No abnormal muscle tone.     Gait: Gait normal.  Psychiatric:        Mood and Affect: Mood normal.        Speech: Speech normal.         Behavior: Behavior normal.      Results for orders placed or performed in visit on 10/02/21  COMPLETE METABOLIC PANEL WITH GFR  Result Value Ref Range   Glucose, Bld 145 (H) 65 - 99 mg/dL   BUN 11 7 - 25 mg/dL   Creat 0.79 0.50 -  0.97 mg/dL   eGFR 98 > OR = 60 mL/min/1.17m   BUN/Creatinine Ratio NOT APPLICABLE 6 - 22 (calc)   Sodium 139 135 - 146 mmol/L   Potassium 5.0 3.5 - 5.3 mmol/L   Chloride 102 98 - 110 mmol/L   CO2 27 20 - 32 mmol/L   Calcium 9.7 8.6 - 10.2 mg/dL   Total Protein 7.3 6.1 - 8.1 g/dL   Albumin 4.4 3.6 - 5.1 g/dL   Globulin 2.9 1.9 - 3.7 g/dL (calc)   AG Ratio 1.5 1.0 - 2.5 (calc)   Total Bilirubin 0.4 0.2 - 1.2 mg/dL   Alkaline phosphatase (APISO) 45 31 - 125 U/L   AST 12 10 - 30 U/L   ALT 16 6 - 29 U/L  CBC w/Diff/Platelet  Result Value Ref Range   WBC 8.4 3.8 - 10.8 Thousand/uL   RBC 4.73 3.80 - 5.10 Million/uL   Hemoglobin 13.6 11.7 - 15.5 g/dL   HCT 40.6 35.0 - 45.0 %   MCV 85.8 80.0 - 100.0 fL   MCH 28.8 27.0 - 33.0 pg   MCHC 33.5 32.0 - 36.0 g/dL   RDW 13.2 11.0 - 15.0 %   Platelets 353 140 - 400 Thousand/uL   MPV 10.1 7.5 - 12.5 fL   Neutro Abs 4,427 1,500 - 7,800 cells/uL   Lymphs Abs 2,990 850 - 3,900 cells/uL   Absolute Monocytes 571 200 - 950 cells/uL   Eosinophils Absolute 311 15 - 500 cells/uL   Basophils Absolute 101 0 - 200 cells/uL   Neutrophils Relative % 52.7 %   Total Lymphocyte 35.6 %   Monocytes Relative 6.8 %   Eosinophils Relative 3.7 %   Basophils Relative 1.2 %  Lipid panel  Result Value Ref Range   Cholesterol 206 (H) <200 mg/dL   HDL 37 (L) > OR = 50 mg/dL   Triglycerides 334 (H) <150 mg/dL   LDL Cholesterol (Calc) 121 (H) mg/dL (calc)   Total CHOL/HDL Ratio 5.6 (H) <5.0 (calc)   Non-HDL Cholesterol (Calc) 169 (H) <130 mg/dL (calc)  Hemoglobin A1C  Result Value Ref Range   Hgb A1c MFr Bld 8.7 (H) <5.7 % of total Hgb   Mean Plasma Glucose 203 mg/dL   eAG (mmol/L) 11.2 mmol/L       Assessment & Plan:      ICD-10-CM   1. Left lower quadrant abdominal pain  R10.32 ciprofloxacin (CIPRO) 500 MG tablet    metroNIDAZOLE (FLAGYL) 500 MG tablet    Ambulatory referral to Gastroenterology    2. History of diverticulitis  Z87.19 ciprofloxacin (CIPRO) 500 MG tablet    metroNIDAZOLE (FLAGYL) 500 MG tablet    Ambulatory referral to Gastroenterology     Sx started in the past day, consistent with past diverticulitis - LLQ pain and bowel changes proceeded by a food trigger for her - strawberries Mild ttp on exam, pt well appearing, doubt perforation or spreading infection, VSS Tx with cipro and flagyl Est with GI  - has never had a colonoscopy or flex sig     LDelsa Grana PA-C 11/10/21 11:40 AM

## 2021-11-12 ENCOUNTER — Encounter: Payer: Self-pay | Admitting: Family Medicine

## 2021-11-12 DIAGNOSIS — B379 Candidiasis, unspecified: Secondary | ICD-10-CM

## 2021-11-13 MED ORDER — ONDANSETRON HCL 4 MG PO TABS: 4.0000 mg | ORAL_TABLET | Freq: Three times a day (TID) | ORAL | 0 refills | Status: DC | PRN

## 2021-11-16 MED ORDER — FLUCONAZOLE 150 MG PO TABS: 150.0000 mg | ORAL_TABLET | ORAL | 0 refills | Status: DC | PRN

## 2021-11-16 NOTE — Addendum Note (Signed)
Addended by: Delsa Grana on: 11/16/2021 04:40 PM   Modules accepted: Orders

## 2021-12-04 ENCOUNTER — Telehealth: Payer: BC Managed Care – PPO | Admitting: Family Medicine

## 2021-12-25 ENCOUNTER — Ambulatory Visit: Payer: BC Managed Care – PPO | Admitting: Family Medicine

## 2021-12-29 ENCOUNTER — Telehealth: Payer: BC Managed Care – PPO | Admitting: Nurse Practitioner

## 2022-01-01 ENCOUNTER — Other Ambulatory Visit: Payer: Self-pay

## 2022-01-01 ENCOUNTER — Telehealth: Payer: Self-pay | Admitting: Family Medicine

## 2022-01-01 ENCOUNTER — Telehealth (INDEPENDENT_AMBULATORY_CARE_PROVIDER_SITE_OTHER): Payer: BC Managed Care – PPO | Admitting: Nurse Practitioner

## 2022-01-01 ENCOUNTER — Ambulatory Visit: Payer: BC Managed Care – PPO | Admitting: Family Medicine

## 2022-01-01 ENCOUNTER — Encounter: Payer: Self-pay | Admitting: Nurse Practitioner

## 2022-01-01 DIAGNOSIS — H1033 Unspecified acute conjunctivitis, bilateral: Secondary | ICD-10-CM | POA: Diagnosis not present

## 2022-01-01 DIAGNOSIS — J302 Other seasonal allergic rhinitis: Secondary | ICD-10-CM

## 2022-01-01 MED ORDER — CIPROFLOXACIN HCL 0.3 % OP SOLN: OPHTHALMIC | 1 refills | Status: DC

## 2022-01-01 NOTE — Progress Notes (Signed)
Name: Caitlin Gomez   MRN: [id number]    DOB: 09/24/1980   Date:01/01/2022       Progress Note  Subjective  Chief Complaint  Chief Complaint  Patient presents with   Eye Problem   Allergic Rhinitis     I connected with  [name]  on 01/01/22 at  1:00 PM EDT by a video enabled telemedicine application and verified that I am speaking with the correct person using two identifiers.  I discussed the limitations of evaluation and management by telemedicine and the availability of in person appointments. The patient expressed understanding and agreed to proceed with a virtual visit  Staff also discussed with the patient that there may be a patient responsible charge related to this service. Patient Location: home Provider Location: [facility] Additional Individuals present: alone  HPI  Conjunctivitis/allergies: Patient reports she has had trouble with allergies.  Patient states she does take Allegra and Flonase daily.  Patient states this normally keeps it under control.  Patient states for the last few weeks she has had drainage, itchy and crusting over her eyelids.  Patient states that has increasingly gotten worse.  This was likely due to allergies however may have increased into a bacterial infection.  We will send in antibiotics.  Patient Active Problem List   Diagnosis Date Noted   Type 2 diabetes mellitus with hyperglycemia, without long-term current use of insulin ([facility]) 10/02/2021   Fracture of phalanx of finger 04/14/2021   Closed fracture of distal phalanx of finger 03/22/2021   Irregular menses 05/04/2019   Metabolic syndrome 05/04/2019   Chronic idiopathic urticaria 05/04/2019   Hypercholesterolemia 03/28/2018   Morbid obesity with BMI of 40.0-44.9, adult ([facility]) 09/05/2017   History of diverticulitis 09/05/2017   Incidental lung nodule 02/18/2017   Allergic rhinitis 09/05/2016   Fatty liver 06/10/2016   Bipolar disorder ([facility]) 03/05/2016   Essential hypertension 03/05/2016    Type 2 diabetes mellitus without complication, without long-term current use of insulin ([facility]) 03/05/2016    Social History   Tobacco Use   Smoking status: Former    Types: Cigarettes   Smokeless tobacco: Never  Substance Use Topics   Alcohol use: Not Currently     Current Outpatient Medications:    albuterol (VENTOLIN HFA) 108 (90 Base) MCG/ACT inhaler, Inhale 2 puffs into the lungs every 6 (six) hours as needed for wheezing or shortness of breath., Disp: 18 g, Rfl: 1   famotidine (PEPCID) 40 MG tablet, Pepcid, Disp: , Rfl:    lamoTRIgine (LAMICTAL) 100 MG tablet, Take 250 mg by mouth daily. , Disp: , Rfl:    lisinopril (ZESTRIL) 10 MG tablet, TAKE 1 TABLET BY MOUTH ONCE DAILY, Disp: 90 tablet, Rfl: 3   metFORMIN (GLUCOPHAGE) 500 MG tablet, Take 2 tablets (1,000 mg total) by mouth 2 (two) times daily with a meal., Disp: 360 tablet, Rfl: 1   ondansetron (ZOFRAN) 4 MG tablet, Take 1 tablet (4 mg total) by mouth every 8 (eight) hours as needed for nausea or vomiting., Disp: 20 tablet, Rfl: 0   propranolol (INDERAL) 20 MG tablet, Take 20 mg by mouth daily., Disp: , Rfl:    QUEtiapine (SEROQUEL) 50 MG tablet, Take 50 mg by mouth as needed. , Disp: , Rfl:    rosuvastatin (CRESTOR) 5 MG tablet, Take 1 tablet (5 mg total) by mouth at bedtime., Disp: 90 tablet, Rfl: 3   blood glucose meter kit and supplies KIT, Dispense based on patient and insurance preference.  Use daily prn for blood sugar monitoring for diabetes ICD-10-E11.65 (Patient not taking: Reported on 01/01/2022), Disp: 1 each, Rfl: 0   fluconazole (DIFLUCAN) 150 MG tablet, Take 1 tablet (150 mg total) by mouth every 3 (three) days as needed (for vaginal itching/yeast infection sx). (Patient not taking: Reported on 01/01/2022), Disp: 2 tablet, Rfl: 0   glucose blood test strip, OneTouch Verio test strips (Patient not taking: Reported on 01/01/2022), Disp: , Rfl:    ONETOUCH VERIO test strip, USE AS DIRECTED TO CHECK BLOOD SUGAR ONCE  DAILY. (Patient not taking: Reported on 01/01/2022), Disp: 50 each, Rfl: 6  Allergies  Allergen Reactions   Amoxicillin Other (See Comments)    Chest pain    Clindamycin Swelling   Oysters [Shellfish Allergy]     Dizzy, nauseated   Ziprasidone Hcl Swelling   Amoxicillin-Pot Clavulanate Rash    I personally reviewed active problem list, medication list, allergies with the patient/caregiver today.  ROS  Constitutional: Negative for fever or weight change.  Respiratory: Negative for cough and shortness of breath.   Cardiovascular: Negative for chest pain or palpitations.  Gastrointestinal: Negative for abdominal pain, no bowel changes.  Musculoskeletal: Negative for gait problem or joint swelling.  Skin: Negative for rash.  Neurological: Negative for dizziness or headache.  No other specific complaints in a complete review of systems (except as listed in HPI above).   Objective  Virtual encounter, vitals not obtained.  There is no height or weight on file to calculate BMI.  Nursing Note and Vital Signs reviewed.  Physical Exam  Awake, alert, oriented x3 speaking in complete sentences.  No results found for this or any previous visit (from the past 72 hour(s)).  Assessment & Plan  1. Acute bacterial conjunctivitis of both eyes Comments: We will send in prescription for ciprofloxacin eyedrops.  May use warm compresses to help loosen crusted eyelids.  - ciprofloxacin (CILOXAN) 0.3 % ophthalmic solution; Place 1 to 2 drops every 2-4 hours while awake for 2 days, then 4 times daily for 5 days.  Dispense: 5 mL; Refill: 1  2. Seasonal allergic rhinitis, unspecified trigger Comments: Continue taking allergy medication.    -Red flags and when to present for emergency care or RTC including fever >101.28F, chest pain, shortness of breath, new/worsening/un-resolving symptoms,  reviewed with patient at time of visit. Follow up and care instructions discussed and provided in AVS. - I  discussed the assessment and treatment plan with the patient. The patient was provided an opportunity to ask questions and all were answered. The patient agreed with the plan and demonstrated an understanding of the instructions.  I provided 15 minutes of non-face-to-face time during this encounter.  Bo Merino, FNP

## 2022-01-01 NOTE — Telephone Encounter (Signed)
ciprofloxacin (CILOXAN) 0.3 % ophthalmic solution 5 mL 1 01/01/2022    Sig: Place 1 to 2 drops every 2-4 hours while awake for 2 days, then 4 times daily for 5 days   Gerald Stabs, Pharmacist [facility] calling to have more clarification on script (which eye, both eyes?) fu ask for Gerald Stabs [phone]

## 2022-01-10 ENCOUNTER — Ambulatory Visit (INDEPENDENT_AMBULATORY_CARE_PROVIDER_SITE_OTHER): Payer: BC Managed Care – PPO | Admitting: Family Medicine

## 2022-01-10 ENCOUNTER — Encounter: Payer: Self-pay | Admitting: Family Medicine

## 2022-01-10 VITALS — BP 141/80 | HR 80 | Ht 66.0 in | Wt 246.0 lb

## 2022-01-10 DIAGNOSIS — Z01419 Encounter for gynecological examination (general) (routine) without abnormal findings: Secondary | ICD-10-CM

## 2022-01-10 NOTE — Progress Notes (Signed)
Subjective:     Caitlin Gomez is a 41 y.o. female and is here for a comprehensive physical exam. The patient reports no problems.      The following portions of the patient's history were reviewed and updated as appropriate: allergies, current medications, past family history, past medical history, past social history, past surgical history, and problem list.  Review of Systems Pertinent items noted in HPI and remainder of comprehensive ROS otherwise negative.   Objective:    BP (!) 141/80   Pulse 80   Ht '5\' 6"'$  (1.676 m)   Wt 246 lb (111.6 kg)   LMP 12/23/2021   BMI 39.71 kg/m  General appearance: alert, cooperative, and appears stated age Head: Normocephalic, without obvious abnormality, atraumatic Neck: no adenopathy, supple, symmetrical, trachea midline, and thyroid not enlarged, symmetric, no tenderness/mass/nodules Lungs: clear to auscultation bilaterally Breasts: normal appearance, no masses or tenderness Heart: regular rate and rhythm, S1, S2 normal, no murmur, click, rub or gallop Abdomen: soft, non-tender; bowel sounds normal; no masses,  no organomegaly Extremities: extremities normal, atraumatic, no cyanosis or edema Pulses: 2+ and symmetric Skin: Skin color, texture, turgor normal. No rashes or lesions Lymph nodes: Cervical, supraclavicular, and axillary nodes normal. Neurologic: Grossly normal    Assessment:    Healthy female exam.      Plan:  Encounter for gynecological examination without abnormal finding - Normal pap last year.  Return in 1 year (on 01/11/2023).   See After Visit Summary for Counseling Recommendations

## 2022-01-10 NOTE — Patient Instructions (Signed)

## 2022-01-10 NOTE — Progress Notes (Signed)
Annual   LMP: 12/23/21.  Last pap: 06/30/222. WNL   CC: Vaginal itching no discharge. Pt recently finished antibiotics yet notes recurrent sx's throughout the year.

## 2022-01-17 ENCOUNTER — Other Ambulatory Visit: Payer: Self-pay | Admitting: Unknown Physician Specialty

## 2022-01-17 DIAGNOSIS — I10 Essential (primary) hypertension: Secondary | ICD-10-CM

## 2022-01-18 NOTE — Telephone Encounter (Signed)
Requested Prescriptions  Pending Prescriptions Disp Refills  . lisinopril (ZESTRIL) 10 MG tablet [Pharmacy Med Name: LISINOPRIL 10 MG TAB] 90 tablet 0    Sig: TAKE 1 TABLET BY MOUTH ONCE DAILY     Cardiovascular:  ACE Inhibitors Failed - 01/17/2022  1:39 PM      Failed - Last BP in normal range    BP Readings from Last 1 Encounters:  01/10/22 (!) 141/80         Passed - Cr in normal range and within 180 days    Creat  Date Value Ref Range Status  10/02/2021 0.79 0.50 - 0.97 mg/dL Final   Creatinine, Urine  Date Value Ref Range Status  05/04/2009 90.7 mg/dL Final         Passed - K in normal range and within 180 days    Potassium  Date Value Ref Range Status  10/02/2021 5.0 3.5 - 5.3 mmol/L Final  11/21/2012 3.5 3.5 - 5.1 mmol/L Final         Passed - Patient is not pregnant      Passed - Valid encounter within last 6 months    Recent Outpatient Visits          2 weeks ago Acute bacterial conjunctivitis of both eyes   [facility] Bo Merino, FNP   2 months ago Left lower quadrant abdominal pain   [facility] Delsa Grana, PA-C   3 months ago Annual physical exam   [facility] Delsa Grana, PA-C   10 months ago Type 2 diabetes mellitus without complication, without long-term current use of insulin [facility])   [facility] [place], Malachy Mood, NP   1 year ago Type 2 diabetes mellitus with hyperglycemia, without long-term current use of insulin [facility])   [facility] Kathrine Haddock, NP

## 2022-02-08 ENCOUNTER — Ambulatory Visit: Payer: BC Managed Care – PPO | Admitting: Family Medicine

## 2022-03-28 ENCOUNTER — Encounter: Payer: Self-pay | Admitting: Family Medicine

## 2022-03-28 ENCOUNTER — Ambulatory Visit: Payer: BC Managed Care – PPO | Admitting: Family Medicine

## 2022-03-28 NOTE — Progress Notes (Signed)
Patient did not keep appointment today. She may call to reschedule.  

## 2022-04-09 ENCOUNTER — Other Ambulatory Visit: Payer: Self-pay

## 2022-04-10 ENCOUNTER — Encounter: Payer: Self-pay | Admitting: Gastroenterology

## 2022-04-10 ENCOUNTER — Ambulatory Visit: Payer: BC Managed Care – PPO | Admitting: Gastroenterology

## 2022-04-10 ENCOUNTER — Other Ambulatory Visit: Payer: Self-pay

## 2022-04-10 VITALS — BP 115/60 | HR 79 | Temp 98.8°F | Ht 66.0 in | Wt 245.0 lb

## 2022-04-10 DIAGNOSIS — Z8719 Personal history of other diseases of the digestive system: Secondary | ICD-10-CM

## 2022-04-10 MED ORDER — NA SULFATE-K SULFATE-MG SULF 17.5-3.13-1.6 GM/177ML PO SOLN: 354.0000 mL | Freq: Once | ORAL | 0 refills | Status: AC

## 2022-04-10 NOTE — Progress Notes (Signed)
Cephas Darby, MD [address]  [place]  [place], [place] [address]  Main: [phone]  Fax: [phone]    Gastroenterology Consultation  Referring Provider:     Delsa Grana, PA-C Primary Care Physician:  Delsa Grana, PA-C Primary Gastroenterologist:  Dr. Cephas Darby Reason for Consultation:   History of recurrent colonic diverticulitis        HPI:   Caitlin Gomez is a 41 y.o. female referred by Delsa Grana, PA-C  for consultation & management of history of recurrent colonic diverticulitis.  Patient has history of metabolic syndrome, reports that she had 4 episodes of diverticulitis in her lifetime within last 10 to 15 years.  She does report intermittent constipation.  She is a stress eater.  Her last episode was in June, treated with Cipro and Flagyl by her PCP.  Currently, she denies any GI symptoms  NSAIDs: None  Antiplts/Anticoagulants/Anti thrombotics: None  GI Procedures: None  Past Medical History:  Diagnosis Date   Bipolar 1 disorder ([place])    Chicken pox    Depression    Diabetes mellitus without complication ([place])    Diverticulitis    12/2015   Eating disorder    Genital warts    GERD (gastroesophageal reflux disease)    Hypertension    PTSD (post-traumatic stress disorder)     Past Surgical History:  Procedure Laterality Date   TONSILLECTOMY AND ADENOIDECTOMY     WISDOM TOOTH EXTRACTION       Current Outpatient Medications:    albuterol (VENTOLIN HFA) 108 (90 Base) MCG/ACT inhaler, Inhale 2 puffs into the lungs every 6 (six) hours as needed for wheezing or shortness of breath., Disp: 18 g, Rfl: 1   EPINEPHrine (EPIPEN 2-PAK) 0.3 mg/0.3 mL IJ SOAJ injection, Inject 0.3 mg into the muscle as needed., Disp: , Rfl:    fexofenadine (ALLEGRA ALLERGY) 180 MG tablet, Take 180 mg by mouth daily., Disp: , Rfl:    hydrOXYzine (ATARAX) 10 MG tablet, Take 10 mg by mouth every 4 (four) hours as needed., Disp: , Rfl:    lamoTRIgine (LAMICTAL) 100  MG tablet, Take 250 mg by mouth daily. , Disp: , Rfl:    lisinopril (ZESTRIL) 10 MG tablet, TAKE 1 TABLET BY MOUTH ONCE DAILY, Disp: 90 tablet, Rfl: 0   metFORMIN (GLUCOPHAGE) 500 MG tablet, Take 2 tablets (1,000 mg total) by mouth 2 (two) times daily with a meal., Disp: 360 tablet, Rfl: 1   Na Sulfate-K Sulfate-Mg Sulf 17.5-3.13-1.6 GM/177ML SOLN, Take 354 mLs by mouth once for 1 dose., Disp: 354 mL, Rfl: 0   propranolol (INDERAL) 20 MG tablet, Take 20 mg by mouth daily., Disp: , Rfl:    QUEtiapine (SEROQUEL) 50 MG tablet, Take 50 mg by mouth as needed. , Disp: , Rfl:    Family History  Problem Relation Age of Onset   Stroke Paternal Grandfather    Diabetes Paternal Grandfather    Diabetes Paternal Grandmother    Early death Maternal Grandmother    Mental illness Maternal Grandmother    Diabetes Father    Alcohol abuse Mother    Hyperlipidemia Mother    Hypertension Mother    Mental illness Mother    Diabetes Mother    Breast cancer Paternal Aunt    Mental illness Paternal Aunt    Cancer Paternal Aunt        lung   Breast cancer Paternal Aunt 16     Social History   Tobacco  Use   Smoking status: Former    Types: Cigarettes   Smokeless tobacco: Never  Vaping Use   Vaping Use: Never used  Substance Use Topics   Alcohol use: Not Currently   Drug use: No    Allergies as of 04/10/2022 - Review Complete 01/10/2022  Allergen Reaction Noted   Amoxicillin Other (See Comments) 08/10/2014   Clindamycin Swelling 08/10/2014   Oysters [shellfish allergy]  06/06/2016   Ziprasidone hcl Swelling 11/26/2013   Amoxicillin-pot clavulanate Rash 08/13/2013    Review of Systems:    All systems reviewed and negative except where noted in HPI.   Physical Exam:  BP 115/60 (BP Location: Left Arm, Patient Position: Sitting, Cuff Size: Large)   Pulse 79   Temp 98.8 F (37.1 C) (Oral)   Ht '5\' 6"'$  (1.676 m)   Wt 245 lb (111.1 kg)   BMI 39.54 kg/m  No LMP recorded.  General:   Alert,   Well-developed, well-nourished, pleasant and cooperative in NAD Head:  Normocephalic and atraumatic. Eyes:  Sclera clear, no icterus.   Conjunctiva pink. Ears:  Normal auditory acuity. Nose:  No deformity, discharge, or lesions. Mouth:  No deformity or lesions,oropharynx pink & moist. Neck:  Supple; no masses or thyromegaly. Lungs:  Respirations even and unlabored.  Clear throughout to auscultation.   No wheezes, crackles, or rhonchi. No acute distress. Heart:  Regular rate and rhythm; no murmurs, clicks, rubs, or gallops. Abdomen:  Normal bowel sounds. Soft, obese, non-tender and non-distended without masses, hepatosplenomegaly or hernias noted.  No guarding or rebound tenderness.   Rectal: Not performed Msk:  Symmetrical without gross deformities. Good, equal movement & strength bilaterally. Pulses:  Normal pulses noted. Extremities:  No clubbing or edema.  No cyanosis. Neurologic:  Alert and oriented x3;  grossly normal neurologically. Skin:  Intact without significant lesions or rashes. No jaundice. Psych:  Alert and cooperative. Normal mood and affect.  Imaging Studies: Reviewed  Assessment and Plan:   LYLLIANA KITAMURA is a 41 y.o. pleasant Caucasian female with history of metabolic syndrome, history of recurrent colonic diverticulitis, uncomplicated is seen in consultation for recurrent colonic diverticulitis and discuss about colonoscopy  Recommend diagnostic colonoscopy Discussed with patient regarding management of uncomplicated recurrent diverticulitis, how to prevent recurrent episodes, rule of antibiotics and how to manage diverticulitis without antibiotics. Discussed about importance of healthy lifestyle, tight control of diabetes   Follow up based on the colonoscopy results   Cephas Darby, MD

## 2022-04-19 ENCOUNTER — Encounter: Payer: Self-pay | Admitting: Family Medicine

## 2022-04-19 ENCOUNTER — Ambulatory Visit (INDEPENDENT_AMBULATORY_CARE_PROVIDER_SITE_OTHER): Payer: BC Managed Care – PPO | Admitting: Family Medicine

## 2022-04-19 ENCOUNTER — Other Ambulatory Visit: Payer: Self-pay | Admitting: Internal Medicine

## 2022-04-19 VITALS — BP 150/106 | HR 81 | Temp 98.3°F | Resp 16 | Ht 66.0 in | Wt 242.3 lb

## 2022-04-19 DIAGNOSIS — I10 Essential (primary) hypertension: Secondary | ICD-10-CM | POA: Diagnosis not present

## 2022-04-19 DIAGNOSIS — Z6841 Body Mass Index (BMI) 40.0 and over, adult: Secondary | ICD-10-CM

## 2022-04-19 DIAGNOSIS — E7800 Pure hypercholesterolemia, unspecified: Secondary | ICD-10-CM | POA: Diagnosis not present

## 2022-04-19 DIAGNOSIS — Z5181 Encounter for therapeutic drug level monitoring: Secondary | ICD-10-CM

## 2022-04-19 DIAGNOSIS — E119 Type 2 diabetes mellitus without complications: Secondary | ICD-10-CM | POA: Diagnosis not present

## 2022-04-19 DIAGNOSIS — Z23 Encounter for immunization: Secondary | ICD-10-CM

## 2022-04-19 NOTE — Telephone Encounter (Signed)
Requested Prescriptions  Pending Prescriptions Disp Refills   lisinopril (ZESTRIL) 10 MG tablet [Pharmacy Med Name: LISINOPRIL 10 MG TAB] 90 tablet 0    Sig: TAKE 1 TABLET BY MOUTH ONCE DAILY     Cardiovascular:  ACE Inhibitors Failed - 04/19/2022 11:33 AM      Failed - Cr in normal range and within 180 days    Creat  Date Value Ref Range Status  10/02/2021 0.79 0.50 - 0.97 mg/dL Final   Creatinine, Urine  Date Value Ref Range Status  05/04/2009 90.7 mg/dL Final         Failed - K in normal range and within 180 days    Potassium  Date Value Ref Range Status  10/02/2021 5.0 3.5 - 5.3 mmol/L Final  11/21/2012 3.5 3.5 - 5.1 mmol/L Final         Passed - Patient is not pregnant      Passed - Last BP in normal range    BP Readings from Last 1 Encounters:  04/10/22 115/60         Passed - Valid encounter within last 6 months    Recent Outpatient Visits           Today Type 2 diabetes mellitus without complication, without long-term current use of insulin [facility])   [facility] Delsa Grana, PA-C   3 months ago Acute bacterial conjunctivitis of both eyes   [facility] Bo Merino, FNP   5 months ago Left lower quadrant abdominal pain   [facility] Delsa Grana, PA-C   6 months ago Annual physical exam   [facility] Delsa Grana, PA-C   1 year ago Type 2 diabetes mellitus without complication, without long-term current use of insulin [facility])   [facility] Kathrine Haddock, NP
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
## 2022-04-20 ENCOUNTER — Other Ambulatory Visit: Payer: Self-pay | Admitting: Family Medicine

## 2022-04-20 DIAGNOSIS — E119 Type 2 diabetes mellitus without complications: Secondary | ICD-10-CM

## 2022-04-20 DIAGNOSIS — E7800 Pure hypercholesterolemia, unspecified: Secondary | ICD-10-CM

## 2022-04-20 LAB — LIPID PANEL
Cholesterol: 206 mg/dL — ABNORMAL HIGH (ref <200)
HDL: 40 mg/dL — ABNORMAL LOW (ref >=50)
LDL Cholesterol (Calc): 141 mg/dL (calc) — ABNORMAL HIGH
Non-HDL Cholesterol (Calc): 166 mg/dL (calc) — ABNORMAL HIGH (ref <130)
Total CHOL/HDL Ratio: 5.2 (calc) — ABNORMAL HIGH (ref <5.0)
Triglycerides: 127 mg/dL (ref <150)

## 2022-04-20 LAB — MICROALBUMIN / CREATININE URINE RATIO
Creatinine, Urine: 133 mg/dL (ref 20–275)
Microalb Creat Ratio: 26 mcg/mg creat (ref <30)
Microalb, Ur: 3.4 mg/dL

## 2022-04-20 LAB — CBC WITH DIFFERENTIAL/PLATELET
Absolute Monocytes: 601 cells/uL (ref 200–950)
Basophils Absolute: 91 cells/uL (ref 0–200)
Basophils Relative: 1 %
Eosinophils Absolute: 291 cells/uL (ref 15–500)
Eosinophils Relative: 3.2 %
HCT: 38.4 % (ref 35.0–45.0)
Hemoglobin: 13.4 g/dL (ref 11.7–15.5)
Lymphs Abs: 2739 cells/uL (ref 850–3900)
MCH: 29.4 pg (ref 27.0–33.0)
MCHC: 34.9 g/dL (ref 32.0–36.0)
MCV: 84.2 fL (ref 80.0–100.0)
MPV: 9.4 fL (ref 7.5–12.5)
Monocytes Relative: 6.6 %
Neutro Abs: 5378 cells/uL (ref 1500–7800)
Neutrophils Relative %: 59.1 %
Platelets: 392 10*3/uL (ref 140–400)
RBC: 4.56 10*6/uL (ref 3.80–5.10)
RDW: 12.9 % (ref 11.0–15.0)
Total Lymphocyte: 30.1 %
WBC: 9.1 10*3/uL (ref 3.8–10.8)

## 2022-04-20 LAB — COMPLETE METABOLIC PANEL WITH GFR
AG Ratio: 1.5 (calc) (ref 1.0–2.5)
ALT: 14 U/L (ref 6–29)
AST: 10 U/L (ref 10–30)
Albumin: 4.3 g/dL (ref 3.6–5.1)
Alkaline phosphatase (APISO): 41 U/L (ref 31–125)
BUN: 12 mg/dL (ref 7–25)
CO2: 27 mmol/L (ref 20–32)
Calcium: 9.7 mg/dL (ref 8.6–10.2)
Chloride: 102 mmol/L (ref 98–110)
Creat: 0.76 mg/dL (ref 0.50–0.97)
Globulin: 2.8 g/dL (calc) (ref 1.9–3.7)
Glucose, Bld: 173 mg/dL — ABNORMAL HIGH (ref 65–99)
Potassium: 4.7 mmol/L (ref 3.5–5.3)
Sodium: 136 mmol/L (ref 135–146)
Total Bilirubin: 0.5 mg/dL (ref 0.2–1.2)
Total Protein: 7.1 g/dL (ref 6.1–8.1)
eGFR: 102 mL/min/{1.73_m2} (ref >=60)

## 2022-04-20 LAB — HEMOGLOBIN A1C
Hgb A1c MFr Bld: 7.9 % of total Hgb — ABNORMAL HIGH (ref <5.7)
Mean Plasma Glucose: 180 mg/dL
eAG (mmol/L): 10 mmol/L

## 2022-04-20 MED ORDER — METFORMIN HCL 1000 MG PO TABS: 1000.0000 mg | ORAL_TABLET | Freq: Two times a day (BID) | ORAL | 3 refills | Status: DC

## 2022-04-20 MED ORDER — ROSUVASTATIN CALCIUM 5 MG PO TABS: 5.0000 mg | ORAL_TABLET | Freq: Every day | ORAL | 3 refills | Status: DC

## 2022-04-25 ENCOUNTER — Encounter: Payer: Self-pay | Admitting: Family Medicine
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
## 2022-04-25 NOTE — Assessment & Plan Note (Signed)
See discussion above with type 2 diabetes diagnosis She may consider diabetes education or registered dietitian consult

## 2022-04-25 NOTE — Assessment & Plan Note (Signed)
History of being uncontrolled, taking metformin only a Denies: Polyuria, polydipsia, vision changes, neuropathy, hypoglycemia Recent pertinent labs: Lab Results  Component Value Date   HGBA1C 8.7 (H) 10/02/2021   HGBA1C 6.4 (A) 02/23/2021   Lab Results  Component Value Date   MICROALBUR 3.4 04/19/2022   LDLCALC 141 (H) 04/19/2022   CREATININE 0.76 04/19/2022  She is not currently taking a statin but is considering starting -she would like labs rechecked Discussed other medications for managing diabetes -she is prone to urine and vaginal infections Jardiance or Farxiga as not likely a good option for her, glipizide or insulin products would increase her weight which she would like to avoid.  Actos may be an option, and GLP-1's were discussed at length today Her mother was admitted to the hospital with abdominal pain, jaundice and passed away very suddenly from what they think may have been pancreatic cancer or bile duct cancer they did not do an autopsy and the patient is concerned about if a GLP-1 is a safe option for her or not It would be great for her history of insulin resistance, obesity, hirsutism and other metabolic dysfunction and PCOS - would likely help significantly with weight and glycemic control

## 2022-04-25 NOTE — Assessment & Plan Note (Signed)
Recheck cholesterol level, patient seems more open to starting a medication if her labs are still abnormal

## 2022-05-21 ENCOUNTER — Ambulatory Visit
Admission: RE | Admit: 2022-05-21 | Discharge: 2022-05-21 | Disposition: A | Discharge status: Home / Self Care | Payer: BC Managed Care – PPO | Attending: Gastroenterology | Admitting: Gastroenterology

## 2022-05-21 ENCOUNTER — Encounter
Admission: RE | Disposition: A | Discharge status: Home / Self Care | Payer: Self-pay | Source: Home / Self Care | Attending: Gastroenterology

## 2022-05-21 ENCOUNTER — Ambulatory Visit: Payer: BC Managed Care – PPO | Admitting: Registered Nurse

## 2022-05-21 ENCOUNTER — Encounter: Payer: Self-pay | Admitting: Gastroenterology

## 2022-05-21 DIAGNOSIS — K5732 Diverticulitis of large intestine without perforation or abscess without bleeding: Secondary | ICD-10-CM | POA: Insufficient documentation

## 2022-05-21 DIAGNOSIS — E119 Type 2 diabetes mellitus without complications: Secondary | ICD-10-CM | POA: Diagnosis not present

## 2022-05-21 DIAGNOSIS — K635 Polyp of colon: Secondary | ICD-10-CM

## 2022-05-21 DIAGNOSIS — I10 Essential (primary) hypertension: Secondary | ICD-10-CM | POA: Diagnosis not present

## 2022-05-21 DIAGNOSIS — F319 Bipolar disorder, unspecified: Secondary | ICD-10-CM | POA: Insufficient documentation

## 2022-05-21 DIAGNOSIS — Z8719 Personal history of other diseases of the digestive system: Secondary | ICD-10-CM | POA: Diagnosis not present

## 2022-05-21 DIAGNOSIS — D125 Benign neoplasm of sigmoid colon: Secondary | ICD-10-CM | POA: Insufficient documentation

## 2022-05-21 DIAGNOSIS — K219 Gastro-esophageal reflux disease without esophagitis: Secondary | ICD-10-CM | POA: Insufficient documentation

## 2022-05-21 DIAGNOSIS — Z87891 Personal history of nicotine dependence: Secondary | ICD-10-CM | POA: Diagnosis not present

## 2022-05-21 HISTORY — PX: COLONOSCOPY WITH PROPOFOL: SHX5780

## 2022-05-21 LAB — GLUCOSE, CAPILLARY: Glucose-Capillary: 191 mg/dL — ABNORMAL HIGH (ref 70–99)

## 2022-05-21 SURGERY — COLONOSCOPY WITH PROPOFOL
Anesthesia: General

## 2022-05-21 MED ORDER — PROPOFOL 500 MG/50ML IV EMUL
INTRAVENOUS | Status: DC | PRN
  Administered 2022-05-21: 150 ug/kg/min via INTRAVENOUS

## 2022-05-21 MED ORDER — PROPOFOL 10 MG/ML IV BOLUS
INTRAVENOUS | Status: DC | PRN
  Administered 2022-05-21: 80 mg via INTRAVENOUS

## 2022-05-21 MED ORDER — SODIUM CHLORIDE 0.9 % IV SOLN: INTRAVENOUS | Status: DC

## 2022-05-21 MED ORDER — LIDOCAINE HCL (CARDIAC) PF 100 MG/5ML IV SOSY
PREFILLED_SYRINGE | INTRAVENOUS | Status: DC | PRN
  Administered 2022-05-21: 40 mg via INTRAVENOUS

## 2022-05-21 MED ORDER — LIDOCAINE HCL (PF) 2 % IJ SOLN
INTRAMUSCULAR | Status: AC
  Filled 2022-05-21: qty 5

## 2022-05-21 MED ORDER — PROPOFOL 1000 MG/100ML IV EMUL
INTRAVENOUS | Status: AC
  Filled 2022-05-21: qty 100

## 2022-05-21 NOTE — Transfer of Care (Signed)
Immediate Anesthesia Transfer of Care Note  Patient: Caitlin Gomez  Procedure(s) Performed: Procedure(s): COLONOSCOPY WITH PROPOFOL (N/A)  Patient Location: PACU and Endoscopy Unit  Anesthesia Type:General  Level of Consciousness: sedated  Airway & Oxygen Therapy: Patient Spontanous Breathing and Patient connected to nasal cannula oxygen  Post-op Assessment: Report given to RN and Post -op Vital signs reviewed and stable  Post vital signs: Reviewed and stable  Last Vitals:  Vitals:   05/21/22 0903 05/21/22 0913  BP: 131/82 (!) 144/91  Pulse: 83 76  Resp: 20 20  Temp:    SpO2: 01% 007%    Complications: No apparent anesthesia complications

## 2022-05-21 NOTE — Anesthesia Preprocedure Evaluation (Addendum)
Anesthesia Evaluation  Patient identified by MRN, date of birth, ID band Patient awake    Reviewed: Allergy & Precautions, H&P , NPO status , Patient's Chart, lab work & pertinent test results, reviewed documented beta blocker date and time   History of Anesthesia Complications Negative for: history of anesthetic complications  Airway Mallampati: II  TM Distance: >3 FB Neck ROM: full    Dental  (+) Dental Advidsory Given, Missing, Teeth Intact   Pulmonary neg pulmonary ROS, former smoker   Pulmonary exam normal breath sounds clear to auscultation       Cardiovascular Exercise Tolerance: Good hypertension, (-) angina (-) Past MI and (-) Cardiac Stents Normal cardiovascular exam(-) dysrhythmias (-) Valvular Problems/Murmurs Rhythm:regular Rate:Normal     Neuro/Psych  PSYCHIATRIC DISORDERS Anxiety Depression Bipolar Disorder   negative neurological ROS     GI/Hepatic Neg liver ROS,GERD  ,,  Endo/Other  diabetes    Renal/GU negative Renal ROS  negative genitourinary   Musculoskeletal   Abdominal   Peds  Hematology negative hematology ROS (+)   Anesthesia Other Findings Past Medical History: No date: Bipolar 1 disorder ([place]) No date: Chicken pox No date: Depression No date: Diabetes mellitus without complication ([facility]) No date: Diverticulitis     Comment:  12/2015 No date: Eating disorder 04/14/2021: Fracture of phalanx of finger No date: Genital warts No date: GERD (gastroesophageal reflux disease) No date: Hypertension No date: PTSD (post-traumatic stress disorder)   Reproductive/Obstetrics negative OB ROS                             Anesthesia Physical Anesthesia Plan  ASA: 3  Anesthesia Plan: General   Post-op Pain Management:    Induction: Intravenous  PONV Risk Score and Plan: 3 and Propofol infusion and TIVA  Airway Management Planned: Natural Airway and Nasal  Cannula  Additional Equipment:   Intra-op Plan:   Post-operative Plan:   Informed Consent: I have reviewed the patients History and Physical, chart, labs and discussed the procedure including the risks, benefits and alternatives for the proposed anesthesia with the patient or authorized representative who has indicated his/her understanding and acceptance.     Dental Advisory Given  Plan Discussed with: Anesthesiologist, CRNA and Surgeon  Anesthesia Plan Comments:        Anesthesia Quick Evaluation

## 2022-05-21 NOTE — Op Note (Signed)
[facility] Patient Name: Caitlin Gomez Procedure Date: 05/21/2022 8:17 AM MRN: [id number] Account #: [id number] Date of Birth: April 23, 1981 Admit Type: Outpatient Age: 41 Room: [facility] ENDO ROOM 4 Gender: Female Note Status: Finalized Instrument Name: Colonoscope [id number] Procedure:             Colonoscopy Indications:           This is the patient's first colonoscopy, Follow-up of                         diverticulitis Providers:             Lin Landsman MD, MD Referring MD:          Delsa Grana (Referring MD) Medicines:             General Anesthesia Complications:         No immediate complications. Estimated blood loss: None. Procedure:             Pre-Anesthesia Assessment:                        - Prior to the procedure, a History and Physical was                         performed, and patient medications and allergies were                         reviewed. The patient is competent. The risks and                         benefits of the procedure and the sedation options and                         risks were discussed with the patient. All questions                         were answered and informed consent was obtained.                         Patient identification and proposed procedure were                         verified by the physician, the nurse, the                         anesthesiologist, the anesthetist and the technician                         in the pre-procedure area in the procedure room in the                         endoscopy suite. Mental Status Examination: alert and                         oriented. Airway Examination: normal oropharyngeal                         airway and neck mobility. Respiratory Examination:  clear to auscultation. CV Examination: normal.                         Prophylactic Antibiotics: The patient does not require                         prophylactic antibiotics. Prior  Anticoagulants: The                         patient has taken no anticoagulant or antiplatelet                         agents. ASA Grade Assessment: III - A patient with                         severe systemic disease. After reviewing the risks and                         benefits, the patient was deemed in satisfactory                         condition to undergo the procedure. The anesthesia                         plan was to use general anesthesia. Immediately prior                         to administration of medications, the patient was                         re-assessed for adequacy to receive sedatives. The                         heart rate, respiratory rate, oxygen saturations,                         blood pressure, adequacy of pulmonary ventilation, and                         response to care were monitored throughout the                         procedure. The physical status of the patient was                         re-assessed after the procedure.                        After obtaining informed consent, the colonoscope was                         passed under direct vision. Throughout the procedure,                         the patient's blood pressure, pulse, and oxygen                         saturations were monitored continuously. The  Colonoscope was introduced through the anus and                         advanced to the the cecum, identified by appendiceal                         orifice and ileocecal valve. The colonoscopy was                         performed without difficulty. The patient tolerated                         the procedure well. The quality of the bowel                         preparation was evaluated using the BBPS [facility] Bowel                         Preparation Scale) with scores of: Right Colon = 3,                         Transverse Colon = 3 and Left Colon = 3 (entire mucosa                         seen well with no  residual staining, small fragments                         of stool or opaque liquid). The total BBPS score                         equals 9. The ileocecal valve, appendiceal orifice,                         and rectum were photographed. Findings:      The perianal and digital rectal examinations were normal. Pertinent       negatives include normal sphincter tone and no palpable rectal lesions.      A 5 mm polyp was found in the sigmoid colon. The polyp was sessile. The       polyp was removed with a cold snare. Resection and retrieval were       complete. Estimated blood loss was minimal. To prevent bleeding after       the polypectomy, one hemostatic clip was successfully placed (MR safe).       Clip manufacturer: [employer]. There was no bleeding at the end       of the procedure.      Multiple large-mouthed diverticula were found in the sigmoid colon.       There was no evidence of diverticular bleeding.      The retroflexed view of the distal rectum and anal verge was normal and       showed no anal or rectal abnormalities.      The exam was otherwise without abnormality on direct and retroflexion       views. Impression:            - One 5 mm polyp in the sigmoid colon, removed with a  cold snare. Resected and retrieved. Clip manufacturer:                         [employer]. Clip (MR safe) was placed.                        - Severe diverticulosis in the sigmoid colon. There                         was no evidence of diverticular bleeding.                        - The distal rectum and anal verge are normal on                         retroflexion view.                        - The examination was otherwise normal on direct and                         retroflexion views. Recommendation:        - Discharge patient to home (with escort).                        - Resume previous diet today.                        - Continue present medications.                         - Await pathology results.                        - Repeat colonoscopy in 7-10 years for surveillance                         based on pathology results. Procedure Code(s):     --- Professional ---                        [id number], Colonoscopy, flexible; with removal of                         tumor(s), polyp(s), or other lesion(s) by snare                         technique Diagnosis Code(s):     --- Professional ---                        D12.5, Benign neoplasm of sigmoid colon                        K57.32, Diverticulitis of large intestine without                         perforation or abscess without bleeding                        K57.30, Diverticulosis of large intestine without  perforation or abscess without bleeding CPT copyright 2022 American Medical Association. All rights reserved. The codes documented in this report are preliminary and upon coder review may  be revised to meet current compliance requirements. Dr. Ulyess Mort Lin Landsman MD, MD 05/21/2022 8:52:38 AM This report has been signed electronically. Number of Addenda: 0 Note Initiated On: 05/21/2022 8:17 AM Scope Withdrawal Time: 0 hours 9 minutes 17 seconds  Total Procedure Duration: 0 hours 12 minutes 19 seconds  Estimated Blood Loss:  Estimated blood loss: none.      [facility]

## 2022-05-21 NOTE — Anesthesia Procedure Notes (Signed)
Date/Time: 05/21/2022 8:35 AM  Performed by: Doreen Salvage, CRNAPre-anesthesia Checklist: Patient identified, Emergency Drugs available, Suction available and Patient being monitored Patient Re-evaluated:Patient Re-evaluated prior to induction Oxygen Delivery Method: Nasal cannula Induction Type: IV induction Dental Injury: Teeth and Oropharynx as per pre-operative assessment  Comments: Nasal cannula with etCO2 monitoring

## 2022-05-21 NOTE — OR Nursing (Signed)
Neg pregnancy

## 2022-05-21 NOTE — Anesthesia Postprocedure Evaluation (Signed)
Anesthesia Post Note  Patient: Caitlin Gomez  Procedure(s) Performed: COLONOSCOPY WITH PROPOFOL  Patient location during evaluation: Endoscopy Anesthesia Type: General Level of consciousness: awake and alert Pain management: pain level controlled Vital Signs Assessment: post-procedure vital signs reviewed and stable Respiratory status: spontaneous breathing, nonlabored ventilation, respiratory function stable and patient connected to nasal cannula oxygen Cardiovascular status: blood pressure returned to baseline and stable Postop Assessment: no apparent nausea or vomiting Anesthetic complications: no   No notable events documented.   Last Vitals:  Vitals:   05/21/22 0903 05/21/22 0913  BP: 131/82 (!) 144/91  Pulse: 83 76  Resp: 20 20  Temp:    SpO2: 99% 100%    Last Pain:  Vitals:   05/21/22 0913  TempSrc:   PainSc: 0-No pain                 Martha Clan

## 2022-05-21 NOTE — H&P (Signed)
Cephas Darby, MD [address]  [place]  [place], [place] [address]  Main: [phone]  Fax: [phone] Pager: [phone]  Primary Care Physician:  Delsa Grana, PA-C Primary Gastroenterologist:  Dr. Cephas Darby  Pre-Procedure History & Physical: HPI:  Caitlin Gomez is a 41 y.o. female is here for an colonoscopy.   Past Medical History:  Diagnosis Date   Bipolar 1 disorder ([place])    Chicken pox    Depression    Diabetes mellitus without complication ([place])    Diverticulitis    12/2015   Eating disorder    Fracture of phalanx of finger 04/14/2021   Genital warts    GERD (gastroesophageal reflux disease)    Hypertension    PTSD (post-traumatic stress disorder)     Past Surgical History:  Procedure Laterality Date   TONSILLECTOMY AND ADENOIDECTOMY     WISDOM TOOTH EXTRACTION      Prior to Admission medications   Medication Sig Start Date End Date Taking? Authorizing Provider  lamoTRIgine (LAMICTAL) 100 MG tablet Take 250 mg by mouth daily.    Yes [provider]  lisinopril (ZESTRIL) 10 MG tablet TAKE 1 TABLET BY MOUTH ONCE DAILY 04/19/22  Yes Teodora Medici, DO  metFORMIN (GLUCOPHAGE) 1000 MG tablet Take 1 tablet (1,000 mg total) by mouth 2 (two) times daily with a meal. 04/20/22  Yes Delsa Grana, PA-C  albuterol (VENTOLIN HFA) 108 (90 Base) MCG/ACT inhaler Inhale 2 puffs into the lungs every 6 (six) hours as needed for wheezing or shortness of breath. 03/12/19   Delsa Grana, PA-C  EPINEPHrine (EPIPEN 2-PAK) 0.3 mg/0.3 mL IJ SOAJ injection Inject 0.3 mg into the muscle as needed.    [provider]  fexofenadine (ALLEGRA ALLERGY) 180 MG tablet Take 180 mg by mouth daily.    [provider]  hydrOXYzine (ATARAX) 10 MG tablet Take 10 mg by mouth every 4 (four) hours as needed.    [provider]  Na Sulfate-K Sulfate-Mg Sulf 17.5-3.13-1.6 GM/177ML SOLN Take by mouth. Patient not taking: Reported on 04/19/2022 04/10/22    [provider]  propranolol (INDERAL) 20 MG tablet Take 20 mg by mouth daily. 07/13/20   [provider]  QUEtiapine (SEROQUEL) 50 MG tablet Take 50 mg by mouth as needed.     [provider]  rosuvastatin (CRESTOR) 5 MG tablet Take 1 tablet (5 mg total) by mouth daily. 04/20/22   Delsa Grana, PA-C    Allergies as of 04/11/2022 - Review Complete 04/10/2022  Allergen Reaction Noted   Amoxicillin Other (See Comments) 08/10/2014   Clindamycin Swelling 08/10/2014   Oysters [shellfish allergy]  06/06/2016   Ziprasidone hcl Swelling 11/26/2013   Amoxicillin-pot clavulanate Rash 08/13/2013    Family History  Problem Relation Age of Onset   Cancer Mother 37       pancreatic/bile duct   Alcohol abuse Mother    Hyperlipidemia Mother    Hypertension Mother    Mental illness Mother    Diabetes Mother    Diabetes Father    Breast cancer Paternal Aunt    Mental illness Paternal Aunt    Cancer Paternal Aunt        lung   Breast cancer Paternal Aunt 66   Early death Maternal Grandmother    Mental illness Maternal Grandmother    Diabetes Paternal Grandmother    Stroke Paternal Grandfather    Diabetes Paternal Grandfather     Social History   Socioeconomic History  Marital status: Married    Spouse name: Adam   Number of children: 1   Years of education: 12   Highest education level: Some college, no degree  Occupational History   Occupation: Probation officer, Sales executive  Tobacco Use   Smoking status: Former    Types: Cigarettes   Smokeless tobacco: Never  Scientific laboratory technician Use: Every day  Substance and Sexual Activity   Alcohol use: Not Currently   Drug use: No    Comment: delta 8   Sexual activity: Yes    Birth control/protection: Condom  Other Topics Concern   Not on file  Social History Narrative   Born in [place] and has  Lived here since 41 years old.       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       Reads a lot.       Married.      29 year old  daughter.       Social Determinants of Health   Financial Resource Strain: Low Risk  (10/02/2021)   Overall Financial Resource Strain (CARDIA)    Difficulty of Paying Living Expenses: Not hard at all  Food Insecurity: No Food Insecurity (10/02/2021)   Hunger Vital Sign    Worried About Running Out of Food in the Last Year: Never true    Ran Out of Food in the Last Year: Never true  Transportation Needs: No Transportation Needs (10/02/2021)   PRAPARE - Hydrologist (Medical): No    Lack of Transportation (Non-Medical): No  Physical Activity: Inactive (10/02/2021)   Exercise Vital Sign    Days of Exercise per Week: 0 days    Minutes of Exercise per Session: 0 min  Stress: Stress Concern Present (10/02/2021)   [place]    Feeling of Stress : Very much  Social Connections: Moderately Integrated (10/02/2021)   Social Connection and Isolation Panel [NHANES]    Frequency of Communication with Friends and Family: More than three times a week    Frequency of Social Gatherings with Friends and Family: Once a week    Attends Religious Services: Never    Marine scientist or Organizations: Yes    Attends Music therapist: More than 4 times per year    Marital Status: Married  Human resources officer Violence: Not At Risk (10/02/2021)   Humiliation, Afraid, Rape, and Kick questionnaire    Fear of Current or Ex-Partner: No    Emotionally Abused: No    Physically Abused: No    Sexually Abused: No    Review of Systems: See HPI, otherwise negative ROS  Physical Exam: BP (!) 141/96   Pulse (!) 111   Temp (!) 97.2 F (36.2 C) (Temporal)   Resp 18   Ht 5' 6.5" (1.689 m)   Wt 105.2 kg   SpO2 98%   BMI 36.88 kg/m  General:   Alert,  pleasant and cooperative in NAD Head:  Normocephalic and atraumatic. Neck:  Supple; no masses or thyromegaly. Lungs:  Clear throughout to auscultation.     Heart:  Regular rate and rhythm. Abdomen:  Soft, nontender and nondistended. Normal bowel sounds, without guarding, and without rebound.   Neurologic:  Alert and  oriented x4;  grossly normal neurologically.  Impression/Plan: Caitlin Gomez is here for an colonoscopy to be performed for recurrent colonic diverticulitis   Risks, benefits, limitations, and alternatives regarding  colonoscopy have been  reviewed with the patient.  Questions have been answered.  All parties agreeable.   Sherri Sear, MD  05/21/2022, 8:24 AM

## 2022-05-22 ENCOUNTER — Encounter: Payer: Self-pay | Admitting: Gastroenterology

## 2022-05-22 LAB — SURGICAL PATHOLOGY

## 2022-07-18 ENCOUNTER — Other Ambulatory Visit: Payer: Self-pay | Admitting: Internal Medicine

## 2022-07-18 ENCOUNTER — Other Ambulatory Visit: Payer: Self-pay | Admitting: Family Medicine

## 2022-07-18 DIAGNOSIS — I10 Essential (primary) hypertension: Secondary | ICD-10-CM

## 2022-07-18 NOTE — Telephone Encounter (Signed)
Requested Prescriptions  Pending Prescriptions Disp Refills   lisinopril (ZESTRIL) 10 MG tablet [Pharmacy Med Name: LISINOPRIL 10 MG TAB] 90 tablet 0    Sig: TAKE 1 TABLET BY MOUTH ONCE DAILY     Cardiovascular:  ACE Inhibitors Failed - 07/18/2022 12:56 PM      Failed - Last BP in normal range    BP Readings from Last 1 Encounters:  05/21/22 (!) 144/91         Passed - Cr in normal range and within 180 days    Creat  Date Value Ref Range Status  04/19/2022 0.76 0.50 - 0.97 mg/dL Final   Creatinine, Urine  Date Value Ref Range Status  04/19/2022 133 20 - 275 mg/dL Final         Passed - K in normal range and within 180 days    Potassium  Date Value Ref Range Status  04/19/2022 4.7 3.5 - 5.3 mmol/L Final  11/21/2012 3.5 3.5 - 5.1 mmol/L Final         Passed - Patient is not pregnant      Passed - Valid encounter within last 6 months    Recent Outpatient Visits           3 months ago Type 2 diabetes mellitus without complication, without long-term current use of insulin [facility])   [facility] Delsa Grana, PA-C   6 months ago Acute bacterial conjunctivitis of both eyes   [facility] Bo Merino, FNP   8 months ago Left lower quadrant abdominal pain   [facility] Delsa Grana, PA-C   9 months ago Annual physical exam   [facility] Delsa Grana, PA-C   1 year ago Type 2 diabetes mellitus without complication, without long-term current use of insulin [facility])   [facility] Kathrine Haddock, NP       Future Appointments             In 2 weeks Delsa Grana, [facility], [facility]

## 2022-08-03 ENCOUNTER — Ambulatory Visit: Payer: BC Managed Care – PPO | Admitting: Family Medicine

## 2022-08-03 DIAGNOSIS — E119 Type 2 diabetes mellitus without complications: Secondary | ICD-10-CM

## 2022-08-03 DIAGNOSIS — E7800 Pure hypercholesterolemia, unspecified: Secondary | ICD-10-CM

## 2022-08-03 DIAGNOSIS — Z5181 Encounter for therapeutic drug level monitoring: Secondary | ICD-10-CM

## 2022-08-27 ENCOUNTER — Telehealth (INDEPENDENT_AMBULATORY_CARE_PROVIDER_SITE_OTHER): Payer: BC Managed Care – PPO | Admitting: Family Medicine

## 2022-08-27 ENCOUNTER — Encounter: Payer: Self-pay | Admitting: Family Medicine

## 2022-08-27 DIAGNOSIS — H02844 Edema of left upper eyelid: Secondary | ICD-10-CM | POA: Diagnosis not present

## 2022-08-27 DIAGNOSIS — E1165 Type 2 diabetes mellitus with hyperglycemia: Secondary | ICD-10-CM | POA: Diagnosis not present

## 2022-08-27 DIAGNOSIS — Z7984 Long term (current) use of oral hypoglycemic drugs: Secondary | ICD-10-CM | POA: Diagnosis not present

## 2022-08-27 DIAGNOSIS — S01132A Puncture wound without foreign body of left eyelid and periocular area, initial encounter: Secondary | ICD-10-CM

## 2022-08-27 MED ORDER — SULFAMETHOXAZOLE-TRIMETHOPRIM 800-160 MG PO TABS: 1.0000 | ORAL_TABLET | Freq: Two times a day (BID) | ORAL | 0 refills | Status: AC

## 2022-08-27 NOTE — Progress Notes (Signed)
Name: Caitlin Gomez   MRN: [id number]    DOB: 09/13/1980   Date:08/27/2022       Progress Note  Subjective:    Chief Complaint  Chief Complaint  Patient presents with   Wound Infection    Left Eyebrow piercing swollen along with her eye, she noticed some white/clear stuff coming out and bleed a little bit.    I connected with  [name]  on 08/27/22 at  9:40 AM EDT by a video enabled telemedicine application and verified that I am speaking with the correct person using two identifiers.  I discussed the limitations of evaluation and management by telemedicine and the availability of in person appointments. The patient expressed understanding and agreed to proceed. Staff also discussed with the patient that there may be a patient responsible charge related to this service. Patient Location: home Provider Location: [facility] Additional Individuals present: none  HPI Left eyebrow piercing done in Jan 12th, it did heal up as expected w/in about 8 weeks/2 months, she just snagged it on something and this caused some swelling, tenderness, drainage and crusting The swelling and tenderness have gotten better in the last day Left eyebrow and upper eyelid are still a little swollen but not red or hot to the touch.    Uncontrolled DM she is not checking sugars   Patient Active Problem List   Diagnosis Date Noted   Polyp of sigmoid colon 05/21/2022   Type 2 diabetes mellitus with hyperglycemia, without long-term current use of insulin ([place]) 10/02/2021   Closed fracture of distal phalanx of finger 03/22/2021   Irregular menses 99991111   Metabolic syndrome 99991111   Chronic idiopathic urticaria 05/04/2019   Hypercholesterolemia 03/28/2018   Morbid obesity with BMI of 40.0-44.9, adult ([place]) 09/05/2017   History of diverticulitis 09/05/2017   Incidental lung nodule 02/18/2017   Allergic rhinitis 09/05/2016   Fatty liver 06/10/2016   Bipolar disorder ([place]) 03/05/2016    Essential hypertension 03/05/2016   Type 2 diabetes mellitus without complication, without long-term current use of insulin ([place]) 03/05/2016    Social History   Tobacco Use   Smoking status: Former    Types: Cigarettes   Smokeless tobacco: Never  Substance Use Topics   Alcohol use: Not Currently     Current Outpatient Medications:    albuterol (VENTOLIN HFA) 108 (90 Base) MCG/ACT inhaler, Inhale 2 puffs into the lungs every 6 (six) hours as needed for wheezing or shortness of breath., Disp: 18 g, Rfl: 1   EPINEPHrine (EPIPEN 2-PAK) 0.3 mg/0.3 mL IJ SOAJ injection, Inject 0.3 mg into the muscle as needed., Disp: , Rfl:    fexofenadine (ALLEGRA ALLERGY) 180 MG tablet, Take 180 mg by mouth daily., Disp: , Rfl:    hydrOXYzine (ATARAX) 10 MG tablet, Take 10 mg by mouth every 4 (four) hours as needed., Disp: , Rfl:    lamoTRIgine (LAMICTAL) 100 MG tablet, Take 250 mg by mouth daily. , Disp: , Rfl:    lisinopril (ZESTRIL) 10 MG tablet, TAKE 1 TABLET BY MOUTH ONCE DAILY, Disp: 90 tablet, Rfl: 0   metFORMIN (GLUCOPHAGE) 1000 MG tablet, Take 1 tablet (1,000 mg total) by mouth 2 (two) times daily with a meal., Disp: 180 tablet, Rfl: 3   metFORMIN (GLUCOPHAGE) 500 MG tablet, TAKE 2 TABLETS BY MOUTH TWICE A DAY WITHMEALS, Disp: 360 tablet, Rfl: 0   propranolol (INDERAL) 20 MG tablet, Take 20 mg by mouth daily., Disp: , Rfl:    QUEtiapine (  SEROQUEL) 50 MG tablet, Take 50 mg by mouth as needed. , Disp: , Rfl:    rosuvastatin (CRESTOR) 5 MG tablet, Take 1 tablet (5 mg total) by mouth daily., Disp: 90 tablet, Rfl: 3  Allergies  Allergen Reactions   Amoxicillin Other (See Comments)    Chest pain    Clindamycin Swelling   Oysters [Shellfish Allergy]     Dizzy, nauseated   Ziprasidone Hcl Swelling   Amoxicillin-Pot Clavulanate Rash   Chart Review - allergies, A1C, last appt all reviewed with pt today I personally reviewed active problem list, medication list, allergies, family history, social  history, health maintenance, notes from last encounter, lab results, imaging with the patient/caregiver today.   Review of Systems  Constitutional: Negative.   HENT: Negative.    Eyes: Negative.   Respiratory: Negative.    Cardiovascular: Negative.   Gastrointestinal: Negative.   Endocrine: Negative.   Genitourinary: Negative.   Musculoskeletal: Negative.   Skin: Negative.   Allergic/Immunologic: Negative.   Neurological: Negative.   Hematological: Negative.   Psychiatric/Behavioral: Negative.    All other systems reviewed and are negative.     Objective:   Virtual encounter, vitals limited, only able to obtain the following There were no vitals filed for this visit. There is no height or weight on file to calculate BMI. Nursing Note and Vital Signs reviewed.  Physical Exam Vitals and nursing note reviewed.  Constitutional:      General: She is not in acute distress.    Appearance: She is obese. She is not ill-appearing, toxic-appearing or diaphoretic.  HENT:     Head: Normocephalic and atraumatic.     Right Ear: External ear normal.     Left Ear: External ear normal.     Nose: Nose normal.  Eyes:     Comments: Left eyebrown with piercing - two metal balls visible with mild swelling noted to eyebrown, between balls, and slight edema to upper left eyelid without erythema, no visible drainage, pt did palpate it during exam no ttp  Neurological:     Mental Status: She is alert.     PE limited by virtual encounter  No results found for this or any previous visit (from the past 72 hour(s)).  Assessment and Plan:     ICD-10-CM   1. Edema of left upper eyelid  H02.844 sulfamethoxazole-trimethoprim (BACTRIM DS) 800-160 MG tablet   suspected due to trauma/injury, no bacterial infection at this time, some swelling and drainage would be expected, wound care similar to w initial piercing    2. Piercing of left eyebrow  S01.132A    recent piercing, healed in the past  couple weeks - got pulled/caught on something - no erythema, warmth, purulence - close monitoring    3. Type 2 diabetes mellitus with hyperglycemia, without long-term current use of insulin ([facility])  E11.65 Hemoglobin 123456    BASIC METABOLIC PANEL WITH GFR   uncontrolled, last A1C 7.8, did not come for f/up q 3 months, due for labs, not checking sugars,she is concerned about infection with DM    Pt instructed to do warm compresses with clean gauze and antiseptic solution to piercing and monitor.  Some swelling, tenderness and drainage would be expected similar to how it was after newly pierced -  Pt specifically instructed to come into office for in person eval/exam if there is any worsening of swelling or any new redness or purulent discharge- would need careful monitoring and arrangement of f/up  Abx sent in  to cover skin infections - but at this time I do not think she needs it - if she notes any worsening and starts them she also understands that that would be something she needs to be seen in office for right away   -Red flags and when to present for emergency care or RTC including fever >101.33F, chest pain, shortness of breath, new/worsening/un-resolving symptoms, reviewed with patient at time of visit. Follow up and care instructions discussed and provided in AVS. - I discussed the assessment and treatment plan with the patient. The patient was provided an opportunity to ask questions and all were answered. The patient agreed with the plan and demonstrated an understanding of the instructions.  I provided 22+ minutes of non-face-to-face time during this encounter.  Delsa Grana, PA-C 08/27/22 10:04 AM

## 2022-11-09 ENCOUNTER — Other Ambulatory Visit: Payer: Self-pay | Admitting: Family Medicine

## 2022-12-07 ENCOUNTER — Telehealth (INDEPENDENT_AMBULATORY_CARE_PROVIDER_SITE_OTHER): Payer: BC Managed Care – PPO | Admitting: Family Medicine

## 2022-12-07 ENCOUNTER — Encounter: Payer: Self-pay | Admitting: Family Medicine

## 2022-12-07 VITALS — BP 130/80 | HR 99 | Temp 98.0°F | Resp 16 | Ht 66.0 in | Wt 231.3 lb

## 2022-12-07 DIAGNOSIS — K760 Fatty (change of) liver, not elsewhere classified: Secondary | ICD-10-CM

## 2022-12-07 DIAGNOSIS — E7800 Pure hypercholesterolemia, unspecified: Secondary | ICD-10-CM

## 2022-12-07 DIAGNOSIS — Z5181 Encounter for therapeutic drug level monitoring: Secondary | ICD-10-CM

## 2022-12-07 DIAGNOSIS — E1165 Type 2 diabetes mellitus with hyperglycemia: Secondary | ICD-10-CM

## 2022-12-07 DIAGNOSIS — Z7984 Long term (current) use of oral hypoglycemic drugs: Secondary | ICD-10-CM

## 2022-12-07 DIAGNOSIS — I10 Essential (primary) hypertension: Secondary | ICD-10-CM

## 2022-12-07 DIAGNOSIS — Z6837 Body mass index (BMI) 37.0-37.9, adult: Secondary | ICD-10-CM

## 2022-12-07 DIAGNOSIS — J302 Other seasonal allergic rhinitis: Secondary | ICD-10-CM

## 2022-12-07 MED ORDER — ROSUVASTATIN CALCIUM 5 MG PO TABS: 5.0000 mg | ORAL_TABLET | Freq: Every day | ORAL | 1 refills | Status: DC

## 2022-12-07 MED ORDER — LISINOPRIL 10 MG PO TABS: 10.0000 mg | ORAL_TABLET | Freq: Every day | ORAL | 1 refills | Status: DC

## 2022-12-07 MED ORDER — METFORMIN HCL 1000 MG PO TABS: 1000.0000 mg | ORAL_TABLET | Freq: Two times a day (BID) | ORAL | 1 refills | Status: DC

## 2022-12-07 NOTE — Assessment & Plan Note (Signed)
Pt notes some higher BP readings a few weeks ago after she started methylphenidate BP Readings from Last 3 Encounters:  12/07/22 130/80  05/21/22 (!) 144/91  04/19/22 (!) 150/106   Bp today is near goal, she is losing weight and working on diet/lifestyle efforts and taking lisinopril 10 mg daily with good compliance and no current med SE We will monitor BP with her new medications and plan to increase lisinopril dose to 20 mg if she has average readings >130/80

## 2022-12-07 NOTE — Assessment & Plan Note (Signed)
Decreased BMI from >40 to 37 Pt congratulated and encouraged to keep working on sustainable diet/lifestyle efforts  Wt Readings from Last 5 Encounters:  12/07/22 231 lb 4.8 oz (104.9 kg)  05/21/22 232 lb (105.2 kg)  04/19/22 242 lb 4.8 oz (109.9 kg)  04/10/22 245 lb (111.1 kg)  01/10/22 246 lb (111.6 kg)   BMI Readings from Last 5 Encounters:  12/07/22 37.33 kg/m  05/21/22 36.88 kg/m  04/19/22 39.11 kg/m  04/10/22 39.54 kg/m  01/10/22 39.71 kg/m

## 2022-12-07 NOTE — Assessment & Plan Note (Signed)
See change in bmi below with weight loss

## 2022-12-07 NOTE — Addendum Note (Signed)
Addended by: Forde Radon on: 12/07/2022 01:36 PM   Modules accepted: Orders

## 2022-12-07 NOTE — Progress Notes (Signed)
Name: Caitlin Gomez   MRN: [id number]    DOB: 07-28-1980   Date:12/07/2022       Progress Note  Subjective:    I connected with  Caitlin Gomez  on 12/07/22 at  9:40 AM EDT by a video enabled telemedicine application and verified that I am speaking with the correct person using two identifiers.  I discussed the limitations of evaluation and management by telemedicine and the availability of in person appointments. The patient expressed understanding and agreed to proceed. Staff also discussed with the patient that there may be a patient responsible charge related to this service. Patient Location: private vehicle Provider Location: private office Additional Individuals present: none  Chief Complaint  Patient presents with   Hypertension    BP readings at home been running high only systolic 130s-140s. Pt states this happened after starting methylphenidate 36 MG PO CR tablet.    [name] is a 42 y.o. female, presents for virtual visit for routine follow up on the conditions listed above.   Pt presents for f/up on DM and HTN She had med changes per psychiatry, started ADHD stimulant meds, noted some higher BP readings a few weeks ago 120-140's/70-80's She has lost 15 lbs recently She has hx of T2DM not well controlled currently on metformin 1000 mg BID HTN managed with lisinopril 10 mg daily She is prescribed statin crestor but has not taken it Lab Results  Component Value Date   HGBA1C 7.9 (H) 04/19/2022   BP Readings from Last 3 Encounters:  12/07/22 130/80  05/21/22 (!) 144/91  04/19/22 (!) 150/106   Lab Results  Component Value Date   CHOL 206 (H) 04/19/2022   HDL 40 (L) 04/19/2022   LDLCALC 141 (H) 04/19/2022   LDLDIRECT 129.0 12/09/2018   TRIG 127 04/19/2022   CHOLHDL 5.2 (H) 04/19/2022   She has been walking a lot, doing less emotional eating Wt Readings from Last 5 Encounters:  12/07/22 231 lb 4.8 oz (104.9 kg)  05/21/22 232 lb (105.2 kg)  04/19/22 242  lb 4.8 oz (109.9 kg)  04/10/22 245 lb (111.1 kg)  01/10/22 246 lb (111.6 kg)   BMI Readings from Last 5 Encounters:  12/07/22 37.33 kg/m  05/21/22 36.88 kg/m  04/19/22 39.11 kg/m  04/10/22 39.54 kg/m  01/10/22 39.71 kg/m     Patient Active Problem List   Diagnosis Date Noted   Polyp of sigmoid colon 05/21/2022   Type 2 diabetes mellitus with hyperglycemia, without long-term current use of insulin (HCC) 10/02/2021   Closed fracture of distal phalanx of finger 03/22/2021   Irregular menses 05/04/2019   Metabolic syndrome 05/04/2019   Chronic idiopathic urticaria 05/04/2019   Hypercholesterolemia 03/28/2018   Morbid obesity with BMI of 40.0-44.9, adult (HCC) 09/05/2017   History of diverticulitis 09/05/2017   Incidental lung nodule 02/18/2017   Allergic rhinitis 09/05/2016   Fatty liver 06/10/2016   Bipolar disorder (HCC) 03/05/2016   Essential hypertension 03/05/2016   Type 2 diabetes mellitus without complication, without long-term current use of insulin (HCC) 03/05/2016    Current Outpatient Medications:    albuterol (VENTOLIN HFA) 108 (90 Base) MCG/ACT inhaler, Inhale 2 puffs into the lungs every 6 (six) hours as needed for wheezing or shortness of breath., Disp: 18 g, Rfl: 1   EPINEPHrine (EPIPEN 2-PAK) 0.3 mg/0.3 mL IJ SOAJ injection, Inject 0.3 mg into the muscle as needed., Disp: , Rfl:    fexofenadine (ALLEGRA ALLERGY) 180 MG tablet, Take 180 mg  by mouth daily., Disp: , Rfl:    lamoTRIgine (LAMICTAL) 100 MG tablet, Take 250 mg by mouth daily. , Disp: , Rfl:    lisinopril (ZESTRIL) 10 MG tablet, TAKE 1 TABLET BY MOUTH ONCE DAILY, Disp: 90 tablet, Rfl: 0   metFORMIN (GLUCOPHAGE) 1000 MG tablet, Take 1 tablet (1,000 mg total) by mouth 2 (two) times daily with a meal., Disp: 180 tablet, Rfl: 3   methylphenidate 36 MG PO CR tablet, Take 36 mg by mouth every morning., Disp: , Rfl:    ONETOUCH VERIO test strip, USE AS DIRECTED TO CHECK BLOOD SUGAR ONCE DAILY., Disp: 50  each, Rfl: 6   propranolol (INDERAL) 20 MG tablet, Take 20 mg by mouth daily., Disp: , Rfl:    QUEtiapine (SEROQUEL) 50 MG tablet, Take 50 mg by mouth as needed. , Disp: , Rfl:    hydrOXYzine (ATARAX) 10 MG tablet, Take 10 mg by mouth every 4 (four) hours as needed. (Patient not taking: Reported on 12/07/2022), Disp: , Rfl:    metFORMIN (GLUCOPHAGE) 500 MG tablet, TAKE 2 TABLETS BY MOUTH TWICE A DAY WITHMEALS (Patient not taking: Reported on 12/07/2022), Disp: 360 tablet, Rfl: 0   rosuvastatin (CRESTOR) 5 MG tablet, Take 1 tablet (5 mg total) by mouth daily. (Patient not taking: Reported on 12/07/2022), Disp: 90 tablet, Rfl: 3 Allergies  Allergen Reactions   Amoxicillin Other (See Comments)    Chest pain    Clindamycin Swelling   Oysters [Shellfish Allergy]     Dizzy, nauseated   Ziprasidone Hcl Swelling   Amoxicillin-Pot Clavulanate Rash    Past Surgical History:  Procedure Laterality Date   COLONOSCOPY WITH PROPOFOL N/A 05/21/2022   Procedure: COLONOSCOPY WITH PROPOFOL;  Surgeon: Toney Reil, MD;  Location: [facility] ENDOSCOPY;  Service: Gastroenterology;  Laterality: N/A;   TONSILLECTOMY AND ADENOIDECTOMY     WISDOM TOOTH EXTRACTION     Family History  Problem Relation Age of Onset   Cancer Mother 57       pancreatic/bile duct   Alcohol abuse Mother    Hyperlipidemia Mother    Hypertension Mother    Mental illness Mother    Diabetes Mother    Diabetes Father    Breast cancer Paternal Aunt    Mental illness Paternal Aunt    Cancer Paternal Aunt        lung   Breast cancer Paternal Aunt 77   Early death Maternal Grandmother    Mental illness Maternal Grandmother    Diabetes Paternal Grandmother    Stroke Paternal Grandfather    Diabetes Paternal Grandfather    Social History   Socioeconomic History   Marital status: Married    Spouse name: Caitlin Gomez   Number of children: 1   Years of education: 12   Highest education level: Some college, no degree  Occupational History    Occupation: Clinical research associate, Counselling psychologist  Tobacco Use   Smoking status: Former    Types: Cigarettes   Smokeless tobacco: Never  Building services engineer Use: Every day  Substance and Sexual Activity   Alcohol use: Not Currently   Drug use: No    Comment: delta 8   Sexual activity: Yes    Birth control/protection: Condom  Other Topics Concern   Not on file  Social History Narrative   Born in [place] and has  Lived here since 42 years old.       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       Reads a  lot.       Married.      51 year old daughter.       Social Determinants of Health   Financial Resource Strain: Low Risk  (10/02/2021)   Overall Financial Resource Strain (CARDIA)    Difficulty of Paying Living Expenses: Not hard at all  Food Insecurity: No Food Insecurity (10/02/2021)   Hunger Vital Sign    Worried About Running Out of Food in the Last Year: Never true    Ran Out of Food in the Last Year: Never true  Transportation Needs: No Transportation Needs (10/02/2021)   PRAPARE - Administrator, Civil Service (Medical): No    Lack of Transportation (Non-Medical): No  Physical Activity: Inactive (10/02/2021)   Exercise Vital Sign    Days of Exercise per Week: 0 days    Minutes of Exercise per Session: 0 min  Stress: Stress Concern Present (10/02/2021)   [employer] of Occupational Health - Occupational Stress Questionnaire    Feeling of Stress : Very much  Social Connections: Moderately Integrated (10/02/2021)   Social Connection and Isolation Panel [NHANES]    Frequency of Communication with Friends and Family: More than three times a week    Frequency of Social Gatherings with Friends and Family: Once a week    Attends Religious Services: Never    Database administrator or Organizations: Yes    Attends Engineer, structural: More than 4 times per year    Marital Status: Married  Catering manager Violence: Not At Risk (10/02/2021)   Humiliation, Afraid, Rape, and Kick  questionnaire    Fear of Current or Ex-Partner: No    Emotionally Abused: No    Physically Abused: No    Sexually Abused: No    Chart Review Today: I personally reviewed active problem list, medication list, allergies, family history, social history, health maintenance, notes from last encounter, lab results, imaging with the patient/caregiver today.   Review of Systems  Constitutional: Negative.   HENT: Negative.    Eyes: Negative.   Respiratory: Negative.    Cardiovascular: Negative.   Gastrointestinal: Negative.   Endocrine: Negative.   Genitourinary: Negative.   Musculoskeletal: Negative.   Skin: Negative.   Allergic/Immunologic: Negative.   Neurological: Negative.   Hematological: Negative.   Psychiatric/Behavioral: Negative.    All other systems reviewed and are negative.     Objective:    Virtual encounter, vitals limited, only able to obtain the following Today's Vitals   12/07/22 1020  BP: 130/80  Pulse: 99  Resp: 16  Temp: 98 F (36.7 C)  TempSrc: Oral  SpO2: 98%  Weight: 231 lb 4.8 oz (104.9 kg)  Height: 5\' 6"  (1.676 m)   Body mass index is 37.33 kg/m. Nursing Note and Vital Signs reviewed.  Physical Exam Vitals and nursing note reviewed.  Constitutional:      General: She is not in acute distress.    Appearance: Normal appearance. She is normal weight. She is not ill-appearing, toxic-appearing or diaphoretic.  Pulmonary:     Effort: No respiratory distress.  Neurological:     Mental Status: She is alert. Mental status is at baseline.  Psychiatric:        Mood and Affect: Mood normal.        Behavior: Behavior normal.     PE limited by telephone encounter  No results found for this or any previous visit (from the past 72 hour(s)).  PHQ2/9:    12/07/2022  9:08 AM 08/27/2022    9:22 AM 04/19/2022    1:56 PM 01/01/2022   11:48 AM 11/10/2021   11:17 AM  Depression screen PHQ 2/9  Decreased Interest 0 0 2 0 2  Down, Depressed, Hopeless  0 0 2 0 2  PHQ - 2 Score 0 0 4 0 4  Altered sleeping 0 0 2  0  Tired, decreased energy 0 0 2  0  Change in appetite 0 0 0  0  Feeling bad or failure about yourself  0 0 0  0  Trouble concentrating 0 0 0  0  Moving slowly or fidgety/restless 0 0 0  0  Suicidal thoughts 0 0 0  0  PHQ-9 Score 0 0 8  4  Difficult doing work/chores Not difficult at all Not difficult at all Somewhat difficult  Somewhat difficult   PHQ-2/9 Result is reviewed today and is negative  Fall Risk:    12/07/2022    9:07 AM 08/27/2022    9:22 AM 04/19/2022    1:56 PM 01/01/2022   11:47 AM 11/10/2021   11:17 AM  Fall Risk   Falls in the past year? 0 1 0 0 0  Number falls in past yr: 0 1 0 0 0  Injury with Fall? 0 0 0 0 0  Risk for fall due to : No Fall Risks Impaired balance/gait No Fall Risks  No Fall Risks  Follow up Falls prevention discussed;Education provided;Falls evaluation completed Falls prevention discussed;Education provided;Falls evaluation completed Falls prevention discussed;Education provided;Falls evaluation completed Falls evaluation completed Falls prevention discussed;Education provided     Assessment and Plan:   Problem List Items Addressed This Visit       Cardiovascular and Mediastinum   Essential hypertension    Pt notes some higher BP readings a few weeks ago after she started methylphenidate BP Readings from Last 3 Encounters:  12/07/22 130/80  05/21/22 (!) 144/91  04/19/22 (!) 150/106  Bp today is near goal, she is losing weight and working on diet/lifestyle efforts and taking lisinopril 10 mg daily with good compliance and no current med SE We will monitor BP with her new medications and plan to increase lisinopril dose to 20 mg if she has average readings >130/80      Relevant Medications   lisinopril (ZESTRIL) 10 MG tablet   rosuvastatin (CRESTOR) 5 MG tablet   Other Relevant Orders   COMPLETE METABOLIC PANEL WITH GFR     Respiratory   Allergic rhinitis    Sx stable and  well controlled with OTC meds She previously needed Rx meds which have not been approved by insurance in a few years - I have enouraged her to consider ENT f/up if sx are not well controlled        Digestive   Fatty liver    She has been prescribed crestor but not taking it and she has been working on diet/lifestyle efforts, she's lost some weight, working on getting T2DM controlled      Relevant Orders   Lipid panel     Endocrine   Type 2 diabetes mellitus with hyperglycemia, without long-term current use of insulin (HCC) - Primary    Has been uncontrolled Pt managing DM with metformin 1000 mg BID and diet and lifestyle efforts Reports good med compliance Pt has no SE from meds. Blood sugars 120-140s fasting CBGs (higher readings depending on what she eats) Denies: Polyuria, polydipsia, vision changes, neuropathy, hypoglycemia Recent pertinent labs: Lab Results  Component Value Date   HGBA1C 7.9 (H) 04/19/2022   HGBA1C 8.7 (H) 10/02/2021   HGBA1C 6.4 (A) 02/23/2021    Standard of care and health maintenance: ACEI/ARB:  lisinopril 10 Statin:  prescribed but not taking - again encouraged her to consider taking - the indications and benefits of statins were reviewed at length today        Relevant Medications   lisinopril (ZESTRIL) 10 MG tablet   metFORMIN (GLUCOPHAGE) 1000 MG tablet   rosuvastatin (CRESTOR) 5 MG tablet   Other Relevant Orders   COMPLETE METABOLIC PANEL WITH GFR   CBC with Differential/Platelet   Hemoglobin A1c   Lipid panel     Other   Morbid obesity with BMI of 40.0-44.9, adult (HCC)    See change in bmi below with weight loss      Relevant Medications   methylphenidate 36 MG PO CR tablet   metFORMIN (GLUCOPHAGE) 1000 MG tablet   Other Relevant Orders   COMPLETE METABOLIC PANEL WITH GFR   CBC with Differential/Platelet   Hemoglobin A1c   Lipid panel   Hypercholesterolemia    High lipids (likely some psych meds also exacerbating this) Not  taking the crestor which has been prescribed for her With T2DM statin is indicated, also she has fatty liver which would may improve with statin/improved lipids - we have been monitoring LFTs as well Lab Results  Component Value Date   CHOL 206 (H) 04/19/2022   HDL 40 (L) 04/19/2022   LDLCALC 141 (H) 04/19/2022   LDLDIRECT 129.0 12/09/2018   TRIG 127 04/19/2022   CHOLHDL 5.2 (H) 04/19/2022  Statin indicated with dx of T2DM  The 10-year ASCVD risk score (Arnett DK, et al., 2019) is: 3.1%   Values used to calculate the score:     Age: 75 years     Sex: Female     Is Non-Hispanic African American: No     Diabetic: Yes     Tobacco smoker: No     Systolic Blood Pressure: 130 mmHg     Is BP treated: Yes     HDL Cholesterol: 40 mg/dL     Total Cholesterol: 206 mg/dL She would like to check her cholesterol today and see how diet/lifestyle and weight loss has improved lipids  She agrees to start statin and I explained we will need to recheck labs again 3-4 months after starting statin      Relevant Medications   lisinopril (ZESTRIL) 10 MG tablet   rosuvastatin (CRESTOR) 5 MG tablet   Other Relevant Orders   COMPLETE METABOLIC PANEL WITH GFR   Lipid panel   Class 2 severe obesity with serious comorbidity and body mass index (BMI) of 37.0 to 37.9 in adult (HCC)    Decreased BMI from >40 to 37 Pt congratulated and encouraged to keep working on sustainable diet/lifestyle efforts  Wt Readings from Last 5 Encounters:  12/07/22 231 lb 4.8 oz (104.9 kg)  05/21/22 232 lb (105.2 kg)  04/19/22 242 lb 4.8 oz (109.9 kg)  04/10/22 245 lb (111.1 kg)  01/10/22 246 lb (111.6 kg)   BMI Readings from Last 5 Encounters:  12/07/22 37.33 kg/m  05/21/22 36.88 kg/m  04/19/22 39.11 kg/m  04/10/22 39.54 kg/m  01/10/22 39.71 kg/m        Relevant Medications   methylphenidate 36 MG PO CR tablet   metFORMIN (GLUCOPHAGE) 1000 MG tablet   Other Visit Diagnoses     Encounter for medication  monitoring  Relevant Orders   COMPLETE METABOLIC PANEL WITH GFR   CBC with Differential/Platelet   Hemoglobin A1c   Lipid panel        I discussed the assessment and treatment plan with the patient. The patient was provided an opportunity to ask questions and all were answered. The patient agreed with the plan and demonstrated an understanding of the instructions.  The patient was advised to call back or seek an in-person evaluation if the symptoms worsen or if the condition fails to improve as anticipated.  I provided 30 minutes of non-face-to-face time during this encounter.  Danelle Berry, PA-C 12/07/22 12:13 PM

## 2022-12-07 NOTE — Assessment & Plan Note (Signed)
Has been uncontrolled Pt managing DM with metformin 1000 mg BID and diet and lifestyle efforts Reports good med compliance Pt has no SE from meds. Blood sugars 120-140s fasting CBGs (higher readings depending on what she eats) Denies: Polyuria, polydipsia, vision changes, neuropathy, hypoglycemia Recent pertinent labs: Lab Results  Component Value Date   HGBA1C 7.9 (H) 04/19/2022   HGBA1C 8.7 (H) 10/02/2021   HGBA1C 6.4 (A) 02/23/2021    Standard of care and health maintenance: ACEI/ARB:  lisinopril 10 Statin:  prescribed but not taking - again encouraged her to consider taking - the indications and benefits of statins were reviewed at length today

## 2022-12-07 NOTE — Assessment & Plan Note (Signed)
Sx stable and well controlled with OTC meds She previously needed Rx meds which have not been approved by insurance in a few years - I have enouraged her to consider ENT f/up if sx are not well controlled

## 2022-12-07 NOTE — Assessment & Plan Note (Signed)
High lipids (likely some psych meds also exacerbating this) Not taking the crestor which has been prescribed for her With T2DM statin is indicated, also she has fatty liver which would may improve with statin/improved lipids - we have been monitoring LFTs as well Lab Results  Component Value Date   CHOL 206 (H) 04/19/2022   HDL 40 (L) 04/19/2022   LDLCALC 141 (H) 04/19/2022   LDLDIRECT 129.0 12/09/2018   TRIG 127 04/19/2022   CHOLHDL 5.2 (H) 04/19/2022  Statin indicated with dx of T2DM  The 10-year ASCVD risk score (Arnett DK, et al., 2019) is: 3.1%   Values used to calculate the score:     Age: 42 years     Sex: Female     Is Non-Hispanic African American: No     Diabetic: Yes     Tobacco smoker: No     Systolic Blood Pressure: 130 mmHg     Is BP treated: Yes     HDL Cholesterol: 40 mg/dL     Total Cholesterol: 206 mg/dL She would like to check her cholesterol today and see how diet/lifestyle and weight loss has improved lipids  She agrees to start statin and I explained we will need to recheck labs again 3-4 months after starting statin

## 2022-12-07 NOTE — Assessment & Plan Note (Signed)
She has been prescribed crestor but not taking it and she has been working on diet/lifestyle efforts, she's lost some weight, working on getting T2DM controlled

## 2022-12-08 LAB — CBC WITH DIFFERENTIAL/PLATELET
Absolute Monocytes: 598 cells/uL (ref 200–950)
Basophils Absolute: 79 cells/uL (ref 0–200)
Basophils Relative: 1.1 %
Eosinophils Absolute: 230 cells/uL (ref 15–500)
Eosinophils Relative: 3.2 %
HCT: 40.1 % (ref 35.0–45.0)
Hemoglobin: 13.4 g/dL (ref 11.7–15.5)
Lymphs Abs: 2102 cells/uL (ref 850–3900)
MCH: 28.7 pg (ref 27.0–33.0)
MCHC: 33.4 g/dL (ref 32.0–36.0)
MCV: 85.9 fL (ref 80.0–100.0)
MPV: 9.6 fL (ref 7.5–12.5)
Monocytes Relative: 8.3 %
Neutro Abs: 4190 cells/uL (ref 1500–7800)
Neutrophils Relative %: 58.2 %
Platelets: 328 10*3/uL (ref 140–400)
RBC: 4.67 10*6/uL (ref 3.80–5.10)
RDW: 12.9 % (ref 11.0–15.0)
Total Lymphocyte: 29.2 %
WBC: 7.2 10*3/uL (ref 3.8–10.8)

## 2022-12-08 LAB — COMPLETE METABOLIC PANEL WITH GFR
AG Ratio: 1.6 (calc) (ref 1.0–2.5)
ALT: 25 U/L (ref 6–29)
AST: 20 U/L (ref 10–30)
Albumin: 4.6 g/dL (ref 3.6–5.1)
Alkaline phosphatase (APISO): 51 U/L (ref 31–125)
BUN: 12 mg/dL (ref 7–25)
CO2: 24 mmol/L (ref 20–32)
Calcium: 9.6 mg/dL (ref 8.6–10.2)
Chloride: 106 mmol/L (ref 98–110)
Creat: 0.69 mg/dL (ref 0.50–0.99)
Globulin: 2.8 g/dL (calc) (ref 1.9–3.7)
Glucose, Bld: 172 mg/dL — ABNORMAL HIGH (ref 65–99)
Potassium: 4.8 mmol/L (ref 3.5–5.3)
Sodium: 139 mmol/L (ref 135–146)
Total Bilirubin: 0.2 mg/dL (ref 0.2–1.2)
Total Protein: 7.4 g/dL (ref 6.1–8.1)
eGFR: 112 mL/min/{1.73_m2} (ref >=60)

## 2022-12-08 LAB — TEST AUTHORIZATION

## 2022-12-08 LAB — LIPID PANEL
Cholesterol: 189 mg/dL (ref <200)
HDL: 38 mg/dL — ABNORMAL LOW (ref >=50)
LDL Cholesterol (Calc): 117 mg/dL (calc) — ABNORMAL HIGH
Non-HDL Cholesterol (Calc): 151 mg/dL (calc) — ABNORMAL HIGH (ref <130)
Total CHOL/HDL Ratio: 5 (calc) — ABNORMAL HIGH (ref <5.0)
Triglycerides: 226 mg/dL — ABNORMAL HIGH (ref <150)

## 2022-12-08 LAB — HEMOGLOBIN A1C
Hgb A1c MFr Bld: 7.3 % of total Hgb — ABNORMAL HIGH (ref <5.7)
Mean Plasma Glucose: 163 mg/dL
eAG (mmol/L): 9 mmol/L

## 2022-12-12 ENCOUNTER — Encounter: Payer: Self-pay | Admitting: Family Medicine

## 2023-01-16 ENCOUNTER — Ambulatory Visit (INDEPENDENT_AMBULATORY_CARE_PROVIDER_SITE_OTHER): Payer: BC Managed Care – PPO | Admitting: Internal Medicine

## 2023-01-16 ENCOUNTER — Encounter: Payer: Self-pay | Admitting: Internal Medicine

## 2023-01-16 VITALS — BP 132/86 | HR 90 | Temp 98.0°F | Resp 16 | Ht 66.0 in | Wt 226.0 lb

## 2023-01-16 DIAGNOSIS — J029 Acute pharyngitis, unspecified: Secondary | ICD-10-CM | POA: Diagnosis not present

## 2023-01-16 DIAGNOSIS — J069 Acute upper respiratory infection, unspecified: Secondary | ICD-10-CM | POA: Diagnosis not present

## 2023-01-16 DIAGNOSIS — R051 Acute cough: Secondary | ICD-10-CM

## 2023-01-16 MED ORDER — BENZONATATE 100 MG PO CAPS: 100.0000 mg | ORAL_CAPSULE | Freq: Two times a day (BID) | ORAL | 0 refills | Status: DC | PRN

## 2023-01-16 NOTE — Patient Instructions (Addendum)
It was great seeing you today!  Plan discussed at today's visit: -COVID and strep tests today -Symptoms consistent with viral illness, need to rest and hydrate -Will send in cough medication, also recommend decongestant like Allegra D, Flonase nasal spray, Zofran as needed for nausea -If symptoms worsen or fail to improve please let us know  Follow up in: as needed  Take care and let us know if you have any questions or concerns prior to your next visit.  Dr. Caralee Ates

## 2023-01-16 NOTE — Progress Notes (Signed)
Acute Office Visit  Subjective:     Patient ID: Caitlin Gomez, female    DOB: [birth year], 42 y.o.   MRN: [id number]  Chief Complaint  Patient presents with   URI    Sore throat, ear pain, diarrhea, headache, teeth pain, chest pain     HPI Patient is in today for sore throat, ear pain and diarrhea.  Symptoms started about 5 days ago.  URI Compliant:  -Fever:  had chills, didn't check temperature  -Cough: yes dry -Shortness of breath: yes with activity  -Wheezing: no -Chest tightness: yes -Chest congestion: yes -Nasal congestion: no -Runny nose: yes, clear  -Post nasal drip: no -Sore throat: yes -Swollen glands: yes -Sinus pressure: yes -Ear pain: yes left -Ear pressure: yes left -Vomiting: no -Diarrhea: yes  -Fatigue: yes -Sick contacts: yes, daughter had similar symptoms, just started back at school -Context: fluctuating  -Treatments attempted: Ibuprofen     Review of Systems  Constitutional:  Positive for chills and malaise/fatigue. Negative for fever.  HENT:  Positive for ear pain and sore throat. Negative for congestion and sinus pain.   Respiratory:  Positive for cough and shortness of breath. Negative for sputum production and wheezing.   Cardiovascular:  Negative for chest pain.  Gastrointestinal:  Positive for diarrhea and nausea. Negative for abdominal pain and vomiting.  Neurological:  Negative for headaches.        Objective:    BP 132/86   Pulse 90   Temp 98 F (36.7 C)   Resp 16   Ht 5\' 6"  (1.676 m)   Wt 226 lb (102.5 kg)   SpO2 98%   BMI 36.48 kg/m  BP Readings from Last 3 Encounters:  01/16/23 132/86  12/07/22 130/80  05/21/22 (!) 144/91   Wt Readings from Last 3 Encounters:  01/16/23 226 lb (102.5 kg)  12/07/22 231 lb 4.8 oz (104.9 kg)  05/21/22 232 lb (105.2 kg)      Physical Exam Constitutional:      Appearance: Normal appearance. She is ill-appearing.  HENT:     Head: Normocephalic and atraumatic.     Comments: No  sinus pain on exam to palpation     Right Ear: Tympanic membrane, ear canal and external ear normal.     Left Ear: Tympanic membrane, ear canal and external ear normal.     Nose: Nose normal.     Mouth/Throat:     Mouth: Mucous membranes are moist.     Comments: Mild erythema present  Eyes:     Extraocular Movements: Extraocular movements intact.     Conjunctiva/sclera: Conjunctivae normal.     Pupils: Pupils are equal, round, and reactive to light.  Cardiovascular:     Rate and Rhythm: Normal rate and regular rhythm.  Pulmonary:     Effort: Pulmonary effort is normal.     Breath sounds: Normal breath sounds. No wheezing, rhonchi or rales.  Lymphadenopathy:     Cervical: Cervical adenopathy present.  Skin:    General: Skin is warm and dry.  Neurological:     General: No focal deficit present.     Mental Status: She is alert. Mental status is at baseline.  Psychiatric:        Mood and Affect: Mood normal.        Behavior: Behavior normal.     No results found for any visits on 01/16/23.      Assessment & Plan:   1. Viral upper respiratory tract infection/Acute cough/Sore  throat: Symptoms consistent with viral infection, the patient's daughter recently had similar symptoms after starting school.  Patient did have a home negative COVID test.  Will retest again today.  Rapid strep negative will send for throat culture to be sure.  Recommend rest and hydration.  Discussed using low-dose steroid pack to help with congestion and sinus symptoms, patient is a diabetic and does not want to increase her sugars.  Will use an over-the-counter decongestant instead.  Will prescribe cough suppressant.  Also recommend nasal steroids, Zofran and needed.  Patient will call if symptoms worsen or fail to improve.  - Novel Coronavirus, NAA ([facility]) - benzonatate (TESSALON) 100 MG capsule; Take 1 capsule (100 mg total) by mouth 2 (two) times daily as needed for cough.  Dispense: 20 capsule; Refill:  0 - Culture, Group A Strep   Return if symptoms worsen or fail to improve.  Margarita Mail, DO

## 2023-01-18 ENCOUNTER — Ambulatory Visit: Payer: BC Managed Care – PPO | Admitting: Family Medicine

## 2023-01-18 LAB — CULTURE, GROUP A STREP
MICRO NUMBER:: 15331731
SPECIMEN QUALITY:: ADEQUATE

## 2023-01-18 LAB — NOVEL CORONAVIRUS, NAA: SARS-CoV-2, NAA: DETECTED — AB

## 2023-09-13 ENCOUNTER — Other Ambulatory Visit: Payer: Self-pay | Admitting: Family Medicine

## 2023-09-13 DIAGNOSIS — E1165 Type 2 diabetes mellitus with hyperglycemia: Secondary | ICD-10-CM

## 2023-09-13 DIAGNOSIS — I10 Essential (primary) hypertension: Secondary | ICD-10-CM

## 2023-09-16 ENCOUNTER — Other Ambulatory Visit: Payer: Self-pay | Admitting: Family Medicine

## 2023-09-16 DIAGNOSIS — E1165 Type 2 diabetes mellitus with hyperglycemia: Secondary | ICD-10-CM

## 2023-09-16 NOTE — Telephone Encounter (Signed)
 OV needed for additional refills.  Requested Prescriptions  Pending Prescriptions Disp Refills   lisinopril (ZESTRIL) 10 MG tablet [Pharmacy Med Name: LISINOPRIL 10MG  TABLET] 30 tablet 0    Sig: TAKE ONE TABLET (10 MG TOTAL) BY MOUTH DAILY.     Cardiovascular:  ACE Inhibitors Failed - 09/16/2023  9:35 AM      Failed - Cr in normal range and within 180 days    Creat  Date Value Ref Range Status  12/07/2022 0.69 0.50 - 0.99 mg/dL Final   Creatinine, Urine  Date Value Ref Range Status  04/19/2022 133 20 - 275 mg/dL Final         Failed - K in normal range and within 180 days    Potassium  Date Value Ref Range Status  12/07/2022 4.8 3.5 - 5.3 mmol/L Final  11/21/2012 3.5 3.5 - 5.1 mmol/L Final         Failed - Valid encounter within last 6 months    Recent Outpatient Visits   None            Passed - Patient is not pregnant      Passed - Last BP in normal range    BP Readings from Last 1 Encounters:  01/16/23 132/86

## 2023-09-17 NOTE — Telephone Encounter (Signed)
 Requested medication (s) are due for refill today: yes  Requested medication (s) are on the active medication list: yes  Last refill:  12/07/22  Future visit scheduled: no  Notes to clinic:  Unable to refill per protocol due to failed labs, no updated  A1c results.      Requested Prescriptions  Pending Prescriptions Disp Refills   metFORMIN (GLUCOPHAGE) 1000 MG tablet [Pharmacy Med Name: METFORMIN HYDROCHLORIDE 1000MG  TABLET] 180 tablet 1    Sig: TAKE ONE TABLET (1,000 MG TOTAL) BY MOUTH TWO TIMES DAILY WITH A MEAL.     Endocrinology:  Diabetes - Biguanides Failed - 09/17/2023 12:14 PM      Failed - HBA1C is between 0 and 7.9 and within 180 days    Hgb A1c MFr Bld  Date Value Ref Range Status  12/07/2022 7.3 (H) <5.7 % of total Hgb Final    Comment:    For someone without known diabetes, a hemoglobin A1c value of 6.5% or greater indicates that they may have  diabetes and this should be confirmed with a follow-up  test. . For someone with known diabetes, a value <7% indicates  that their diabetes is well controlled and a value  greater than or equal to 7% indicates suboptimal  control. A1c targets should be individualized based on  duration of diabetes, age, comorbid conditions, and  other considerations. . Currently, no consensus exists regarding use of hemoglobin A1c for diagnosis of diabetes for children. .          Failed - B12 Level in normal range and within 720 days    No results found for: "VITAMINB12"       Failed - Valid encounter within last 6 months    Recent Outpatient Visits   None            Passed - Cr in normal range and within 360 days    Creat  Date Value Ref Range Status  12/07/2022 0.69 0.50 - 0.99 mg/dL Final   Creatinine, Urine  Date Value Ref Range Status  04/19/2022 133 20 - 275 mg/dL Final         Passed - eGFR in normal range and within 360 days    GFR, Est African American  Date Value Ref Range Status  07/18/2020 130 > OR = 60  mL/min/1.66m2 Final   GFR, Est Non African American  Date Value Ref Range Status  07/18/2020 112 > OR = 60 mL/min/1.6m2 Final   GFR  Date Value Ref Range Status  12/18/2018 102.95 >60.00 mL/min Final   eGFR  Date Value Ref Range Status  12/07/2022 112 > OR = 60 mL/min/1.103m2 Final         Passed - CBC within normal limits and completed in the last 12 months    WBC  Date Value Ref Range Status  12/07/2022 7.2 3.8 - 10.8 Thousand/uL Final   RBC  Date Value Ref Range Status  12/07/2022 4.67 3.80 - 5.10 Million/uL Final   Hemoglobin  Date Value Ref Range Status  12/07/2022 13.4 11.7 - 15.5 g/dL Final   HGB  Date Value Ref Range Status  11/21/2012 14.5 12.0 - 16.0 g/dL Final   HCT  Date Value Ref Range Status  12/07/2022 40.1 35.0 - 45.0 % Final  11/21/2012 40.9 35.0 - 47.0 % Final   MCHC  Date Value Ref Range Status  12/07/2022 33.4 32.0 - 36.0 g/dL Final   [facility]  Date Value Ref Range Status  12/07/2022  28.7 27.0 - 33.0 pg Final   MCV  Date Value Ref Range Status  12/07/2022 85.9 80.0 - 100.0 fL Final  11/21/2012 84 80 - 100 fL Final   No results found for: "PLTCOUNTKUC", "LABPLAT", "POCPLA" RDW  Date Value Ref Range Status  12/07/2022 12.9 11.0 - 15.0 % Final  11/21/2012 12.9 11.5 - 14.5 % Final

## 2023-10-12 ENCOUNTER — Other Ambulatory Visit: Payer: Self-pay | Admitting: Family Medicine

## 2023-10-12 DIAGNOSIS — E1165 Type 2 diabetes mellitus with hyperglycemia: Secondary | ICD-10-CM

## 2023-10-12 DIAGNOSIS — I10 Essential (primary) hypertension: Secondary | ICD-10-CM

## 2023-10-15 NOTE — Telephone Encounter (Signed)
 Requested medication (s) are due for refill today: Yes  Requested medication (s) are on the active medication list: Yes  Last refill:  09/16/23  Future visit scheduled: No  Notes to clinic:  Left a message to make appointment.    Requested Prescriptions  Pending Prescriptions Disp Refills   lisinopril  (ZESTRIL ) 10 MG tablet [Pharmacy Med Name: LISINOPRIL  10MG  TABLET] 30 tablet 0    Sig: TAKE ONE TABLET (10 MG TOTAL) BY MOUTH DAILY.     Cardiovascular:  ACE Inhibitors Failed - 10/15/2023 10:18 AM      Failed - Cr in normal range and within 180 days    Creat  Date Value Ref Range Status  12/07/2022 0.69 0.50 - 0.99 mg/dL Final   Creatinine, Urine  Date Value Ref Range Status  04/19/2022 133 20 - 275 mg/dL Final         Failed - K in normal range and within 180 days    Potassium  Date Value Ref Range Status  12/07/2022 4.8 3.5 - 5.3 mmol/L Final  11/21/2012 3.5 3.5 - 5.1 mmol/L Final         Failed - Valid encounter within last 6 months    Recent Outpatient Visits   None            Passed - Patient is not pregnant      Passed - Last BP in normal range    BP Readings from Last 1 Encounters:  01/16/23 132/86

## 2024-01-06 ENCOUNTER — Ambulatory Visit: Admitting: Nurse Practitioner

## 2024-01-06 ENCOUNTER — Encounter: Payer: Self-pay | Admitting: Nurse Practitioner

## 2024-01-06 VITALS — BP 138/84 | HR 90 | Resp 16 | Ht 66.0 in | Wt 236.9 lb

## 2024-01-06 DIAGNOSIS — I10 Essential (primary) hypertension: Secondary | ICD-10-CM

## 2024-01-06 DIAGNOSIS — Z6837 Body mass index (BMI) 37.0-37.9, adult: Secondary | ICD-10-CM

## 2024-01-06 DIAGNOSIS — F319 Bipolar disorder, unspecified: Secondary | ICD-10-CM

## 2024-01-06 DIAGNOSIS — Z7984 Long term (current) use of oral hypoglycemic drugs: Secondary | ICD-10-CM

## 2024-01-06 DIAGNOSIS — Z23 Encounter for immunization: Secondary | ICD-10-CM

## 2024-01-06 DIAGNOSIS — E7800 Pure hypercholesterolemia, unspecified: Secondary | ICD-10-CM

## 2024-01-06 DIAGNOSIS — E1165 Type 2 diabetes mellitus with hyperglycemia: Secondary | ICD-10-CM

## 2024-01-06 DIAGNOSIS — E66812 Obesity, class 2: Secondary | ICD-10-CM

## 2024-01-06 DIAGNOSIS — J302 Other seasonal allergic rhinitis: Secondary | ICD-10-CM | POA: Diagnosis not present

## 2024-01-06 MED ORDER — METFORMIN HCL 1000 MG PO TABS: 1000.0000 mg | ORAL_TABLET | Freq: Two times a day (BID) | ORAL | 1 refills | Status: DC

## 2024-01-06 MED ORDER — LISINOPRIL 10 MG PO TABS: 10.0000 mg | ORAL_TABLET | Freq: Every day | ORAL | 1 refills | Status: DC

## 2024-01-06 NOTE — Progress Notes (Signed)
 BP 138/84   Pulse 90   Resp 16   Ht 5' 6 (1.676 m)   Wt 236 lb 14.4 oz (107.5 kg)   SpO2 98%   BMI 38.24 kg/m    Subjective:    Patient ID: Caitlin Gomez, female    DOB: 06-14-1980, 44 y.o.   MRN: [id number]  HPI: Caitlin Gomez is a 43 y.o. female  Chief Complaint  Patient presents with   Medical Management of Chronic Issues    Been out of medications    Discussed the use of AI scribe software for clinical note transcription with the patient, who gave verbal consent to proceed.  History of Present Illness Caitlin Gomez is a 43 year old female who presents for medication refill.  Diabetes mellitus management - Has not had laboratory testing in the past year - Not checking blood glucose regularly at home - When checked, blood glucose was 'fine' and 'nothing crazy' - Last hemoglobin A1c was 7.3% on December 07, 2022 - Concerned about toe injury due to diabetes  Medication adherence and refill - Out of current medications - Current medications: albuterol  inhaler as needed, lamotrigine 250 mg daily, lisinopril  10 mg daily, metformin  1000 mg twice daily, propranolol 20 mg daily, methylphenidate 36 mg daily - No longer taking Allegra, quetiapine, or rosuvastatin   Dyslipidemia - Last lipid panel showed triglycerides 226 mg/dL and LDL 882 mg/dL  Right toe pain - History of toe injury at a pool four years ago - Recent onset of pain at the site of the old scar, worsened with flexion - No pain in the past few days - Pain appears to be improving    Waist Measurement : 45.5 inches      01/06/2024    9:53 AM 12/07/2022    9:08 AM 08/27/2022    9:22 AM  Depression screen PHQ 2/9  Decreased Interest 1 0 0  Down, Depressed, Hopeless 1 0 0  PHQ - 2 Score 2 0 0  Altered sleeping 1 0 0  Tired, decreased energy 1 0 0  Change in appetite 1 0 0  Feeling bad or failure about yourself  1 0 0  Trouble concentrating 1 0 0  Moving slowly or fidgety/restless 0 0 0  Suicidal  thoughts 0 0 0  PHQ-9 Score 7 0 0  Difficult doing work/chores Very difficult Not difficult at all Not difficult at all       01/06/2024    9:54 AM 04/19/2022    2:03 PM 10/02/2021    2:16 PM 11/15/2020    8:18 AM  GAD 7 : Generalized Anxiety Score  Nervous, Anxious, on Edge 2 3 2 1   Control/stop worrying 2 3 1  0  Worry too much - different things 2 3 2 1   Trouble relaxing 2 1 2  0  Restless 2 1 1  0  Easily annoyed or irritable 1 0 2 0  Afraid - awful might happen 2 0 1 0  Total GAD 7 Score 13 11 11 2   Anxiety Difficulty Very difficult Somewhat difficult Somewhat difficult Not difficult at all     Relevant past medical, surgical, family and social history reviewed and updated as indicated. Interim medical history since our last visit reviewed. Allergies and medications reviewed and updated.  Review of Systems  Constitutional: Negative for fever or weight change.  Respiratory: Negative for cough and shortness of breath.   Cardiovascular: Negative for chest pain or palpitations.  Gastrointestinal: Negative for abdominal  pain, no bowel changes.  Musculoskeletal: Negative for gait problem or joint swelling.  Skin: Negative for rash.  Neurological: Negative for dizziness or headache.  No other specific complaints in a complete review of systems (except as listed in HPI above).      Objective:     BP 138/84   Pulse 90   Resp 16   Ht 5' 6 (1.676 m)   Wt 236 lb 14.4 oz (107.5 kg)   SpO2 98%   BMI 38.24 kg/m    Wt Readings from Last 3 Encounters:  01/06/24 236 lb 14.4 oz (107.5 kg)  01/16/23 226 lb (102.5 kg)  12/07/22 231 lb 4.8 oz (104.9 kg)    Physical Exam Physical Exam VITALS: BP- 132/86 MEASUREMENTS: Weight- 236, BMI- 38.24. GENERAL: Alert, cooperative, well developed, no acute distress. HEENT: Normocephalic, normal oropharynx, moist mucous membranes. CHEST: Clear to auscultation bilaterally, no wheezes, rhonchi, or crackles. CARDIOVASCULAR: Normal heart rate and  rhythm, S1 and S2 normal without murmurs. ABDOMEN: Soft, non-tender, non-distended, without organomegaly, normal bowel sounds. EXTREMITIES: No cyanosis or edema. NEUROLOGICAL: Cranial nerves grossly intact, moves all extremities without gross motor or sensory deficit.           Assessment & Plan:   Problem List Items Addressed This Visit       Cardiovascular and Mediastinum   Essential hypertension - Primary   Relevant Medications   lisinopril  (ZESTRIL ) 10 MG tablet   Other Relevant Orders   Comprehensive metabolic panel with GFR   CBC with Differential/Platelet     Respiratory   Allergic rhinitis     Endocrine   Type 2 diabetes mellitus with hyperglycemia, without long-term current use of insulin  (HCC)   Relevant Medications   lisinopril  (ZESTRIL ) 10 MG tablet   metFORMIN  (GLUCOPHAGE ) 1000 MG tablet   Other Relevant Orders   Urine Microalbumin w/creat. ratio   HM Diabetes Foot Exam (Completed)   Hemoglobin A1c     Other   Bipolar disorder (HCC)   Hypercholesterolemia   Relevant Medications   lisinopril  (ZESTRIL ) 10 MG tablet   Other Relevant Orders   Lipid panel   Class 2 severe obesity with serious comorbidity and body mass index (BMI) of 37.0 to 37.9 in adult [facility])   Relevant Medications   metFORMIN  (GLUCOPHAGE ) 1000 MG tablet   Other Visit Diagnoses       Immunization due       Relevant Orders   Pneumococcal conjugate vaccine 20-valent (Prevnar 20)        Assessment and Plan Assessment & Plan Type 2 diabetes mellitus Type 2 diabetes mellitus with last A1c of 7.3% on December 07, 2022. No recent blood glucose monitoring at home. Due for foot exam, eye exam, and microalbumin urine test. Concerns about toe pain related to diabetes. - Order A1c and microalbumin urine test - Schedule eye exam - Advise to report if toe pain worsens  Hypertension Hypertension with current blood pressure reading of 132/86 mmHg. Currently on lisinopril  10 mg daily a - Continue  lisinopril  10 mg daily - Order lab work to monitor condition  Obesity Obesity with BMI of 38.24 and waist circumference of 45.5 inches. - Encourage continuation of lifestyle modifications, including dietary management and regular exercise. -continue to increase physical activity, getting at least 150 min of physical activity a week.  Work on including [employer], broadcasting/film/video 2 days a week.  - continue eating at a calorie deficit 1600-1700 cal a day, eating a well balanced diet with whole  foods, avoiding processed foods.   Patient is motivated to continue working on lifestyle modification.    Hyperlipidemia Hyperlipidemia with last lipid panel showing triglycerides at 226 mg/dL and LDL at 882 mg/dL. Not currently taking rosuvastatin  due to concerns about family history of side effects. She stopped taking rosuvastatin  out of fear of side effects experienced by her parents, not due to personal intolerance. - Order lab work to reassess lipid levels - Discuss concerns about rosuvastatin   Bipolar disorder Bipolar disorder currently managed with lamotrigine 250 mg daily and methylphenidate 36 mg daily. - Continue lamotrigine 250 mg daily - Continue methylphenidate 36 mg daily  History of toe injury with intermittent pain Intermittent pain at the site of a previous toe injury from four years ago. Pain occurs when flexing the toe and may be related to diabetes. - Advise to report if toe pain worsens        Follow up plan: Return in about 6 months (around 07/08/2024) for follow up, Caitlin Gomez.

## 2024-01-07 ENCOUNTER — Ambulatory Visit: Payer: Self-pay | Admitting: Nurse Practitioner

## 2024-01-07 LAB — CBC WITH DIFFERENTIAL/PLATELET
Absolute Lymphocytes: 2070 {cells}/uL (ref 850–3900)
Absolute Monocytes: 649 {cells}/uL (ref 200–950)
Basophils Absolute: 83 {cells}/uL (ref 0–200)
Basophils Relative: 1.2 %
Eosinophils Absolute: 290 {cells}/uL (ref 15–500)
Eosinophils Relative: 4.2 %
HCT: 37.3 % (ref 35.0–45.0)
Hemoglobin: 12.4 g/dL (ref 11.7–15.5)
MCH: 29.3 pg (ref 27.0–33.0)
MCHC: 33.2 g/dL (ref 32.0–36.0)
MCV: 88.2 fL (ref 80.0–100.0)
MPV: 9.7 fL (ref 7.5–12.5)
Monocytes Relative: 9.4 %
Neutro Abs: 3809 {cells}/uL (ref 1500–7800)
Neutrophils Relative %: 55.2 %
Platelets: 289 Thousand/uL (ref 140–400)
RBC: 4.23 Million/uL (ref 3.80–5.10)
RDW: 12.6 % (ref 11.0–15.0)
Total Lymphocyte: 30 %
WBC: 6.9 Thousand/uL (ref 3.8–10.8)

## 2024-01-07 LAB — COMPREHENSIVE METABOLIC PANEL WITH GFR
AG Ratio: 1.6 (calc) (ref 1.0–2.5)
ALT: 13 U/L (ref 6–29)
AST: 11 U/L (ref 10–30)
Albumin: 4.1 g/dL (ref 3.6–5.1)
Alkaline phosphatase (APISO): 41 U/L (ref 31–125)
BUN: 11 mg/dL (ref 7–25)
CO2: 25 mmol/L (ref 20–32)
Calcium: 9 mg/dL (ref 8.6–10.2)
Chloride: 105 mmol/L (ref 98–110)
Creat: 0.64 mg/dL (ref 0.50–0.99)
Globulin: 2.6 g/dL (ref 1.9–3.7)
Glucose, Bld: 197 mg/dL — ABNORMAL HIGH (ref 65–99)
Potassium: 5 mmol/L (ref 3.5–5.3)
Sodium: 137 mmol/L (ref 135–146)
Total Bilirubin: 0.3 mg/dL (ref 0.2–1.2)
Total Protein: 6.7 g/dL (ref 6.1–8.1)
eGFR: 114 mL/min/1.73m2 (ref >=60)

## 2024-01-07 LAB — LIPID PANEL
Cholesterol: 180 mg/dL (ref <200)
HDL: 39 mg/dL — ABNORMAL LOW (ref >=50)
LDL Cholesterol (Calc): 121 mg/dL — ABNORMAL HIGH
Non-HDL Cholesterol (Calc): 141 mg/dL — ABNORMAL HIGH (ref <130)
Total CHOL/HDL Ratio: 4.6 (calc) (ref <5.0)
Triglycerides: 94 mg/dL (ref <150)

## 2024-01-07 LAB — MICROALBUMIN / CREATININE URINE RATIO
Creatinine, Urine: 46 mg/dL (ref 20–275)
Microalb Creat Ratio: 7 mg/g{creat} (ref <30)
Microalb, Ur: 0.3 mg/dL

## 2024-01-07 LAB — HEMOGLOBIN A1C
Hgb A1c MFr Bld: 7.7 % — ABNORMAL HIGH (ref <5.7)
Mean Plasma Glucose: 174 mg/dL
eAG (mmol/L): 9.7 mmol/L

## 2024-07-01 ENCOUNTER — Other Ambulatory Visit: Payer: Self-pay | Admitting: Nurse Practitioner

## 2024-07-01 DIAGNOSIS — E1165 Type 2 diabetes mellitus with hyperglycemia: Secondary | ICD-10-CM

## 2024-07-01 DIAGNOSIS — I10 Essential (primary) hypertension: Secondary | ICD-10-CM

## 2024-07-02 NOTE — Telephone Encounter (Unsigned)
 Copied from CRM #[id number]. Topic: Appointments - Scheduling Inquiry for Clinic >> Jul 02, 2024 12:49 PM Joesph B wrote: Reason for CRM: patient needed a TOC. I scheduled her TOC with julie but she needs her medications refilled. Is there anyway pt can be seen sooner? Please fu with patient.

## 2024-07-02 NOTE — Telephone Encounter (Signed)
 Courtesy refill. Patient will need an office visit for additional refills.  Requested Prescriptions  Pending Prescriptions Disp Refills   metFORMIN  (GLUCOPHAGE ) 1000 MG tablet [Pharmacy Med Name: METFORMIN  HYDROCHLORIDE 1000MG  TABLET] 60 tablet 0    Sig: TAKE 1 TABLET (1,000 MG TOTAL) BY MOUTH 2 (TWO) TIMES DAILY WITH A MEAL.     Endocrinology:  Diabetes - Biguanides Failed - 07/02/2024 12:37 PM      Failed - B12 Level in normal range and within 720 days    No results found for: VITAMINB12       Passed - Cr in normal range and within 360 days    Creat  Date Value Ref Range Status  01/06/2024 0.64 0.50 - 0.99 mg/dL Final   Creatinine, Urine  Date Value Ref Range Status  01/06/2024 46 20 - 275 mg/dL Final         Passed - HBA1C is between 0 and 7.9 and within 180 days    Hgb A1c MFr Bld  Date Value Ref Range Status  01/06/2024 7.7 (H) <5.7 % Final    Comment:    For someone without known diabetes, a hemoglobin A1c value of 6.5% or greater indicates that they may have  diabetes and this should be confirmed with a follow-up  test. . For someone with known diabetes, a value <7% indicates  that their diabetes is well controlled and a value  greater than or equal to 7% indicates suboptimal  control. A1c targets should be individualized based on  duration of diabetes, age, comorbid conditions, and  other considerations. . Currently, no consensus exists regarding use of hemoglobin A1c for diagnosis of diabetes for children. .          Passed - eGFR in normal range and within 360 days    GFR, Est African American  Date Value Ref Range Status  07/18/2020 130 > OR = 60 mL/min/1.48m2 Final   GFR, Est Non African American  Date Value Ref Range Status  07/18/2020 112 > OR = 60 mL/min/1.76m2 Final   GFR  Date Value Ref Range Status  12/18/2018 102.95 >60.00 mL/min Final   eGFR  Date Value Ref Range Status  01/06/2024 114 > OR = 60 mL/min/1.60m2 Final         Passed -  Valid encounter within last 6 months    Recent Outpatient Visits           5 months ago Essential hypertension   [facility] Gareth Mliss [place], FNP              Passed - CBC within normal limits and completed in the last 12 months    WBC  Date Value Ref Range Status  01/06/2024 6.9 3.8 - 10.8 Thousand/uL Final   RBC  Date Value Ref Range Status  01/06/2024 4.23 3.80 - 5.10 Million/uL Final   Hemoglobin  Date Value Ref Range Status  01/06/2024 12.4 11.7 - 15.5 g/dL Final   HGB  Date Value Ref Range Status  11/21/2012 14.5 12.0 - 16.0 g/dL Final   HCT  Date Value Ref Range Status  01/06/2024 37.3 35.0 - 45.0 % Final  11/21/2012 40.9 35.0 - 47.0 % Final   MCHC  Date Value Ref Range Status  01/06/2024 33.2 32.0 - 36.0 g/dL Final    Comment:    For adults, a slight decrease in the calculated MCHC value (in the range of 30 to 32 g/dL) is most likely not clinically  significant; however, it should be interpreted with caution in correlation with other red cell parameters and the patient's clinical condition.    [facility]  Date Value Ref Range Status  01/06/2024 29.3 27.0 - 33.0 pg Final   MCV  Date Value Ref Range Status  01/06/2024 88.2 80.0 - 100.0 fL Final  11/21/2012 84 80 - 100 fL Final   No results found for: PLTCOUNTKUC, LABPLAT, POCPLA RDW  Date Value Ref Range Status  01/06/2024 12.6 11.0 - 15.0 % Final  11/21/2012 12.9 11.5 - 14.5 % Final          lisinopril  (ZESTRIL ) 10 MG tablet [Pharmacy Med Name: LISINOPRIL  10MG  TABLET] 30 tablet 0    Sig: TAKE 1 TABLET (10 MG TOTAL) BY MOUTH DAILY.     Cardiovascular:  ACE Inhibitors Passed - 07/02/2024 12:37 PM      Passed - Cr in normal range and within 180 days    Creat  Date Value Ref Range Status  01/06/2024 0.64 0.50 - 0.99 mg/dL Final   Creatinine, Urine  Date Value Ref Range Status  01/06/2024 46 20 - 275 mg/dL Final         Passed - K in normal range and within 180  days    Potassium  Date Value Ref Range Status  01/06/2024 5.0 3.5 - 5.3 mmol/L Final  11/21/2012 3.5 3.5 - 5.1 mmol/L Final         Passed - Patient is not pregnant      Passed - Last BP in normal range    BP Readings from Last 1 Encounters:  01/06/24 138/84         Passed - Valid encounter within last 6 months    Recent Outpatient Visits           5 months ago Essential hypertension   [facility] Gareth Mliss [place], [place]

## 2024-07-09 ENCOUNTER — Ambulatory Visit: Admitting: Family Medicine

## 2024-08-06 ENCOUNTER — Encounter: Admitting: Nurse Practitioner
